# Patient Record
Sex: Female | Born: 1970 | Race: White | Hispanic: Yes | Marital: Single | Smoking: Never smoker
Health system: Southern US, Community
[De-identification: ages and names within clinical notes are randomized; demographics above are authoritative.]

## PROBLEM LIST (undated history)

## (undated) DIAGNOSIS — D259 Leiomyoma of uterus, unspecified: Secondary | ICD-10-CM

## (undated) DIAGNOSIS — E785 Hyperlipidemia, unspecified: Secondary | ICD-10-CM

## (undated) DIAGNOSIS — K219 Gastro-esophageal reflux disease without esophagitis: Secondary | ICD-10-CM

## (undated) DIAGNOSIS — I10 Essential (primary) hypertension: Secondary | ICD-10-CM

## (undated) DIAGNOSIS — N200 Calculus of kidney: Secondary | ICD-10-CM

## (undated) HISTORY — DX: Calculus of kidney: N20.0

## (undated) HISTORY — DX: Leiomyoma of uterus, unspecified: D25.9

## (undated) HISTORY — DX: Hyperlipidemia, unspecified: E78.5

## (undated) HISTORY — DX: Gastro-esophageal reflux disease without esophagitis: K21.9

---

## 2007-12-11 ENCOUNTER — Ambulatory Visit (HOSPITAL_COMMUNITY): Admission: RE | Admit: 2007-12-11 | Discharge: 2007-12-11 | Payer: Self-pay | Admitting: Family Medicine

## 2009-02-04 ENCOUNTER — Emergency Department (HOSPITAL_COMMUNITY): Admission: EM | Admit: 2009-02-04 | Discharge: 2009-02-04 | Payer: Self-pay | Admitting: Emergency Medicine

## 2010-01-31 ENCOUNTER — Emergency Department (HOSPITAL_COMMUNITY): Admission: EM | Admit: 2010-01-31 | Discharge: 2010-02-01 | Payer: Self-pay | Admitting: Emergency Medicine

## 2010-04-06 ENCOUNTER — Ambulatory Visit (HOSPITAL_COMMUNITY): Admission: RE | Admit: 2010-04-06 | Discharge: 2010-04-06 | Payer: Self-pay | Admitting: Family Medicine

## 2011-03-13 LAB — URINE MICROSCOPIC-ADD ON

## 2011-03-13 LAB — URINALYSIS, ROUTINE W REFLEX MICROSCOPIC
Bilirubin Urine: NEGATIVE
Glucose, UA: NEGATIVE mg/dL
Nitrite: NEGATIVE
pH: 5.5 (ref 5.0–8.0)

## 2012-12-02 ENCOUNTER — Other Ambulatory Visit (HOSPITAL_COMMUNITY): Payer: Self-pay | Admitting: Nurse Practitioner

## 2012-12-02 DIAGNOSIS — Z139 Encounter for screening, unspecified: Secondary | ICD-10-CM

## 2012-12-07 ENCOUNTER — Ambulatory Visit (HOSPITAL_COMMUNITY): Payer: Self-pay

## 2012-12-07 ENCOUNTER — Ambulatory Visit (HOSPITAL_COMMUNITY)
Admission: RE | Admit: 2012-12-07 | Discharge: 2012-12-07 | Disposition: A | Discharge status: Home / Self Care | Payer: PRIVATE HEALTH INSURANCE | Source: Ambulatory Visit | Attending: Nurse Practitioner | Admitting: Nurse Practitioner

## 2012-12-07 DIAGNOSIS — Z139 Encounter for screening, unspecified: Secondary | ICD-10-CM

## 2012-12-11 ENCOUNTER — Other Ambulatory Visit: Payer: Self-pay | Admitting: Nurse Practitioner

## 2012-12-11 DIAGNOSIS — R928 Other abnormal and inconclusive findings on diagnostic imaging of breast: Secondary | ICD-10-CM

## 2013-01-06 ENCOUNTER — Other Ambulatory Visit (HOSPITAL_COMMUNITY): Payer: Self-pay | Admitting: Nurse Practitioner

## 2013-01-06 ENCOUNTER — Ambulatory Visit (HOSPITAL_COMMUNITY)
Admission: RE | Admit: 2013-01-06 | Discharge: 2013-01-06 | Disposition: A | Discharge status: Home / Self Care | Payer: PRIVATE HEALTH INSURANCE | Source: Ambulatory Visit | Attending: Nurse Practitioner | Admitting: Nurse Practitioner

## 2013-01-06 DIAGNOSIS — R928 Other abnormal and inconclusive findings on diagnostic imaging of breast: Secondary | ICD-10-CM

## 2013-01-13 ENCOUNTER — Encounter (HOSPITAL_COMMUNITY): Payer: Self-pay

## 2016-10-20 ENCOUNTER — Emergency Department (HOSPITAL_COMMUNITY): Payer: Managed Care, Other (non HMO)

## 2016-10-20 ENCOUNTER — Encounter (HOSPITAL_COMMUNITY): Payer: Self-pay

## 2016-10-20 ENCOUNTER — Emergency Department (HOSPITAL_COMMUNITY)
Admission: EM | Admit: 2016-10-20 | Discharge: 2016-10-20 | Disposition: A | Discharge status: Home / Self Care | Payer: Managed Care, Other (non HMO) | Attending: Emergency Medicine | Admitting: Emergency Medicine

## 2016-10-20 DIAGNOSIS — R51 Headache: Secondary | ICD-10-CM | POA: Diagnosis present

## 2016-10-20 DIAGNOSIS — R05 Cough: Secondary | ICD-10-CM | POA: Insufficient documentation

## 2016-10-20 DIAGNOSIS — I10 Essential (primary) hypertension: Secondary | ICD-10-CM | POA: Insufficient documentation

## 2016-10-20 DIAGNOSIS — R059 Cough, unspecified: Secondary | ICD-10-CM

## 2016-10-20 DIAGNOSIS — R519 Headache, unspecified: Secondary | ICD-10-CM

## 2016-10-20 HISTORY — DX: Essential (primary) hypertension: I10

## 2016-10-20 LAB — CBC WITH DIFFERENTIAL/PLATELET
Basophils Absolute: 0 10*3/uL (ref 0.0–0.1)
Basophils Relative: 0 %
EOS ABS: 0.1 10*3/uL (ref 0.0–0.7)
EOS PCT: 2 %
HCT: 39.2 % (ref 36.0–46.0)
Hemoglobin: 13.2 g/dL (ref 12.0–15.0)
LYMPHS ABS: 2.6 10*3/uL (ref 0.7–4.0)
LYMPHS PCT: 38 %
MCH: 29.9 pg (ref 26.0–34.0)
MCHC: 33.7 g/dL (ref 30.0–36.0)
MCV: 88.7 fL (ref 78.0–100.0)
MONO ABS: 0.5 10*3/uL (ref 0.1–1.0)
Monocytes Relative: 8 %
Neutro Abs: 3.7 10*3/uL (ref 1.7–7.7)
Neutrophils Relative %: 52 %
PLATELETS: 224 10*3/uL (ref 150–400)
RBC: 4.42 MIL/uL (ref 3.87–5.11)
RDW: 13.2 % (ref 11.5–15.5)
WBC: 6.9 10*3/uL (ref 4.0–10.5)

## 2016-10-20 LAB — BASIC METABOLIC PANEL
Anion gap: 8 (ref 5–15)
BUN: 9 mg/dL (ref 6–20)
CALCIUM: 9.2 mg/dL (ref 8.9–10.3)
CO2: 25 mmol/L (ref 22–32)
CREATININE: 0.67 mg/dL (ref 0.44–1.00)
Chloride: 105 mmol/L (ref 101–111)
GFR calc Af Amer: >60 mL/min (ref >60)
GLUCOSE: 107 mg/dL — AB (ref 65–99)
Potassium: 3.8 mmol/L (ref 3.5–5.1)
SODIUM: 138 mmol/L (ref 135–145)

## 2016-10-20 MED ORDER — PREDNISONE 20 MG PO TABS
60.0000 mg | ORAL_TABLET | ORAL | Status: AC
  Administered 2016-10-20: 60 mg via ORAL
  Filled 2016-10-20: qty 3

## 2016-10-20 MED ORDER — PREDNISONE 20 MG PO TABS: 40.0000 mg | ORAL_TABLET | Freq: Every day | ORAL | 0 refills | Status: DC

## 2016-10-20 NOTE — ED Triage Notes (Signed)
Pt complaining of dry cough x 2 weeks. Pt also complaining of headache due to htn. Pt with taking htn medication x 3 months, no improvement. Pt complaining of ringing in ears and occasional dizziness.

## 2016-10-20 NOTE — Discharge Instructions (Signed)
As discussed, your evaluation today has been largely reassuring.  But, it is important that you monitor your condition carefully, and do not hesitate to return to the ED if you develop new, or concerning changes in your condition. ? ?Otherwise, please follow-up with your physician for appropriate ongoing care. ? ?

## 2016-10-20 NOTE — ED Provider Notes (Signed)
[facility] DEPT Provider Note   CSN: [id number] Arrival date & time: 10/20/16  1845     History   Chief Complaint Chief Complaint  Patient presents with  . Hypertension  . Headache    HPI Sylvia Nguyen is a 45 y.o. female.  HPI  Reason presents with multiple complaints. She notes that she was generally well until about 3 months ago. About that time she started feeling generalized complaints including full sensation in her head, ears, generalized discomfort. Over the past 2 weeks she has also developed cough, persistent. No fever, no syncope, no chest pain. Mild associated nausea, but no vomiting. Patient went to an urgent care last week, was diagnosed with possible bronchitis. Patient also has become aware of persistently elevated blood pressure. During her last urgent care visit she was started on hydrochlorothiazide. She states that she takes this regularly. Patient is here with 2 children who assist with history of present illness, corroborate the history. Patient also has a notable change of recent arrival here from [place] one week ago. Patient denies other medical problems.   Past Medical History:  Diagnosis Date  . Hypertension     There are no active problems to display for this patient.   History reviewed. No pertinent surgical history.  OB History    No data available       Home Medications    Prior to Admission medications   Not on File    Family History History reviewed. No pertinent family history.  Social History Social History  Substance Use Topics  . Smoking status: Never Smoker  . Smokeless tobacco: Never Used  . Alcohol use No     Allergies   Review of patient's allergies indicates not on file.   Review of Systems Review of Systems  Constitutional:       Per HPI, otherwise negative  HENT:       Per HPI, otherwise negative  Respiratory: Positive for cough. Negative for shortness of breath.   Cardiovascular:   Per HPI, otherwise negative  Gastrointestinal: Negative for vomiting.  Endocrine:       Negative aside from HPI  Genitourinary:       Neg aside from HPI   Musculoskeletal:       Per HPI, otherwise negative  Skin: Negative.   Neurological: Positive for headaches. Negative for syncope.     Physical Exam Updated Vital Signs BP 126/77   Pulse (!) 55   Temp 98.3 F (36.8 C) (Oral)   Resp 20   SpO2 100%   Physical Exam  Constitutional: She is oriented to person, place, and time. She appears well-developed and well-nourished. No distress.  HENT:  Head: Normocephalic and atraumatic.  Right Ear: Hearing, tympanic membrane and ear canal normal.  Left Ear: Hearing, tympanic membrane and ear canal normal.  Eyes: Conjunctivae and EOM are normal.  Cardiovascular: Normal rate and regular rhythm.   Pulmonary/Chest: No stridor. She has decreased breath sounds.  Abdominal: She exhibits no distension.  Musculoskeletal: She exhibits no edema.  Neurological: She is alert and oriented to person, place, and time. No cranial nerve deficit.  Skin: Skin is warm and dry.  Psychiatric: She has a normal mood and affect.  Nursing note and vitals reviewed.    ED Treatments / Results  Labs (all labs ordered are listed, but only abnormal results are displayed) Labs Reviewed  CBC WITH DIFFERENTIAL/PLATELET  BASIC METABOLIC PANEL    EKG  EKG Interpretation  Date/Time:  Sunday  October 20 2016 19:37:38 EDT Ventricular Rate:  59 PR Interval:    QRS Duration: 100 QT Interval:  436 QTC Calculation: 432 R Axis:   65 Text Interpretation:  Sinus rhythm Baseline wander Borderline ECG Confirmed by Carmin Muskrat  MD ([id number]) on 10/20/2016 8:07:46 PM       Radiology No results found.  Procedures Procedures (including critical care time)  Medications Ordered in ED Medications - No data to display   Initial Impression / Assessment and Plan / ED Course  I have reviewed the triage vital signs  and the nursing notes.  Pertinent labs & imaging results that were available during my care of the patient were reviewed by me and considered in my medical decision making (see chart for details).  Clinical Course    10:15 PM On repeat exam the patient is in no distress, awake, alert, blood pressure normal. I discussed all findings the patient and her children. Discussed the reassuring labs, EKG, x-ray, normal blood pressure. Patient has no primary care physician, but I emphasized the importance of following up with our referral to make an appointment for later this week after taking medication for the next few days. This patient presents with ongoing multiple complaints. Patient has had ongoing cough, there is some suspicion for bronchitis, but no evidence for pneumonia on x-ray. Here the patient is no evidence for end organ effects of hypertension, and blood pressure normalizes during her hospital stay. Patient is otherwise well-appearing, with no neurologic deficits, low suspicion for other acute new pathology. Patient started on a short course of steroids for possible bronchitis, discharge with references to follow-up with primary care.     Carmin Muskrat, MD 10/20/16 2216

## 2016-10-20 NOTE — ED Notes (Signed)
Taken to xray at this time. 

## 2017-01-01 ENCOUNTER — Telehealth (HOSPITAL_COMMUNITY): Payer: Self-pay | Admitting: Family Medicine

## 2017-01-02 NOTE — Telephone Encounter (Signed)
01/01/17 Called pt and lmsg for her to Cb to get scheduled for an echo     By Verdene Rio

## 2017-01-06 ENCOUNTER — Telehealth (HOSPITAL_COMMUNITY): Payer: Self-pay | Admitting: Family Medicine

## 2017-01-16 NOTE — Telephone Encounter (Signed)
01/06/17 Left Message - Called pt and lmsg for her to CB to get scheduled for echo.     By Verdene Rio

## 2017-01-17 ENCOUNTER — Telehealth (HOSPITAL_COMMUNITY): Payer: Self-pay | Admitting: Family Medicine

## 2017-01-17 NOTE — Telephone Encounter (Signed)
01/17/17 Left Message - Called pt and lmsg for her to CB.Marland KitchenRG     By Verdene Rio

## 2017-06-15 ENCOUNTER — Encounter (HOSPITAL_COMMUNITY): Payer: Self-pay

## 2017-06-15 DIAGNOSIS — I10 Essential (primary) hypertension: Secondary | ICD-10-CM | POA: Insufficient documentation

## 2017-06-15 DIAGNOSIS — Z79899 Other long term (current) drug therapy: Secondary | ICD-10-CM | POA: Insufficient documentation

## 2017-06-15 DIAGNOSIS — M5432 Sciatica, left side: Secondary | ICD-10-CM | POA: Insufficient documentation

## 2017-06-15 NOTE — ED Triage Notes (Signed)
Pt complaining of lower back pain that radiates down L leg x 1 week. Pt states some numbness/tingling. Pt denies any injury/trauma.

## 2017-06-16 ENCOUNTER — Emergency Department (HOSPITAL_COMMUNITY)
Admission: EM | Admit: 2017-06-16 | Discharge: 2017-06-16 | Disposition: A | Discharge status: Home / Self Care | Payer: Self-pay | Attending: Emergency Medicine | Admitting: Emergency Medicine

## 2017-06-16 ENCOUNTER — Emergency Department (HOSPITAL_COMMUNITY): Payer: Self-pay

## 2017-06-16 ENCOUNTER — Encounter (HOSPITAL_COMMUNITY): Payer: Self-pay | Admitting: Emergency Medicine

## 2017-06-16 DIAGNOSIS — R531 Weakness: Secondary | ICD-10-CM | POA: Insufficient documentation

## 2017-06-16 DIAGNOSIS — M5432 Sciatica, left side: Secondary | ICD-10-CM

## 2017-06-16 DIAGNOSIS — R52 Pain, unspecified: Secondary | ICD-10-CM

## 2017-06-16 DIAGNOSIS — Z79899 Other long term (current) drug therapy: Secondary | ICD-10-CM | POA: Insufficient documentation

## 2017-06-16 DIAGNOSIS — I10 Essential (primary) hypertension: Secondary | ICD-10-CM | POA: Insufficient documentation

## 2017-06-16 DIAGNOSIS — R935 Abnormal findings on diagnostic imaging of other abdominal regions, including retroperitoneum: Secondary | ICD-10-CM | POA: Insufficient documentation

## 2017-06-16 DIAGNOSIS — M549 Dorsalgia, unspecified: Secondary | ICD-10-CM

## 2017-06-16 DIAGNOSIS — F419 Anxiety disorder, unspecified: Secondary | ICD-10-CM | POA: Insufficient documentation

## 2017-06-16 DIAGNOSIS — M5417 Radiculopathy, lumbosacral region: Secondary | ICD-10-CM | POA: Insufficient documentation

## 2017-06-16 LAB — COMPREHENSIVE METABOLIC PANEL
ALBUMIN: 3.7 g/dL (ref 3.5–5.0)
ALK PHOS: 69 U/L (ref 38–126)
ALT: 14 U/L (ref 14–54)
ANION GAP: 10 (ref 5–15)
AST: 25 U/L (ref 15–41)
BILIRUBIN TOTAL: 0.6 mg/dL (ref 0.3–1.2)
BUN: 10 mg/dL (ref 6–20)
CO2: 24 mmol/L (ref 22–32)
Calcium: 8.1 mg/dL — ABNORMAL LOW (ref 8.9–10.3)
Chloride: 102 mmol/L (ref 101–111)
Creatinine, Ser: 0.62 mg/dL (ref 0.44–1.00)
GFR calc non Af Amer: >60 mL/min (ref >60)
GLUCOSE: 120 mg/dL — AB (ref 65–99)
POTASSIUM: 3.3 mmol/L — AB (ref 3.5–5.1)
SODIUM: 136 mmol/L (ref 135–145)
TOTAL PROTEIN: 6.5 g/dL (ref 6.5–8.1)

## 2017-06-16 LAB — I-STAT CHEM 8, ED
BUN: 13 mg/dL (ref 6–20)
CHLORIDE: 101 mmol/L (ref 101–111)
Calcium, Ion: 1.11 mmol/L — ABNORMAL LOW (ref 1.15–1.40)
Creatinine, Ser: 0.6 mg/dL (ref 0.44–1.00)
Glucose, Bld: 114 mg/dL — ABNORMAL HIGH (ref 65–99)
HEMATOCRIT: 34 % — AB (ref 36.0–46.0)
Hemoglobin: 11.6 g/dL — ABNORMAL LOW (ref 12.0–15.0)
POTASSIUM: 3.8 mmol/L (ref 3.5–5.1)
SODIUM: 139 mmol/L (ref 135–145)
TCO2: 26 mmol/L (ref 0–100)

## 2017-06-16 LAB — CBC WITH DIFFERENTIAL/PLATELET
BASOS PCT: 0 %
Basophils Absolute: 0 10*3/uL (ref 0.0–0.1)
EOS ABS: 0 10*3/uL (ref 0.0–0.7)
Eosinophils Relative: 1 %
HCT: 35.8 % — ABNORMAL LOW (ref 36.0–46.0)
Hemoglobin: 12.2 g/dL (ref 12.0–15.0)
LYMPHS ABS: 1.6 10*3/uL (ref 0.7–4.0)
LYMPHS PCT: 26 %
MCH: 29.7 pg (ref 26.0–34.0)
MCHC: 34.1 g/dL (ref 30.0–36.0)
MCV: 87.1 fL (ref 78.0–100.0)
MONO ABS: 0.3 10*3/uL (ref 0.1–1.0)
MONOS PCT: 5 %
NEUTROS ABS: 4.3 10*3/uL (ref 1.7–7.7)
Neutrophils Relative %: 68 %
Platelets: 209 10*3/uL (ref 150–400)
RBC: 4.11 MIL/uL (ref 3.87–5.11)
RDW: 12.6 % (ref 11.5–15.5)
WBC: 6.2 10*3/uL (ref 4.0–10.5)

## 2017-06-16 LAB — URINALYSIS, ROUTINE W REFLEX MICROSCOPIC
Bilirubin Urine: NEGATIVE
GLUCOSE, UA: NEGATIVE mg/dL
Hgb urine dipstick: NEGATIVE
KETONES UR: NEGATIVE mg/dL
LEUKOCYTES UA: NEGATIVE
Nitrite: NEGATIVE
PROTEIN: NEGATIVE mg/dL
Specific Gravity, Urine: 1.004 — ABNORMAL LOW (ref 1.005–1.030)
pH: 7 (ref 5.0–8.0)

## 2017-06-16 LAB — I-STAT TROPONIN, ED: Troponin i, poc: 0 ng/mL (ref 0.00–0.08)

## 2017-06-16 LAB — I-STAT BETA HCG BLOOD, ED (MC, WL, AP ONLY)

## 2017-06-16 LAB — PREGNANCY, URINE: Preg Test, Ur: NEGATIVE

## 2017-06-16 LAB — LIPASE, BLOOD: Lipase: 26 U/L (ref 11–51)

## 2017-06-16 MED ORDER — KETOROLAC TROMETHAMINE 30 MG/ML IJ SOLN
30.0000 mg | Freq: Once | INTRAMUSCULAR | Status: AC
  Administered 2017-06-16: 30 mg via INTRAVENOUS
  Filled 2017-06-16: qty 1

## 2017-06-16 MED ORDER — PROMETHAZINE HCL 25 MG/ML IJ SOLN
25.0000 mg | Freq: Once | INTRAMUSCULAR | Status: AC
  Administered 2017-06-16: 25 mg via INTRAVENOUS
  Filled 2017-06-16: qty 1

## 2017-06-16 MED ORDER — ONDANSETRON HCL 4 MG/2ML IJ SOLN
4.0000 mg | Freq: Once | INTRAMUSCULAR | Status: AC
  Administered 2017-06-16: 4 mg via INTRAVENOUS
  Filled 2017-06-16: qty 2

## 2017-06-16 MED ORDER — PREDNISONE 20 MG PO TABS: 40.0000 mg | ORAL_TABLET | Freq: Every day | ORAL | 0 refills | Status: DC

## 2017-06-16 MED ORDER — SODIUM CHLORIDE 0.9 % IV BOLUS (SEPSIS)
1000.0000 mL | Freq: Once | INTRAVENOUS | Status: AC
  Administered 2017-06-16: 1000 mL via INTRAVENOUS

## 2017-06-16 MED ORDER — CYCLOBENZAPRINE HCL 10 MG PO TABS: 10.0000 mg | ORAL_TABLET | Freq: Two times a day (BID) | ORAL | 0 refills | Status: DC | PRN

## 2017-06-16 MED ORDER — PROMETHAZINE HCL 25 MG PO TABS: 25.0000 mg | ORAL_TABLET | Freq: Four times a day (QID) | ORAL | 0 refills | Status: DC | PRN

## 2017-06-16 MED ORDER — LORAZEPAM 2 MG/ML IJ SOLN
0.5000 mg | Freq: Once | INTRAMUSCULAR | Status: AC
  Administered 2017-06-16: 0.5 mg via INTRAVENOUS
  Filled 2017-06-16: qty 1

## 2017-06-16 MED ORDER — IOPAMIDOL (ISOVUE-300) INJECTION 61%
INTRAVENOUS | Status: AC
  Administered 2017-06-16: 100 mL
  Filled 2017-06-16: qty 100

## 2017-06-16 MED ORDER — HYDROCODONE-ACETAMINOPHEN 5-325 MG PO TABS
2.0000 | ORAL_TABLET | Freq: Once | ORAL | Status: AC
  Administered 2017-06-16: 2 via ORAL
  Filled 2017-06-16: qty 2

## 2017-06-16 MED ORDER — CYCLOBENZAPRINE HCL 5 MG PO TABS: 5.0000 mg | ORAL_TABLET | Freq: Three times a day (TID) | ORAL | 0 refills | Status: DC | PRN

## 2017-06-16 NOTE — ED Triage Notes (Signed)
Pt brought to ED by [facility] for c/o cp, HA and SOB, pt just dc home few hours ago for back pain, when requesting on a pain scale how is her pain, pt refuses to answer and just started crying, CBG by EMS 117, HR 60, BP 168/82, SPO2 100% on RA. Pt is spanish speaker only, resting on bed NAD noticed.

## 2017-06-16 NOTE — ED Provider Notes (Signed)
[facility] DEPT Provider Note   CSN: [id number] Arrival date & time: 06/16/17  0442     History   Chief Complaint Chief Complaint  Patient presents with  . Chest Pain  . Weakness  . Headache    HPI Sylvia Nguyen is a 46 y.o. female.  The history is provided by the patient and a relative. A language interpreter was used [id number]).  Patient presents for re-evaluation History obtained via spanish interpreter video as well as daughter Pt was seen in the ED in the night of 6/24 (several hrs ago) for back pain She was given vicodin, felt improved and was discharged Soon after leaving, she started having nausea/vomiting She reports her whole body became numb She reports shortness of breath She report her heart "hurt"  She reports generalized weakness She reports tinnitus No syncope No falls No incontinence She had been seen for low back pain which improved She thinks it may have been related to the meds she was given in the ED  Past Medical History:  Diagnosis Date  . Hypertension     There are no active problems to display for this patient.   History reviewed. No pertinent surgical history.  OB History    No data available       Home Medications    Prior to Admission medications   Medication Sig Start Date End Date Taking? Authorizing Provider  cyclobenzaprine (FLEXERIL) 10 MG tablet Take 1 tablet (10 mg total) by mouth 2 (two) times daily as needed for muscle spasms. 06/16/17   Montine Circle, PA-C  predniSONE (DELTASONE) 20 MG tablet Take 2 tablets (40 mg total) by mouth daily. 06/16/17   Montine Circle, PA-C    Family History History reviewed. No pertinent family history.  Social History Social History  Substance Use Topics  . Smoking status: Never Smoker  . Smokeless tobacco: Never Used  . Alcohol use No     Allergies   Patient has no known allergies.   Review of Systems Review of Systems  Constitutional: Negative for fever.  HENT:  Positive for tinnitus.   Gastrointestinal: Positive for nausea and vomiting.  Psychiatric/Behavioral: The patient is nervous/anxious.   All other systems reviewed and are negative.    Physical Exam Updated Vital Signs BP 115/75   Pulse 63   Temp 98.5 F (36.9 C) (Oral)   Resp (!) 22   Ht 1.575 m (5\' 2" )   Wt 61.2 kg (135 lb)   SpO2 100%   BMI 24.69 kg/m   Physical Exam CONSTITUTIONAL: mildly anxious HEAD: Normocephalic/atraumatic EYES: EOMI/PERRL ENMT: Mucous membranes moist NECK: supple no meningeal signs SPINE/BACK:entire spine nontender CV: S1/S2 noted, no murmurs/rubs/gallops noted LUNGS: Lungs are clear to auscultation bilaterally, no apparent distress ABDOMEN: soft, nontender, no rebound or guarding, bowel sounds noted throughout abdomen GU:no cva tenderness NEURO: Pt is awake/alert/appropriate, moves all extremitiesx4.  No facial droop.  No arm/leg drift EXTREMITIES: pulses normal/equal, full ROM SKIN: warm, color normal PSYCH: anxious  ED Treatments / Results  Labs (all labs ordered are listed, but only abnormal results are displayed) Labs Reviewed  I-STAT CHEM 8, ED - Abnormal; Notable for the following:       Result Value   Glucose, Bld 114 (*)    Calcium, Ion 1.11 (*)    Hemoglobin 11.6 (*)    HCT 34.0 (*)    All other components within normal limits  Randolm Idol, ED    EKG  EKG Interpretation  Date/Time:  Monday  June 16 2017 05:04:02 EDT Ventricular Rate:  61 PR Interval:    QRS Duration: 109 QT Interval:  452 QTC Calculation: 456 R Axis:   73 Text Interpretation:  Sinus rhythm RSR' in V1 or V2, right VCD or RVH No significant change since last tracing Confirmed by Ripley Fraise [phone]) on 06/16/2017 5:10:13 AM       Radiology Dg Hip Unilat With Pelvis 2-3 Views Left  Result Date: 06/16/2017 CLINICAL DATA:  Lower back and left-sided hip pain tonight. No trauma. EXAM: DG HIP (WITH OR WITHOUT PELVIS) 2-3V LEFT COMPARISON:  None.  FINDINGS: There is no evidence of hip fracture or dislocation. There is no evidence of arthropathy or other focal bone abnormality. IMPRESSION: Negative. Electronically Signed   By: Andreas Newport M.D.   On: 06/16/2017 02:27    Procedures Procedures   Medications Ordered in ED Medications  sodium chloride 0.9 % bolus 1,000 mL (not administered)  ondansetron (ZOFRAN) injection 4 mg (not administered)  ketorolac (TORADOL) 30 MG/ML injection 30 mg (not administered)  ondansetron (ZOFRAN) injection 4 mg (4 mg Intravenous Given 06/16/17 0636)  sodium chloride 0.9 % bolus 1,000 mL (1,000 mLs Intravenous New Bag/Given 06/16/17 0635)  LORazepam (ATIVAN) injection 0.5 mg (0.5 mg Intravenous Given 06/16/17 0636)     Initial Impression / Assessment and Plan / ED Course  I have reviewed the triage vital signs and the nursing notes.  Pertinent labs results that were available during my care of the patient were reviewed by me and considered in my medical decision making (see chart for details).     7:20 AM Pt with medication reaction after receiving vicodin on previous ED visit She had vomiting/felt anxious/short of breath/body numbness on the car ride home She did not respond to initial meds She is still holding emesis bag Will give another round of meds/fluids She reports back pain is actually better and no red flags on exam  At signout to dr Darl Householder, have patient take PO If she can ambulate/take PO she will be stable for d/c home Would advise to not take prednisone/flexeril   Final Clinical Impressions(s) / ED Diagnoses   Final diagnoses:  Anxiety  Generalized weakness    New Prescriptions New Prescriptions   No medications on file     Ripley Fraise, MD 06/16/17 [phone]

## 2017-06-16 NOTE — ED Provider Notes (Signed)
[facility] DEPT Provider Note   CSN: [id number] Arrival date & time: 06/15/17  2315     History   Chief Complaint Chief Complaint  Patient presents with  . Back Pain    HPI Sylvia Nguyen is a 46 y.o. female.  Patient presents to the emergency department with chief complaint of left hip pain. She states symptoms started about a week ago. She states pain is in her left buttock and radiates down the back of her leg. She denies any associated fevers, or chills. She reports no bowel or bladder incontinence. Denies any saddle anesthesia. She states that when the pain is severe, she is unable to walk because of pain. She has never had this problem before. She denies any falls or traumatic injuries. There are no other associated symptoms.   The history is provided by the patient. No language interpreter was used.    Past Medical History:  Diagnosis Date  . Hypertension     There are no active problems to display for this patient.   History reviewed. No pertinent surgical history.  OB History    No data available       Home Medications    Prior to Admission medications   Medication Sig Start Date End Date Taking? Authorizing Provider  predniSONE (DELTASONE) 20 MG tablet Take 2 tablets (40 mg total) by mouth daily with breakfast. For the next four days 10/20/16   Carmin Muskrat, MD    Family History History reviewed. No pertinent family history.  Social History Social History  Substance Use Topics  . Smoking status: Never Smoker  . Smokeless tobacco: Never Used  . Alcohol use No     Allergies   Patient has no allergy information on record.   Review of Systems Review of Systems  Constitutional: Negative for chills and fever.  Gastrointestinal:       No bowel incontinence  Genitourinary:       No urinary incontinence  Musculoskeletal: Positive for arthralgias, back pain and myalgias.  Neurological:       No saddle anesthesia     Physical  Exam Updated Vital Signs BP (!) 149/78 (BP Location: Right Arm)   Pulse (!) 53   Temp 98.5 F (36.9 C) (Oral)   Resp 17   SpO2 100%   Physical Exam  Physical Exam  Constitutional: Pt appears well-developed and well-nourished. No distress.  HENT:  Head: Normocephalic and atraumatic.  Mouth/Throat: Oropharynx is clear and moist. No oropharyngeal exudate.  Eyes: Conjunctivae are normal.  Neck: Normal range of motion. Neck supple.  No meningismus Cardiovascular: Normal rate, regular rhythm and intact distal pulses.   Pulmonary/Chest: Effort normal and breath sounds normal. No respiratory distress. Pt has no wheezes.  Abdominal: Pt exhibits no distension Musculoskeletal:  Left lumbar tender to palpation, no bony CTLS spine tenderness, deformity, step-off, or crepitus Lymphadenopathy: Pt has no cervical adenopathy.  Neurological: Pt is alert and oriented Speech is clear and goal oriented, follows commands Normal 5/5 strength in upper and lower extremities bilaterally including dorsiflexion and plantar flexion, strong and equal grip strength Sensation intact Great toe extension intact Moves extremities without ataxia, coordination intact Antalgic gait Normal balance No Clonus Skin: Skin is warm and dry. No rash noted. Pt is not diaphoretic. No erythema.  Psychiatric: Pt has a normal mood and affect. Behavior is normal.  Nursing note and vitals reviewed.    ED Treatments / Results  Labs (all labs ordered are listed, but only abnormal results  are displayed) Labs Reviewed  URINALYSIS, ROUTINE W REFLEX MICROSCOPIC  PREGNANCY, URINE    EKG  EKG Interpretation None       Radiology Dg Hip Unilat With Pelvis 2-3 Views Left  Result Date: 06/16/2017 CLINICAL DATA:  Lower back and left-sided hip pain tonight. No trauma. EXAM: DG HIP (WITH OR WITHOUT PELVIS) 2-3V LEFT COMPARISON:  None. FINDINGS: There is no evidence of hip fracture or dislocation. There is no evidence of  arthropathy or other focal bone abnormality. IMPRESSION: Negative. Electronically Signed   By: Andreas Newport M.D.   On: 06/16/2017 02:27    Procedures Procedures (including critical care time)  Medications Ordered in ED Medications - No data to display   Initial Impression / Assessment and Plan / ED Course  I have reviewed the triage vital signs and the nursing notes.  Pertinent labs & imaging results that were available during my care of the patient were reviewed by me and considered in my medical decision making (see chart for details).     Patient with symptoms that seem consistent with sciatica. I discussed my treatment plan with the patient, however she insisted that she get an x-ray of her hip, stating that she is convinced she has something wrong with the bone in her hip. I explained that I felt that x-ray would likely be low yield, but will perform x-ray is requested.  Patient with back pain.  No neurological deficits and normal neuro exam.  Patient is ambulatory.  No loss of bowel or bladder control.  Doubt cauda equina.  Denies fever,  doubt epidural abscess or other lesion. Recommend back exercises, stretching, RICE, and will treat with a short course of prednisone and flexeril.  Encouraged the patient that there could be a need for additional workup and/or imaging such as MRI, if the symptoms do not resolve. Patient advised that if the back pain does not resolve, or radiates, this could progress to more serious conditions and is encouraged to follow-up with PCP or orthopedics within 2 weeks.     Final Clinical Impressions(s) / ED Diagnoses   Final diagnoses:  Sciatica of left side    New Prescriptions New Prescriptions   CYCLOBENZAPRINE (FLEXERIL) 10 MG TABLET    Take 1 tablet (10 mg total) by mouth 2 (two) times daily as needed for muscle spasms.   PREDNISONE (DELTASONE) 20 MG TABLET    Take 2 tablets (40 mg total) by mouth daily.     Montine Circle,  PA-C 06/16/17 0247    Ripley Fraise, MD 06/17/17 [phone]

## 2017-06-16 NOTE — ED Notes (Signed)
Patient transported to CT 

## 2017-06-16 NOTE — Discharge Instructions (Signed)
Take phenergan as needed for nausea or vomiting or headaches.   Stay hydrated.   Take prednisone as prescribed.   Take flexeril for muscle spasms.  See Dr. Annette Stable for follow up. You have a pinched nerve in your back.   Return to ER if you have worse headaches, vomiting, weakness, numbness

## 2017-06-16 NOTE — ED Provider Notes (Signed)
  Physical Exam  BP 131/75   Pulse 84   Temp 98.5 F (36.9 C) (Oral)   Resp (!) 23   Ht 5\' 2"  (1.575 m)   Wt 61.2 kg (135 lb)   SpO2 100%   BMI 24.69 kg/m   Physical Exam  ED Course  Procedures  MDM Care assumed at 7:30 am. Patient was seen last night for L hip pain and was thought to have lumbar radiculopathy. Given vicodin in the ED and was prescribed prednisone and flexeril but didn't fill prescription. Came back early this morning for headaches, vomiting, abdominal pain. Patient was unable to tolerate PO at sign out and sign out pending reassessment.   8 am Given IV zofran, IVF, still nauseated. Mild L CVAT and LUQ tenderness. Patient had UA and BMP normal last night. Will repeat CBC, CMP, Lipase. Will try phenergan.    9 am Still nauseated and has headaches and back pain. Will get CT head, CT ab/pel with lumbar recon.   11:25 AM Patient's CT head unremarkable. 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 I doubt mesenteric ischemia. CT lumbar showed L4-5 disc bulging. She has no saddle anesthesia. Nl gait in the ED. No weakness. No need for MRI currently. She is able to tolerate PO in the ED. I think she likely had adverse reaction to vicodin from earlier. Will add phenergan for nausea. Will have her continue prednisone and flexeril as prescribed earlier in the night.       Drenda Freeze, MD 06/16/17 [phone]

## 2017-06-18 ENCOUNTER — Emergency Department (HOSPITAL_COMMUNITY): Payer: Self-pay

## 2017-06-18 ENCOUNTER — Emergency Department (HOSPITAL_COMMUNITY)
Admission: EM | Admit: 2017-06-18 | Discharge: 2017-06-18 | Disposition: A | Discharge status: Home / Self Care | Payer: Self-pay | Attending: Emergency Medicine | Admitting: Emergency Medicine

## 2017-06-18 ENCOUNTER — Encounter (HOSPITAL_COMMUNITY): Payer: Self-pay | Admitting: Emergency Medicine

## 2017-06-18 DIAGNOSIS — R519 Headache, unspecified: Secondary | ICD-10-CM

## 2017-06-18 DIAGNOSIS — Z79899 Other long term (current) drug therapy: Secondary | ICD-10-CM | POA: Insufficient documentation

## 2017-06-18 DIAGNOSIS — I10 Essential (primary) hypertension: Secondary | ICD-10-CM | POA: Insufficient documentation

## 2017-06-18 DIAGNOSIS — R0789 Other chest pain: Secondary | ICD-10-CM | POA: Insufficient documentation

## 2017-06-18 DIAGNOSIS — R51 Headache: Secondary | ICD-10-CM | POA: Insufficient documentation

## 2017-06-18 LAB — COMPREHENSIVE METABOLIC PANEL
ALBUMIN: 4.1 g/dL (ref 3.5–5.0)
ALK PHOS: 68 U/L (ref 38–126)
ALT: 17 U/L (ref 14–54)
ANION GAP: 9 (ref 5–15)
AST: 22 U/L (ref 15–41)
BILIRUBIN TOTAL: 0.5 mg/dL (ref 0.3–1.2)
BUN: 12 mg/dL (ref 6–20)
CALCIUM: 9 mg/dL (ref 8.9–10.3)
CO2: 26 mmol/L (ref 22–32)
Chloride: 101 mmol/L (ref 101–111)
Creatinine, Ser: 0.7 mg/dL (ref 0.44–1.00)
GLUCOSE: 97 mg/dL (ref 65–99)
POTASSIUM: 3.5 mmol/L (ref 3.5–5.1)
Sodium: 136 mmol/L (ref 135–145)
TOTAL PROTEIN: 7.3 g/dL (ref 6.5–8.1)

## 2017-06-18 LAB — CBC WITH DIFFERENTIAL/PLATELET
BASOS PCT: 0 %
Basophils Absolute: 0 10*3/uL (ref 0.0–0.1)
Eosinophils Absolute: 0.1 10*3/uL (ref 0.0–0.7)
Eosinophils Relative: 1 %
HEMATOCRIT: 35.9 % — AB (ref 36.0–46.0)
HEMOGLOBIN: 12.5 g/dL (ref 12.0–15.0)
LYMPHS ABS: 2.4 10*3/uL (ref 0.7–4.0)
Lymphocytes Relative: 31 %
MCH: 30.4 pg (ref 26.0–34.0)
MCHC: 34.8 g/dL (ref 30.0–36.0)
MCV: 87.3 fL (ref 78.0–100.0)
MONO ABS: 0.5 10*3/uL (ref 0.1–1.0)
MONOS PCT: 7 %
Neutro Abs: 4.6 10*3/uL (ref 1.7–7.7)
Neutrophils Relative %: 61 %
Platelets: 224 10*3/uL (ref 150–400)
RBC: 4.11 MIL/uL (ref 3.87–5.11)
RDW: 12.9 % (ref 11.5–15.5)
WBC: 7.6 10*3/uL (ref 4.0–10.5)

## 2017-06-18 LAB — LIPASE, BLOOD: LIPASE: 29 U/L (ref 11–51)

## 2017-06-18 LAB — I-STAT BETA HCG BLOOD, ED (MC, WL, AP ONLY): I-stat hCG, quantitative: <5 m[IU]/mL (ref <5)

## 2017-06-18 LAB — TROPONIN I

## 2017-06-18 LAB — D-DIMER, QUANTITATIVE (NOT AT ARMC)

## 2017-06-18 MED ORDER — DIPHENHYDRAMINE HCL 50 MG/ML IJ SOLN
25.0000 mg | Freq: Once | INTRAMUSCULAR | Status: AC
  Administered 2017-06-18: 25 mg via INTRAVENOUS
  Filled 2017-06-18: qty 1

## 2017-06-18 MED ORDER — ONDANSETRON HCL 4 MG/2ML IJ SOLN
4.0000 mg | Freq: Once | INTRAMUSCULAR | Status: AC
  Administered 2017-06-18: 4 mg via INTRAVENOUS
  Filled 2017-06-18: qty 2

## 2017-06-18 MED ORDER — ONDANSETRON 4 MG PO TBDP: 4.0000 mg | ORAL_TABLET | Freq: Three times a day (TID) | ORAL | 0 refills | Status: DC | PRN

## 2017-06-18 MED ORDER — SODIUM CHLORIDE 0.9 % IV BOLUS (SEPSIS)
1000.0000 mL | Freq: Once | INTRAVENOUS | Status: AC
  Administered 2017-06-18: 1000 mL via INTRAVENOUS

## 2017-06-18 MED ORDER — KETOROLAC TROMETHAMINE 30 MG/ML IJ SOLN
30.0000 mg | Freq: Once | INTRAMUSCULAR | Status: AC
  Administered 2017-06-18: 30 mg via INTRAVENOUS
  Filled 2017-06-18: qty 1

## 2017-06-18 MED ORDER — METOCLOPRAMIDE HCL 5 MG/ML IJ SOLN
10.0000 mg | Freq: Once | INTRAMUSCULAR | Status: AC
  Administered 2017-06-18: 10 mg via INTRAVENOUS
  Filled 2017-06-18: qty 2

## 2017-06-18 NOTE — ED Triage Notes (Signed)
Pt c/o continued chest pain, emesis, headache and weakness.

## 2017-06-18 NOTE — Discharge Instructions (Signed)
There is no evidence of heart attack or blood clot in the lung. Stop taking prednisone and flexeril. Keep yourself hydrated. Followup with your doctor. Return to the ED if you develop new or worsening symptoms.

## 2017-06-18 NOTE — ED Provider Notes (Signed)
[facility] DEPT Provider Note   CSN: [id number] Arrival date & time: 06/18/17  0232     History   Chief Complaint Chief Complaint  Patient presents with  . Chest Pain    HPI MARNESHA GAGEN is a 46 y.o. female.  Level V caveat for language barrier. Translator used. Patient presents with persistent chest pain, headache and nausea since ED visit 2 days ago. She was initially seen on June 25 for left sided hip pain consistent with sciatica. She was treated with steroids and Flexeril. She returned later that day with abdominal pain nausea and vomiting. She had a workup including CT scan that was reassuring. Since then she's had persistent nausea but no further vomiting. Has slight left-sided chest pain that is been constant as well as gradual onset headache. Denies fever. Denies shortness of breath. Denies abdominal pain or diarrhea. Has not had any further episodes of vomiting. No pain with urination or blood in the urine. She had a CT scan of both her lumbar spine, head and abdomen pelvis. She feels generally weak and her daughter reports she's had some intermittent confusion and has been asking questions repeatedly at home. Denies any falls or head injury. No bowel or bladder incontinence. No focal weakness, numbness, or tingling.    Chest Pain   Associated symptoms include back pain, nausea and weakness. Pertinent negatives include no abdominal pain, no dizziness, no fever, no headaches, no shortness of breath and no vomiting.    Past Medical History:  Diagnosis Date  . Hypertension     There are no active problems to display for this patient.   History reviewed. No pertinent surgical history.  OB History    No data available       Home Medications    Prior to Admission medications   Medication Sig Start Date End Date Taking? Authorizing Provider  cyclobenzaprine (FLEXERIL) 5 MG tablet Take 1 tablet (5 mg total) by mouth 3 (three) times daily as needed for muscle  spasms. 06/16/17   Drenda Freeze, MD  lisinopril-hydrochlorothiazide (PRINZIDE,ZESTORETIC) 10-12.5 MG tablet Take 1 tablet by mouth daily.    [provider]  predniSONE (DELTASONE) 20 MG tablet Take 2 tablets (40 mg total) by mouth daily. 06/16/17   Drenda Freeze, MD  promethazine (PHENERGAN) 25 MG tablet Take 1 tablet (25 mg total) by mouth every 6 (six) hours as needed for nausea or vomiting. 06/16/17   Drenda Freeze, MD    Family History No family history on file.  Social History Social History  Substance Use Topics  . Smoking status: Never Smoker  . Smokeless tobacco: Never Used  . Alcohol use No     Allergies   Patient has no known allergies.   Review of Systems Review of Systems  Constitutional: Positive for activity change and appetite change. Negative for fatigue and fever.  HENT: Negative for congestion.   Respiratory: Positive for chest tightness. Negative for shortness of breath.   Cardiovascular: Positive for chest pain.  Gastrointestinal: Positive for nausea. Negative for abdominal pain, constipation, diarrhea, rectal pain and vomiting.  Genitourinary: Negative for dysuria, hematuria, vaginal bleeding and vaginal discharge.  Musculoskeletal: Positive for arthralgias, back pain and myalgias. Negative for neck pain.  Neurological: Positive for weakness. Negative for dizziness and headaches.    all other systems are negative except as noted in the HPI and PMH.    Physical Exam Updated Vital Signs BP 120/78   Pulse (!) 57   Temp  18 F (36.7 C)   Resp (!) 26   Ht 5\' 2"  (1.575 m)   Wt 61.2 kg (135 lb)   SpO2 100%   BMI 24.69 kg/m   Physical Exam  Constitutional: She is oriented to person, place, and time. She appears well-developed and well-nourished. No distress.  HENT:  Head: Normocephalic and atraumatic.  Mouth/Throat: Oropharynx is clear and moist. No oropharyngeal exudate.  Eyes: Conjunctivae and EOM are normal. Pupils are equal,  round, and reactive to light.  Neck: Normal range of motion. Neck supple.  No meningismus.  Cardiovascular: Normal rate, regular rhythm, normal heart sounds and intact distal pulses.   No murmur heard. Pulmonary/Chest: Effort normal and breath sounds normal. No respiratory distress. She exhibits tenderness.  Abdominal: Soft. There is no tenderness. There is no rebound and no guarding.  Musculoskeletal: Normal range of motion. She exhibits no edema or tenderness.  Neurological: She is alert and oriented to person, place, and time. No cranial nerve deficit. She exhibits normal muscle tone. Coordination normal.   5/5 strength throughout. CN 2-12 intact.Equal grip strength.  +2 patellar reflexes bilaterally  Skin: Skin is warm.  Psychiatric: She has a normal mood and affect. Her behavior is normal.  Nursing note and vitals reviewed.    ED Treatments / Results  Labs (all labs ordered are listed, but only abnormal results are displayed) Labs Reviewed  CBC WITH DIFFERENTIAL/PLATELET - Abnormal; Notable for the following:       Result Value   HCT 35.9 (*)    All other components within normal limits  COMPREHENSIVE METABOLIC PANEL  LIPASE, BLOOD  TROPONIN I  D-DIMER, QUANTITATIVE (NOT AT [facility])  I-STAT BETA HCG BLOOD, ED ([facility], [facility], [facility] ONLY)    EKG  EKG Interpretation  Date/Time:  Wednesday June 18 2017 02:49:28 EDT Ventricular Rate:  59 PR Interval:    QRS Duration: 100 QT Interval:  429 QTC Calculation: 425 R Axis:   68 Text Interpretation:  Sinus rhythm RSR' in V1 or V2, right VCD or RVH No significant change was found Confirmed by Ezequiel Essex [phone]) on 06/18/2017 2:58:08 AM       Radiology Dg Chest 2 View  Result Date: 06/18/2017 CLINICAL DATA:  MID TO LEFT CHEST PAIN RADIATING TO LEFT SHOULDER FOR 2 DAYS. EXAM: EXAM CHEST  2 VIEW COMPARISON:  06/16/2017 FINDINGS: The lungs are clear. The pulmonary vasculature is normal. Heart size is normal. Hilar and mediastinal  contours are unremarkable. There is no pleural effusion. IMPRESSION: No active cardiopulmonary disease. Electronically Signed   By: Andreas Newport M.D.   On: 06/18/2017 03:41   Ct Head Wo Contrast  Result Date: 06/16/2017 CLINICAL DATA:  Headache EXAM: CT HEAD WITHOUT CONTRAST TECHNIQUE: Contiguous axial images were obtained from the base of the skull through the vertex without intravenous contrast. COMPARISON:  02/04/2009 FINDINGS: Brain: No mass effect, midline shift, or acute intracranial hemorrhage. Brain parenchyma and ventricular system are unremarkable. Vascular: No hyperdense vessel or unexpected calcification. Skull: Intact. Sinuses/Orbits: Trace fluid in the right mastoid air cells. Left mastoid air cells and visualized paranasal sinuses are clear Other: None. IMPRESSION: Trace fluid in the right mastoid air cells. Otherwise, no evidence of acute intracranial pathology. Electronically Signed   By: Marybelle Killings M.D.   On: 06/16/2017 10:22   Ct Abdomen Pelvis W Contrast  Result Date: 06/16/2017 CLINICAL DATA:  46 year old hypertensive female with headaches, chest pain, shortness breath, left buttock pain extending down left leg. Initial encounter. EXAM: CT  ABDOMEN AND PELVIS WITH CONTRAST TECHNIQUE: Multidetector CT imaging of the abdomen and pelvis was performed using the standard protocol following bolus administration of intravenous contrast. CONTRAST:  193mL ISOVUE-300 IOPAMIDOL (ISOVUE-300) INJECTION 61% COMPARISON:  CT lumbosacral spine and head CT performed same date dictated separately. FINDINGS: Lower chest: Bibasilar subsegmental atelectasis. Hepatobiliary: No focal hepatic lesion. No calcified gallstone. No common bile duct dilation. Pancreas: No mass or inflammation. Spleen: No mass or enlargement. Adrenals/Urinary Tract: No renal or ureteral obstructing stone or hydronephrosis. No renal or adrenal mass. Noncontrast filled views of the urinary bladder unremarkable. Stomach/Bowel: Small  hiatal hernia. Portions of bowel under distended without evidence of extraluminal bowel inflammatory process, free fluid or free air. No inflammation surrounds the appendix or terminal ileum. Vascular/Lymphatic: No aortic aneurysm. 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 Tiny calcification left iliac artery. Findings suggestive of result of atherosclerotic changes in this hypertensive patient. No adenopathy. Reproductive: Mild prominence pelvic vessels may be normal for this patient. Uterus tilted slightly to left. No worrisome adnexal abnormality. Other: No bowel containing hernia. Musculoskeletal: Mild degenerative changes lower lumbar spine. IMPRESSION: No bowel inflammatory process noted. No renal or ureteral obstructing stone or evidence of hydronephrosis. 40% focal narrowing of the proximal superior mesenteric artery appears to be secondary to focal noncalcified plaque. Tiny calcification left iliac artery. Findings suggestive of result of atherosclerotic changes in this hypertensive patient. Mild degenerative changes lower lumbar spine. Slightly prominent pelvic vasculature (greater on left) may be normal for this patient. Electronically Signed   By: Genia Del M.D.   On: 06/16/2017 10:47   Ct L-spine No Charge  Result Date: 06/16/2017 CLINICAL DATA:  Left buttock pain radiating down the leg. EXAM: CT LUMBAR SPINE WITHOUT CONTRAST TECHNIQUE: Multidetector CT imaging of the lumbar spine was performed without intravenous contrast administration. Multiplanar CT image reconstructions were also generated. COMPARISON:  None. FINDINGS: Segmentation: 5 lumbar type vertebral bodies. Alignment: Normal Vertebrae: No fracture or primary bone lesion. Paraspinal and other soft tissues: Negative. See results of abdominal CT. Disc levels: No abnormality from T12-L1 through  L3-4. L4-5: Mild bulging of the disc. 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 Mild narrowing of both lateral recesses without definite neural compression. L5-S1:  No disc abnormality seen.  Mild facet osteoarthritis. 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 At L4-5, there is also mild bulging of the disc. There is narrowing of the lateral recesses at L4-5. Definite neural compression is not established, but there would be some potential for nerve irritation. Certainly, the findings could contribute to low back pain. Electronically Signed   By: Nelson Chimes M.D.   On: 06/16/2017 10:21   Dg Abd Acute W/chest  Result Date: 06/16/2017 CLINICAL DATA:  46 year old female with a history of abdominal pain and vomiting EXAM: DG ABDOMEN ACUTE W/ 1V CHEST COMPARISON:  10/20/2016 FINDINGS: Chest: Cardiomediastinal silhouette unchanged in size and contour. Low lung volumes accentuates the interstitium in the vasculature. No pneumothorax or pleural effusion. No confluent airspace disease. Abdomen: Gas within stomach, small bowel, colon. Formed stool within right colon, splenic flexure descending colon, and the rectum. No abnormally distended small bowel or colon. No air-fluid levels on the upright image. No unexpected calcifications. No radiopaque foreign body. Rounded soft tissue density in the anatomic pelvis, likely represents urinary bladder or fibroid uterus. IMPRESSION: Chest: No radiographic evidence of acute cardiopulmonary  disease. Abdomen: Moderate stool burden without evidence of obstruction. Electronically Signed   By: Corrie Mckusick D.O.   On: 06/16/2017 08:34    Procedures Procedures (including critical care time)  Medications Ordered in ED Medications  sodium chloride 0.9 % bolus 1,000 mL (not administered)     Initial Impression / Assessment and Plan / ED Course  I have reviewed the triage vital signs and the nursing notes.  Pertinent labs & imaging  results that were available during my care of the patient were reviewed by me and considered in my medical decision making (see chart for details).     Patient with persistent chest pain including nausea and headache after ED visit 2 days ago. She is in no distress. Abdomen is soft. EKG is normal sinus rhythm.  Her chest pain is reproducible and worse with palpation. Her headache is gradual in onset. CT head obtained 2 days ago was negative.  Labs are reassuring. Troponin and d-dimer are negative. No hypoxia. Chest x-rays negative.  Patient given IV fluids and antiemetics. No vomiting in the ED. She feels improved after above treatments. She is tolerating by mouth and ambulatory. Headache has resolved. Low suspicion for subarachnoid hemorrhage, meningitis, temporal arteritis.  Patient was given antiemetics for home use. Her headache and chest pain have resolved. Low suspicion for ACS or PE. Tolerating PO in the ED.  Stop prednisone and flexeril as that may be contributing to her nausea. Follow up with PCP. Return precautions discussed.-  Final Clinical Impressions(s) / ED Diagnoses   Final diagnoses:  Atypical chest pain  Headache, unspecified headache type    New Prescriptions New Prescriptions   No medications on file     Ezequiel Essex, MD 06/18/17 [phone]

## 2017-09-23 DIAGNOSIS — I10 Essential (primary) hypertension: Secondary | ICD-10-CM | POA: Insufficient documentation

## 2017-10-09 ENCOUNTER — Telehealth: Payer: Self-pay

## 2017-10-09 NOTE — Telephone Encounter (Signed)
Spoke to patient daughter about scheduling Patient Echocardiogram Received referral for Echo from [facility] and notes placed in referrals  She asked if I could call back so she may talk to patient Attempted to call back no answer, LMOV  Will attempt to call again later on

## 2017-10-13 ENCOUNTER — Other Ambulatory Visit: Payer: Self-pay | Admitting: Cardiology

## 2017-10-13 DIAGNOSIS — R079 Chest pain, unspecified: Secondary | ICD-10-CM

## 2017-10-21 ENCOUNTER — Other Ambulatory Visit: Payer: Self-pay

## 2017-10-21 ENCOUNTER — Other Ambulatory Visit: Payer: Self-pay | Admitting: Cardiology

## 2017-10-21 ENCOUNTER — Ambulatory Visit (INDEPENDENT_AMBULATORY_CARE_PROVIDER_SITE_OTHER): Payer: Self-pay

## 2017-10-21 DIAGNOSIS — R079 Chest pain, unspecified: Secondary | ICD-10-CM

## 2017-10-21 DIAGNOSIS — R011 Cardiac murmur, unspecified: Secondary | ICD-10-CM

## 2017-10-23 ENCOUNTER — Ambulatory Visit: Payer: Managed Care, Other (non HMO)

## 2017-10-24 ENCOUNTER — Telehealth: Payer: Self-pay

## 2017-10-24 NOTE — Telephone Encounter (Signed)
Pt scheduled for 11/5 GXT. Left message on pt's VM

## 2017-10-27 ENCOUNTER — Other Ambulatory Visit: Payer: Self-pay | Admitting: *Deleted

## 2017-10-27 ENCOUNTER — Ambulatory Visit (INDEPENDENT_AMBULATORY_CARE_PROVIDER_SITE_OTHER): Payer: Self-pay

## 2017-10-27 ENCOUNTER — Encounter: Payer: Self-pay | Admitting: *Deleted

## 2017-10-27 DIAGNOSIS — R079 Chest pain, unspecified: Secondary | ICD-10-CM

## 2017-10-28 ENCOUNTER — Ambulatory Visit: Payer: Self-pay | Admitting: Cardiovascular Disease

## 2017-10-31 LAB — EXERCISE TOLERANCE TEST
CSEPED: 6 min
CSEPEDS: 26 s
CSEPHR: 90 %
CSEPPHR: 155 {beats}/min
Estimated workload: 7.6 METS
MPHR: 172 {beats}/min
Rest HR: 70 {beats}/min

## 2017-11-24 ENCOUNTER — Ambulatory Visit: Payer: Self-pay

## 2017-12-20 ENCOUNTER — Encounter (HOSPITAL_COMMUNITY): Payer: Self-pay | Admitting: Emergency Medicine

## 2017-12-20 ENCOUNTER — Emergency Department (HOSPITAL_COMMUNITY): Payer: Self-pay

## 2017-12-20 ENCOUNTER — Emergency Department (HOSPITAL_COMMUNITY)
Admission: EM | Admit: 2017-12-20 | Discharge: 2017-12-20 | Disposition: A | Discharge status: Home / Self Care | Payer: Self-pay | Attending: Emergency Medicine | Admitting: Emergency Medicine

## 2017-12-20 ENCOUNTER — Other Ambulatory Visit: Payer: Self-pay

## 2017-12-20 DIAGNOSIS — J209 Acute bronchitis, unspecified: Secondary | ICD-10-CM | POA: Insufficient documentation

## 2017-12-20 DIAGNOSIS — Z79899 Other long term (current) drug therapy: Secondary | ICD-10-CM | POA: Insufficient documentation

## 2017-12-20 DIAGNOSIS — I10 Essential (primary) hypertension: Secondary | ICD-10-CM | POA: Insufficient documentation

## 2017-12-20 LAB — RAPID STREP SCREEN (MED CTR MEBANE ONLY): Streptococcus, Group A Screen (Direct): NEGATIVE

## 2017-12-20 MED ORDER — IPRATROPIUM-ALBUTEROL 0.5-2.5 (3) MG/3ML IN SOLN
3.0000 mL | Freq: Once | RESPIRATORY_TRACT | Status: AC
  Administered 2017-12-20: 3 mL via RESPIRATORY_TRACT
  Filled 2017-12-20: qty 3

## 2017-12-20 MED ORDER — ALBUTEROL SULFATE (2.5 MG/3ML) 0.083% IN NEBU
2.5000 mg | INHALATION_SOLUTION | Freq: Once | RESPIRATORY_TRACT | Status: AC
  Administered 2017-12-20: 2.5 mg via RESPIRATORY_TRACT
  Filled 2017-12-20: qty 3

## 2017-12-20 MED ORDER — BENZONATATE 100 MG PO CAPS: 200.0000 mg | ORAL_CAPSULE | Freq: Three times a day (TID) | ORAL | 0 refills | Status: DC | PRN

## 2017-12-20 MED ORDER — ALBUTEROL SULFATE HFA 108 (90 BASE) MCG/ACT IN AERS
2.0000 | INHALATION_SPRAY | RESPIRATORY_TRACT | Status: DC | PRN
  Administered 2017-12-20: 2 via RESPIRATORY_TRACT
  Filled 2017-12-20: qty 6.7

## 2017-12-20 NOTE — ED Provider Notes (Signed)
[facility] EMERGENCY DEPARTMENT Provider Note   CSN: [id number] Arrival date & time: 12/20/17  1520     History   Chief Complaint Chief Complaint  Patient presents with  . Sore Throat  . Cough    HPI Sylvia Nguyen is a 46 y.o. female with a history of hypertension, presenting with URI type symptoms including sore throat, nasal congestion with clear rhinorrhea, cough which has been nonproductive.  In association she reports tightness in her chest along with wheezing with deep inspiration.  She presents with her son who has similar symptoms.  She denies fevers or chills, headache, neck pain or stiffness, chest pain, nausea, vomiting or abdominal pain.  She has had no medications for symptom relief.  She does not smoke.  The history is provided by the patient. The history is limited by a language barrier. A language interpreter was used.    Past Medical History:  Diagnosis Date  . Hypertension     There are no active problems to display for this patient.   History reviewed. No pertinent surgical history.  OB History    No data available       Home Medications    Prior to Admission medications   Medication Sig Start Date End Date Taking? Authorizing Provider  benzonatate (TESSALON) 100 MG capsule Take 2 capsules (200 mg total) by mouth 3 (three) times daily as needed for cough. 12/20/17   Evalee Jefferson, PA-C  cyclobenzaprine (FLEXERIL) 5 MG tablet Take 1 tablet (5 mg total) by mouth 3 (three) times daily as needed for muscle spasms. 06/16/17   Drenda Freeze, MD  lisinopril-hydrochlorothiazide (PRINZIDE,ZESTORETIC) 10-12.5 MG tablet Take 1 tablet by mouth daily.    [provider]  ondansetron (ZOFRAN ODT) 4 MG disintegrating tablet Take 1 tablet (4 mg total) by mouth every 8 (eight) hours as needed for nausea or vomiting. 06/18/17   Rancour, Annie Main, MD  predniSONE (DELTASONE) 20 MG tablet Take 2 tablets (40 mg total) by mouth daily. 06/16/17   Drenda Freeze, MD  promethazine (PHENERGAN) 25 MG tablet Take 1 tablet (25 mg total) by mouth every 6 (six) hours as needed for nausea or vomiting. 06/16/17   Drenda Freeze, MD    Family History No family history on file.  Social History Social History   Tobacco Use  . Smoking status: Never Smoker  . Smokeless tobacco: Never Used  Substance Use Topics  . Alcohol use: No  . Drug use: No     Allergies   Patient has no known allergies.   Review of Systems Review of Systems  Constitutional: Negative for chills and fever.  HENT: Positive for congestion, rhinorrhea and sore throat. Negative for ear pain, sinus pressure, trouble swallowing and voice change.   Eyes: Negative for discharge.  Respiratory: Positive for cough, chest tightness and wheezing. Negative for shortness of breath and stridor.   Cardiovascular: Negative for chest pain.  Gastrointestinal: Negative for abdominal pain.  Genitourinary: Negative.      Physical Exam Updated Vital Signs BP (!) 142/91 (BP Location: Left Arm)   Pulse 74   Temp 98.8 F (37.1 C) (Oral)   Resp 18   SpO2 96%   Physical Exam  Constitutional: She is oriented to person, place, and time. She appears well-developed and well-nourished.  HENT:  Head: Normocephalic and atraumatic.  Right Ear: Tympanic membrane and ear canal normal.  Left Ear: Tympanic membrane and ear canal normal.  Nose: No mucosal edema or  rhinorrhea.  Mouth/Throat: Uvula is midline, oropharynx is clear and moist and mucous membranes are normal. No oropharyngeal exudate, posterior oropharyngeal edema, posterior oropharyngeal erythema or tonsillar abscesses.  Eyes: Conjunctivae are normal.  Cardiovascular: Normal rate and normal heart sounds.  Pulmonary/Chest: Effort normal. No respiratory distress. She has wheezes in the right middle field and the right lower field. She has no rales.  Abdominal: Soft. She exhibits no distension. There is no tenderness.    Musculoskeletal: Normal range of motion.  Neurological: She is alert and oriented to person, place, and time.  Skin: Skin is warm and dry. No rash noted.  Psychiatric: She has a normal mood and affect.     ED Treatments / Results  Labs (all labs ordered are listed, but only abnormal results are displayed) Labs Reviewed  RAPID STREP SCREEN (NOT AT [facility])  CULTURE, GROUP A STREP [facility])    EKG  EKG Interpretation None       Radiology Dg Chest 2 View  Result Date: 12/20/2017 CLINICAL DATA:  One week history of cough. EXAM: CHEST  2 VIEW COMPARISON:  06/18/2017 FINDINGS: The cardiac silhouette, mediastinal and hilar contours are within normal limits and stable. The lungs are clear. No pleural effusion. The bony thorax is intact. IMPRESSION: No acute cardiopulmonary findings. Electronically Signed   By: Marijo Sanes M.D.   On: 12/20/2017 19:16    Procedures Procedures (including critical care time)  Medications Ordered in ED Medications  albuterol (PROVENTIL HFA;VENTOLIN HFA) 108 (90 Base) MCG/ACT inhaler 2 puff (not administered)  ipratropium-albuterol (DUONEB) 0.5-2.5 (3) MG/3ML nebulizer solution 3 mL (3 mLs Nebulization Given 12/20/17 1920)  albuterol (PROVENTIL) (2.5 MG/3ML) 0.083% nebulizer solution 2.5 mg (2.5 mg Nebulization Given 12/20/17 1920)     Initial Impression / Assessment and Plan / ED Course  I have reviewed the triage vital signs and the nursing notes.  Pertinent labs & imaging results that were available during my care of the patient were reviewed by me and considered in my medical decision making (see chart for details).     Patient was given an albuterol and Atrovent nebulizer treatment with complete resolution of wheezing and chest tightness.  She had no further cough also after the breathing treatment.  Chest x-ray was clear and this was discussed with patient.  She was given albuterol MDI instructed in its use along with Tessalon pearls for cough  relief.  Advised rest, plan follow-up with PCP or return here for any persistent or worsening symptoms.  Final Clinical Impressions(s) / ED Diagnoses   Final diagnoses:  Acute bronchitis, unspecified organism    ED Discharge Orders        Ordered    benzonatate (TESSALON) 100 MG capsule  3 times daily PRN     12/20/17 2000       Evalee Jefferson, PA-C 12/20/17 Jonelle Sports, MD 12/20/17 2359

## 2017-12-20 NOTE — ED Triage Notes (Signed)
Patient complains of sore throat and cough x 1 week.

## 2017-12-20 NOTE — Discharge Instructions (Signed)
Use 2 puffs of the inhaler you were given every 4 hours if you are coughing or wheezing (feeling tight in your breathing). You may also take tessalon to help with your coughing symptom.  Your chest xray is clear, so there is no need for an antibiotic prescription today.

## 2017-12-22 ENCOUNTER — Telehealth: Payer: Self-pay

## 2017-12-22 NOTE — Telephone Encounter (Signed)
[facility] REQUESTED FOR 12/24/17 OV WITH DR. END

## 2017-12-23 LAB — CULTURE, GROUP A STREP (THRC)

## 2017-12-23 NOTE — Progress Notes (Deleted)
New Outpatient Visit Date: 12/24/2017  Referring Provider: Auburn Nguyen, [place], [place] [address]  Chief Complaint: ***  HPI:  Sylvia Nguyen is a 47 y.o. female who is being seen today for the evaluation of *** at the request of Sylvia Bilberry, MD. She has a history of HTN, hyperlipidemia, and GERD. She present for evaluation of ***. She was previously seen by Sylvia Nguyen [facility]) in 09/2017 and was referred for TTE, ETT, and 24-hour Holter monitor.  --------------------------------------------------------------------------------------------------  Cardiovascular History & Procedures: Cardiovascular Problems:  Chest pain  Dyspnea on exertion  Palpitations  Risk Factors:  Hypertension  Hyperlipidemia  Cath/PCI:  None  CV Surgery:  None  EP Procedures and Devices:  24-hour Holter (***): ***  Non-Invasive Evaluation(s):  ETT (10/27/17): Fair exercise capacity (6 min, 26 sec; 7.6 METS). 90% MPHR without ST/T changes - Low risk.  TTE (10/21/17): Normal LV size. LVEF 60-65% with normal wall motion and diastolic function. Normal RV size and function. Trivial MR. Normal PA pressure.  Recent CV Pertinent Labs: Lab Results  Component Value Date   K 3.5 06/18/2017   BUN 12 06/18/2017   CREATININE 0.70 06/18/2017    --------------------------------------------------------------------------------------------------  Past Medical History:  Diagnosis Date  . Hypertension     No past surgical history on file.  No outpatient medications have been marked as taking for the 12/24/17 encounter (Appointment) with Sylvia Nguyen, Sylvia Gave, MD.    Allergies: Patient has no known allergies.  Social History   Socioeconomic History  . Marital status: Married    Spouse name: Not on file  . Number of children: Not on file  . Years of education: Not on file  . Highest education level: Not on file  Social Needs  . Financial resource strain:  Not on file  . Food insecurity - worry: Not on file  . Food insecurity - inability: Not on file  . Transportation needs - medical: Not on file  . Transportation needs - non-medical: Not on file  Occupational History  . Not on file  Tobacco Use  . Smoking status: Never Smoker  . Smokeless tobacco: Never Used  Substance and Sexual Activity  . Alcohol use: No  . Drug use: No  . Sexual activity: Not on file  Other Topics Concern  . Not on file  Social History Narrative  . Not on file    No family history on file.  Review of Systems: A 12-system review of systems was performed and was negative except as noted in the HPI.  --------------------------------------------------------------------------------------------------  Physical Exam: There were no vitals taken for this visit.  General:  *** HEENT: No conjunctival pallor or scleral icterus. Moist mucous membranes. OP clear. Neck: Supple without lymphadenopathy, thyromegaly, JVD, or HJR. No carotid bruit. Lungs: Normal work of breathing. Clear to auscultation bilaterally without wheezes or crackles. Heart: Regular rate and rhythm without murmurs, rubs, or gallops. Non-displaced PMI. Abd: Bowel sounds present. Soft, NT/ND without hepatosplenomegaly Ext: No lower extremity edema. Radial, PT, and DP pulses are 2+ bilaterally Skin: Warm and dry without rash. Neuro: CNIII-XII intact. Strength and fine-touch sensation intact in upper and lower extremities bilaterally. Psych: Normal mood and affect.  EKG:  ***  Lab Results  Component Value Date   WBC 7.6 06/18/2017   HGB 12.5 06/18/2017   HCT 35.9 (L) 06/18/2017   MCV 87.3 06/18/2017   PLT 224 06/18/2017    Lab Results  Component Value Date   NA  136 06/18/2017   K 3.5 06/18/2017   CL 101 06/18/2017   CO2 26 06/18/2017   BUN 12 06/18/2017   CREATININE 0.70 06/18/2017   GLUCOSE 97 06/18/2017   ALT 17 06/18/2017    No results found for: CHOL, HDL, LDLCALC, LDLDIRECT,  TRIG, CHOLHDL   --------------------------------------------------------------------------------------------------  ASSESSMENT AND PLAN: ***  Nelva Bush, MD 12/23/2017 8:35 PM

## 2017-12-24 ENCOUNTER — Ambulatory Visit: Payer: Self-pay | Admitting: Internal Medicine

## 2018-01-29 NOTE — Progress Notes (Deleted)
Cardiology Office Note  Date:  01/29/2018   ID:  Sylvia Nguyen, Sylvia Nguyen 01-06-1971, MRN [id number]  PCP:  Auburn Bilberry, MD   No chief complaint on file.   HPI:  Sylvia Nguyen is a 47 year old woman with past medical history of Hypertension Heart palpitations Anxiety Atypical chest pain Previous seen by outside cardiology October 2018 for chest pain palpitations hypertension  Treadmill, echo Holter was ordered She completed a Holter, other studies were not covered by insurance  Echocardiogram October 21, 2017 Normal study ejection fraction 60%,  normal pressures, normal valves  Described having tightness, pressure with or without exertion, worse with stress Chronic exertional shortness of breath  Treadmill stress test : Normal study, peak heart rate 155 bpm, 7.6 ME$TS  24-hour Holter monitor September 29, 2017 was performed which revealed  normal sinus rhythm with a mean heart rate of 61 bpm.  Sinus bradycardia was observed.  Rare premature atrial contractions were present. There were no diary entries.     PMH:   has a past medical history of Hypertension.  PSH:   No past surgical history on file.  Current Outpatient Medications  Medication Sig Dispense Refill  . benzonatate (TESSALON) 100 MG capsule Take 2 capsules (200 mg total) by mouth 3 (three) times daily as needed for cough. 30 capsule 0  . cyclobenzaprine (FLEXERIL) 5 MG tablet Take 1 tablet (5 mg total) by mouth 3 (three) times daily as needed for muscle spasms. 10 tablet 0  . lisinopril-hydrochlorothiazide (PRINZIDE,ZESTORETIC) 10-12.5 MG tablet Take 1 tablet by mouth daily.    . ondansetron (ZOFRAN ODT) 4 MG disintegrating tablet Take 1 tablet (4 mg total) by mouth every 8 (eight) hours as needed for nausea or vomiting. 20 tablet 0  . predniSONE (DELTASONE) 20 MG tablet Take 2 tablets (40 mg total) by mouth daily. 10 tablet 0  . promethazine (PHENERGAN) 25 MG tablet Take 1 tablet (25 mg total) by mouth every 6  (six) hours as needed for nausea or vomiting. 10 tablet 0   No current facility-administered medications for this visit.      Allergies:   Patient has no known allergies.   Social History:  The patient  reports that  has never smoked. she has never used smokeless tobacco. She reports that she does not drink alcohol or use drugs.   Family History:   family history is not on file.    Review of Systems: ROS   PHYSICAL EXAM: VS:  There were no vitals taken for this visit. , BMI There is no height or weight on file to calculate BMI. GEN: Well nourished, well developed, in no acute distress HEENT: normal Neck: no JVD, carotid bruits, or masses Cardiac: RRR; no murmurs, rubs, or gallops,no edema  Respiratory:  clear to auscultation bilaterally, normal work of breathing GI: soft, nontender, nondistended, + BS MS: no deformity or atrophy Skin: warm and dry, no rash Neuro:  Strength and sensation are intact Psych: euthymic mood, full affect    Recent Labs: 06/18/2017: ALT 17; BUN 12; Creatinine, Ser 0.70; Hemoglobin 12.5; Platelets 224; Potassium 3.5; Sodium 136    Lipid Panel No results found for: CHOL, HDL, LDLCALC, TRIG    Wt Readings from Last 3 Encounters:  06/18/17 135 lb (61.2 kg)  06/16/17 135 lb (61.2 kg)       ASSESSMENT AND PLAN:  No diagnosis found.   Disposition:   F/U  6 months  No orders of the defined types were placed in  this encounter.    Signed, Esmond Plants, M.D., Ph.D. 01/29/2018  [place], [place]

## 2018-02-02 ENCOUNTER — Ambulatory Visit: Payer: Self-pay | Admitting: Cardiovascular Disease

## 2018-02-03 ENCOUNTER — Encounter: Payer: Self-pay | Admitting: Cardiovascular Disease

## 2018-02-03 ENCOUNTER — Telehealth: Payer: Self-pay | Admitting: Cardiovascular Disease

## 2018-02-03 NOTE — Telephone Encounter (Signed)
error 

## 2018-02-09 ENCOUNTER — Ambulatory Visit: Payer: Self-pay | Attending: Oncology

## 2018-02-09 ENCOUNTER — Ambulatory Visit
Admission: RE | Admit: 2018-02-09 | Discharge: 2018-02-09 | Disposition: A | Discharge status: Home / Self Care | Payer: Self-pay | Source: Ambulatory Visit | Attending: Oncology | Admitting: Oncology

## 2018-02-09 ENCOUNTER — Encounter (INDEPENDENT_AMBULATORY_CARE_PROVIDER_SITE_OTHER): Payer: Self-pay

## 2018-02-09 VITALS — BP 148/96 | HR 64 | Temp 98.3°F | Ht 66.0 in | Wt 141.0 lb

## 2018-02-09 DIAGNOSIS — Z0000 Encounter for general adult medical examination without abnormal findings: Secondary | ICD-10-CM

## 2018-02-09 NOTE — Progress Notes (Signed)
Subjective:     Patient ID: Sylvia Nguyen, female   DOB: [birth year], 47 y.o.   MRN: [id number]  HPI   Review of Systems     Objective:   Physical Exam  Pulmonary/Chest: Right breast exhibits no inverted nipple, no mass, no nipple discharge, no skin change and no tenderness. Left breast exhibits no inverted nipple, no mass, no nipple discharge, no skin change and no tenderness. Breasts are symmetrical.       Assessment:     47 year old hispanic patient presents for BCCCP screening.  Last seen in [place] in 2014, with mammogram showing a left breast cyst at 12:00.  Loyda Murr interpreted today.   Patient screened, and meets BCCCP eligibility.  Patient does not have insurance, Medicare or Medicaid.  Handout given on Affordable Care Act.  Instructed patient on breast self awareness using teach back method.  CBE unremarkable.  No mass or lump palpated.  Patient reports she has intermittent left breast pain.  She is not having pain today.    Plan:     Sent for bilateral screening mammogram.

## 2018-03-18 NOTE — Progress Notes (Signed)
Letter mailed from [facility] to notify of normal mammogram results.  Patient to return in one year for annual screening.  Copy to [facility]. 

## 2018-06-14 IMAGING — CT CT L SPINE W/O CM
3 series · 11 of 33 positions shown, 13 images · IV contrast (Omni 300)
Comparison: None.

CLINICAL DATA: Left buttock pain radiating down the leg.

EXAM:
CT LUMBAR SPINE WITHOUT CONTRAST
TECHNIQUE: Multidetector CT imaging of the lumbar spine was performed without
intravenous contrast administration. Multiplanar CT image
reconstructions were also generated.

[Series 9: lspine st · axial · 0.29mm/px · z∈[-634,-490]mm · 3 of 117 slices shown, 4 images]
[im 27/117  soft-tissue]
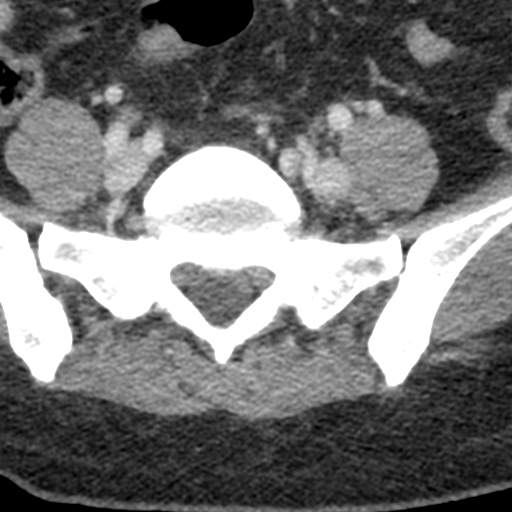
[im 27/117  bone]
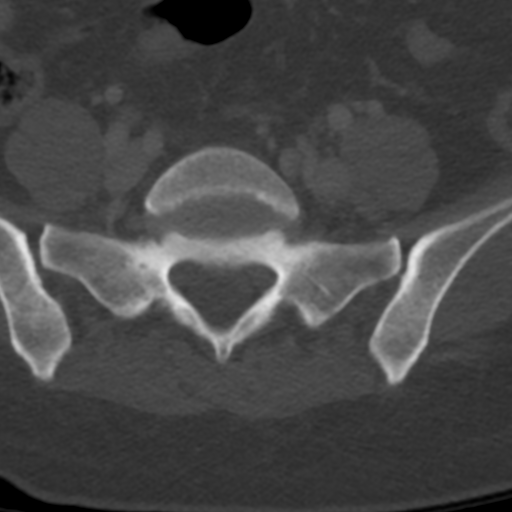
[im 63/117  bone]
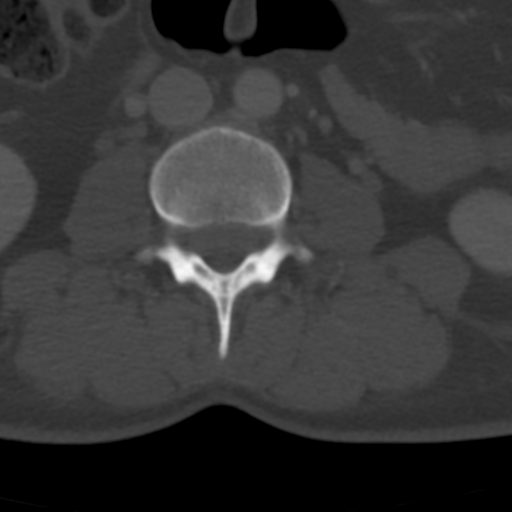
[im 99/117  bone]
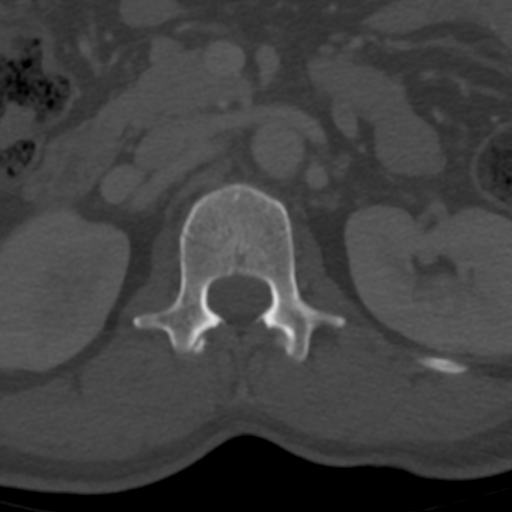

[Series 12: lspine cor · coronal · 0.29mm/px · 3 of 62 slices shown (1 of 2)]
[im 13/62  bone]
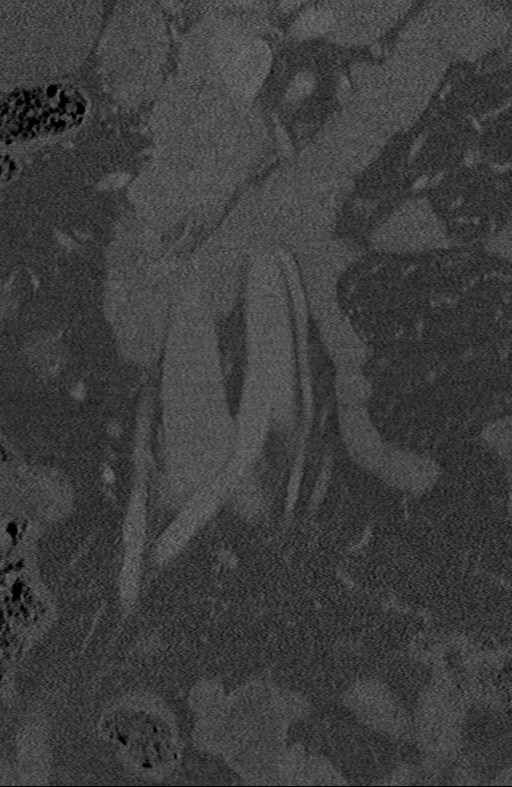
[im 25/62  bone]
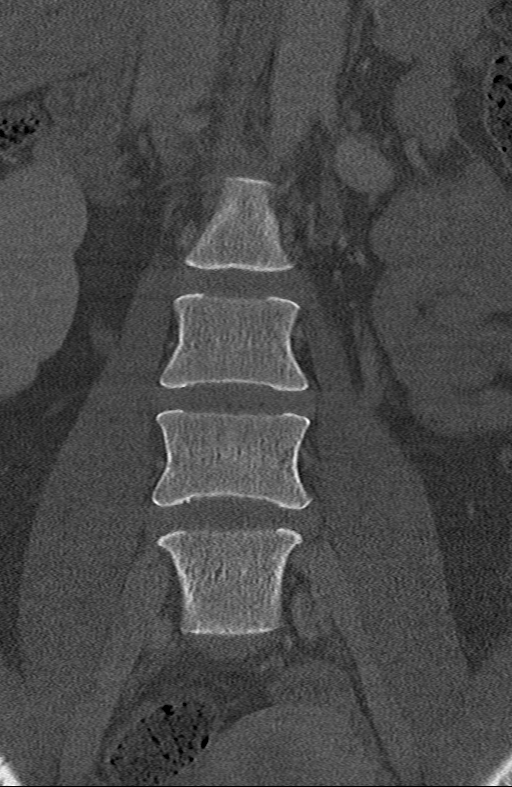
[im 37/62  bone]
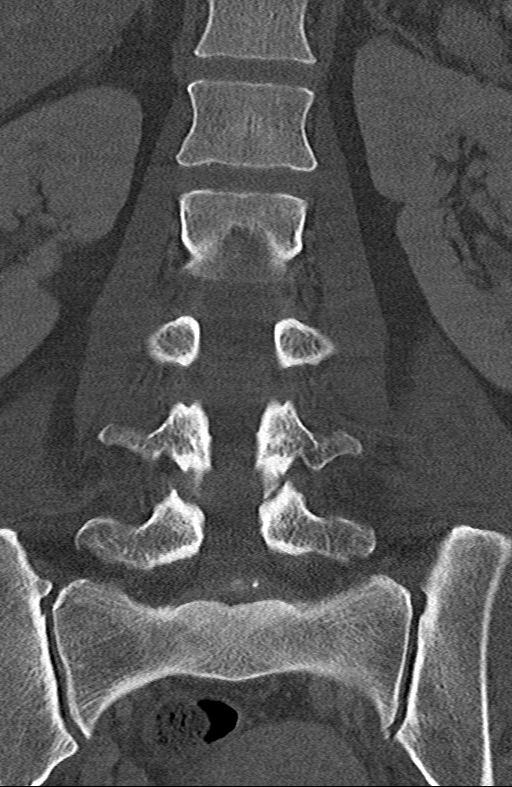

[Series 13: lspine cor · sagittal · 0.24mm/px · 5 of 76 slices shown, 6 images (2 of 2)]
[im 26/76  bone]
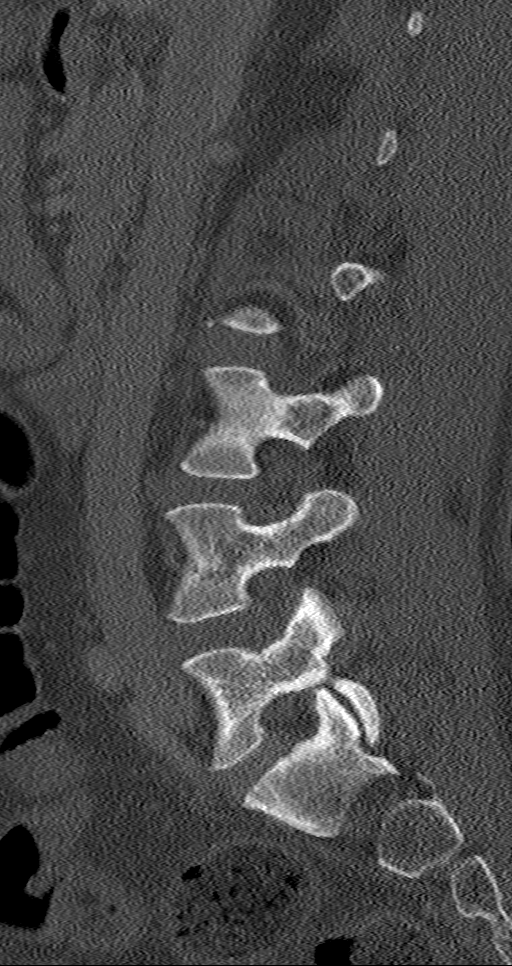
[im 32/76  bone]
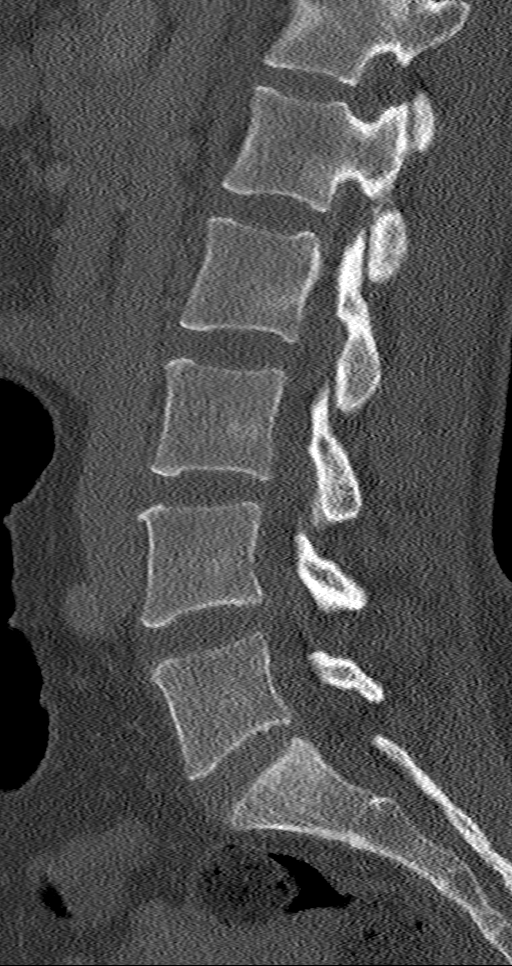
[im 38/76  soft-tissue]
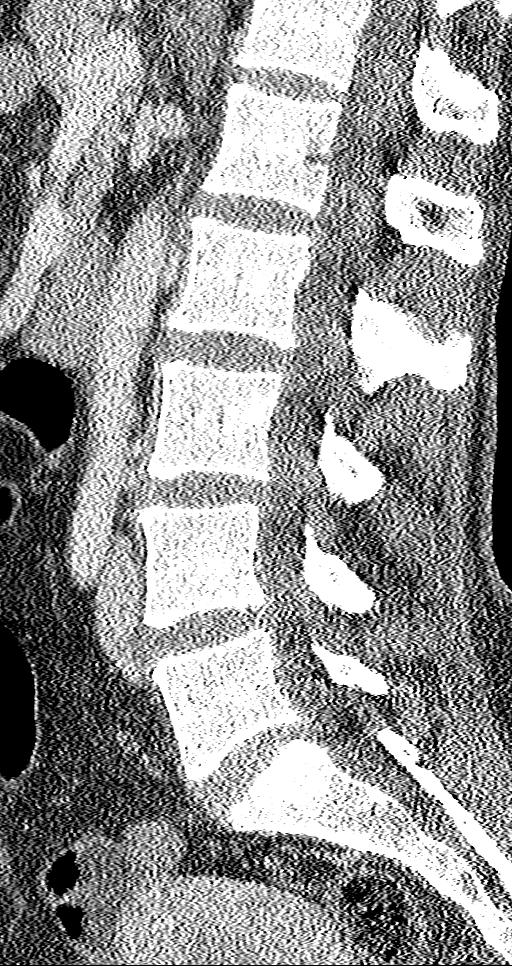
[im 38/76  bone]
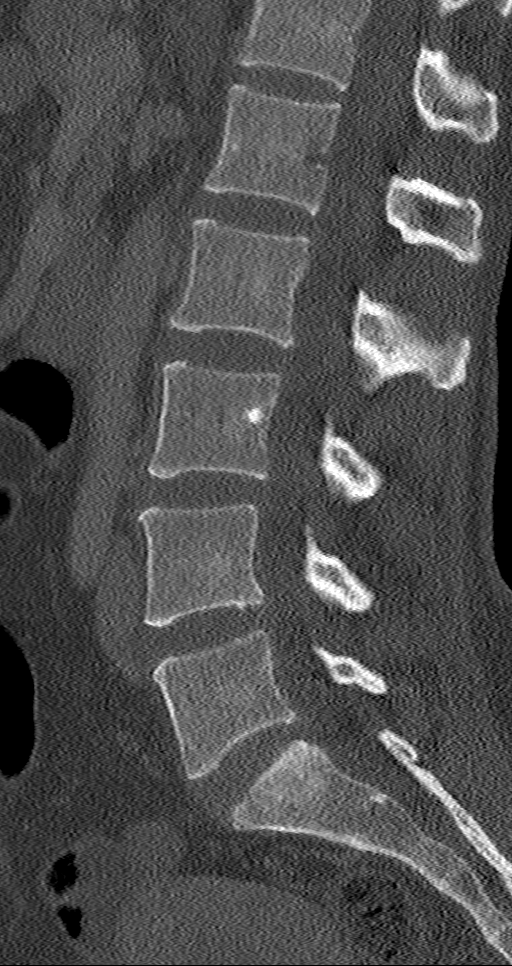
[im 44/76  bone]
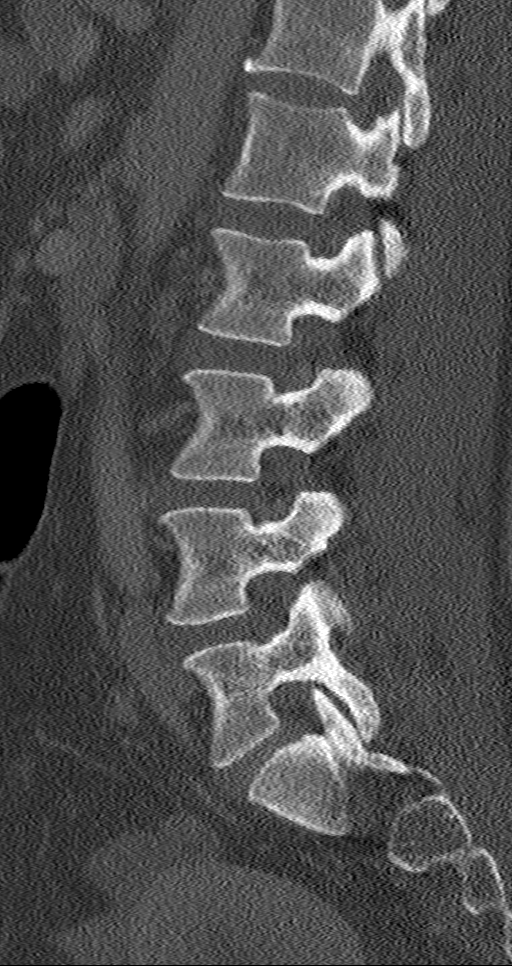
[im 51/76  bone]
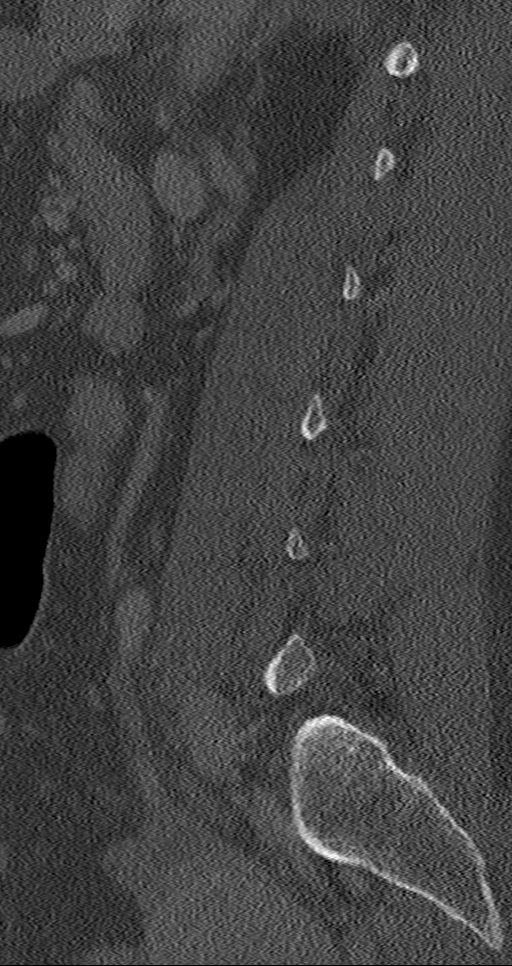

[11 of 33 positions shown; findings below may reference images not displayed]

FINDINGS: Segmentation: 5 lumbar type vertebral bodies.

Alignment: Normal

Vertebrae: No fracture or primary bone lesion.

Paraspinal and other soft tissues: Negative. See results of
abdominal CT.

Disc levels: No abnormality from T12-L1 through L3-4.

L4-5: Mild bulging of the disc. Facet osteoarthritis with mild facet
and ligamentous hypertrophy. Mild narrowing of both lateral recesses
without definite neural compression.

L5-S1:  No disc abnormality seen.  Mild facet osteoarthritis.
IMPRESSION: Facet osteoarthritis, worse at L4-5 than at L5-S1. At L4-5, there is
also mild bulging of the disc. There is narrowing of the lateral
recesses at L4-5. Definite neural compression is not established,
but there would be some potential for nerve irritation. Certainly,
the findings could contribute to low back pain.

## 2018-06-14 IMAGING — DX DG HIP (WITH OR WITHOUT PELVIS) 2-3V*L*
3 series · 3 of 3 positions shown · non-contrast
Comparison: None.

CLINICAL DATA: Lower back and left-sided hip pain tonight. No
trauma.

EXAM:
DG HIP (WITH OR WITHOUT PELVIS) 2-3V LEFT

[pelvis ap]
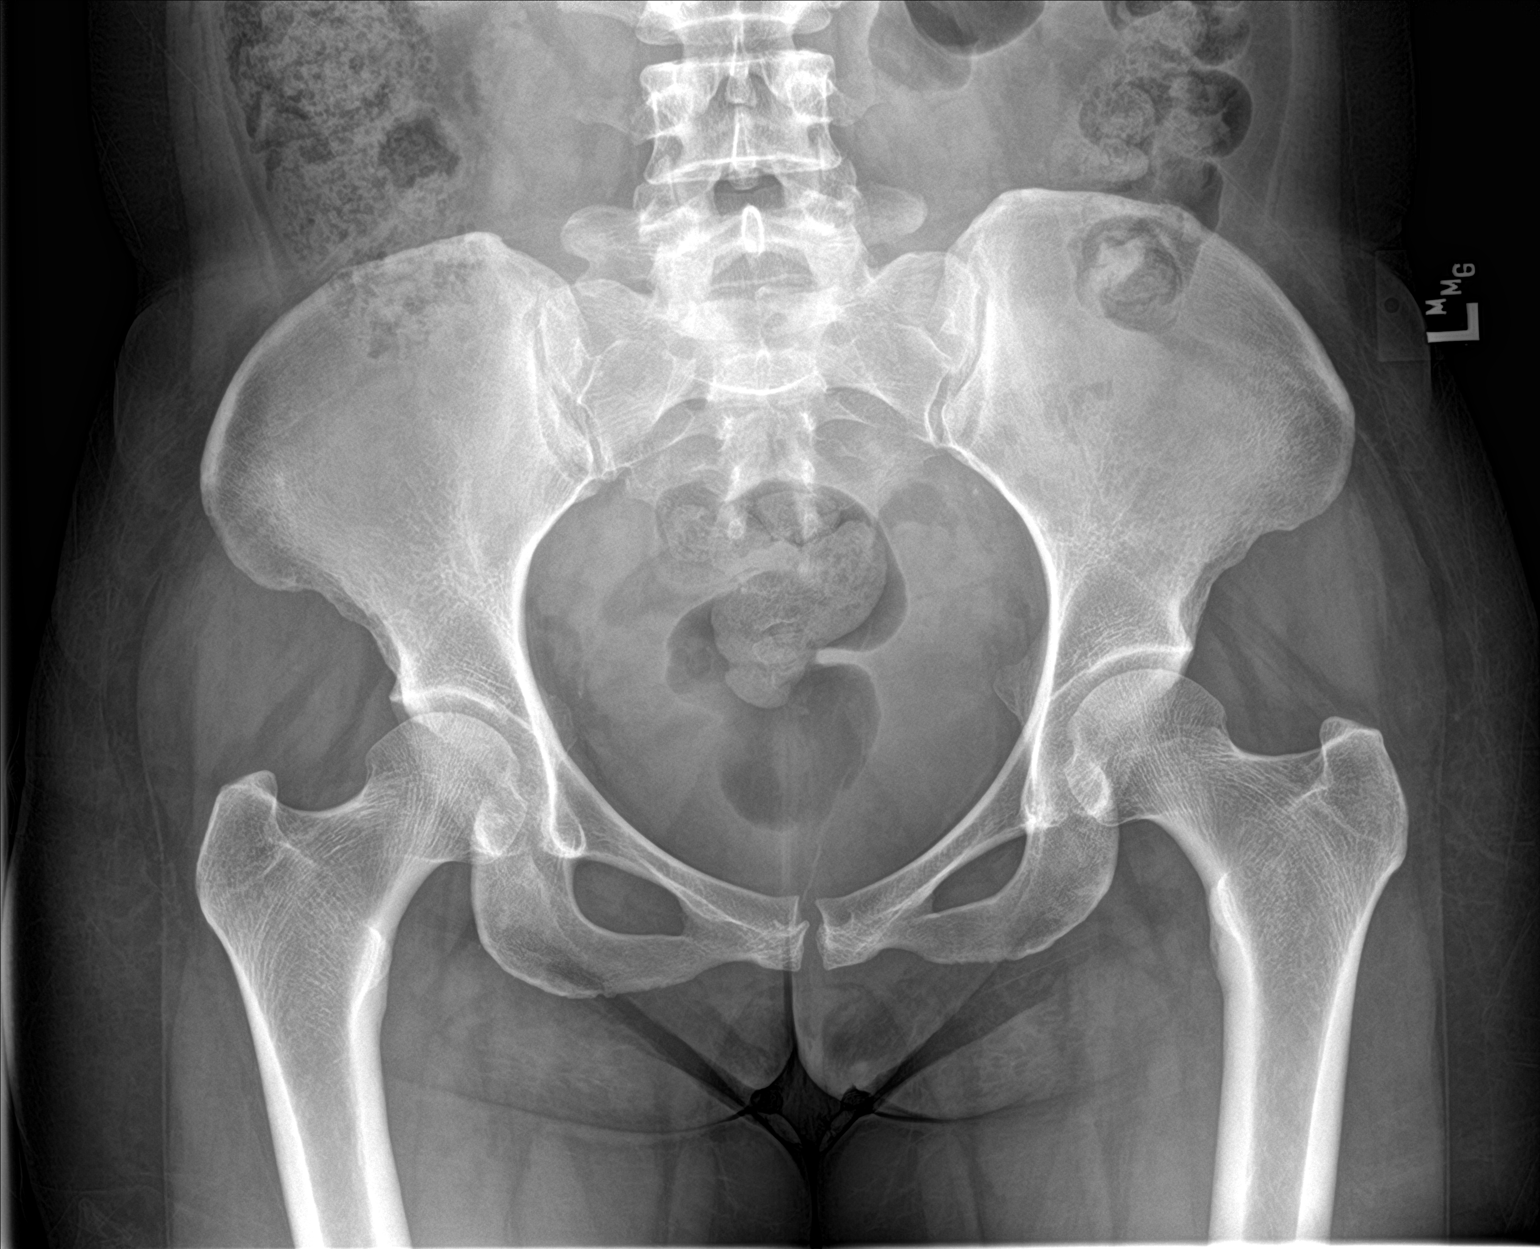

[hip ap]
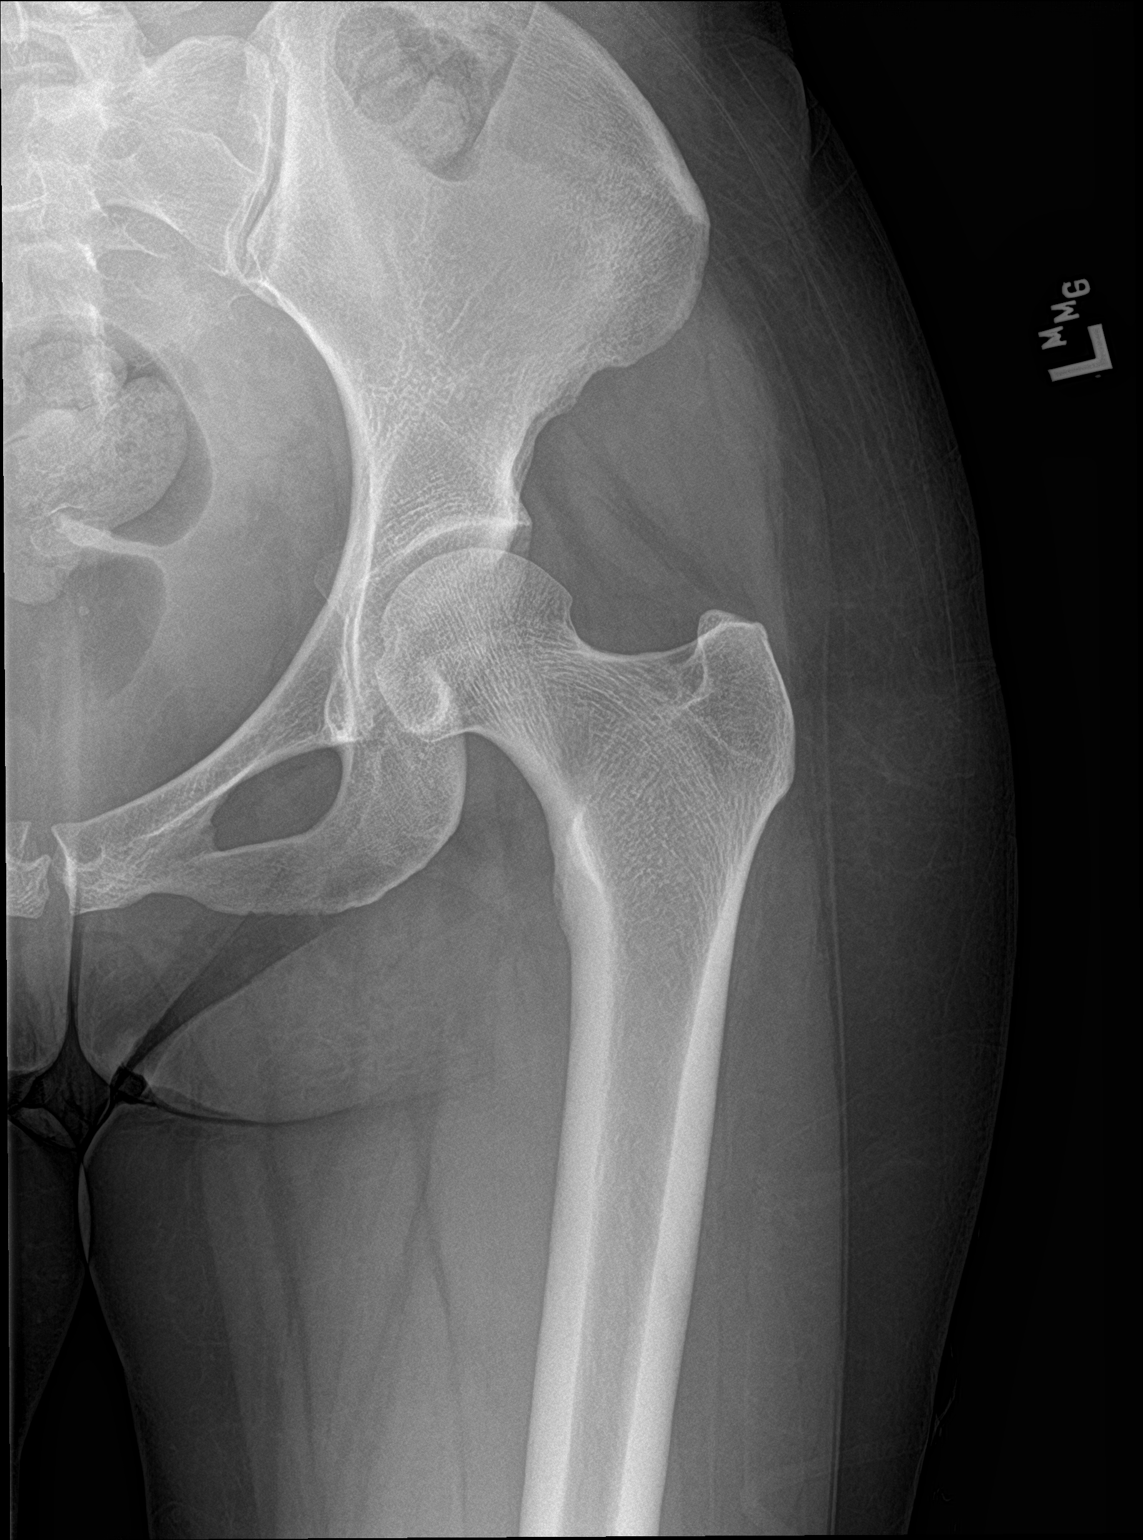

[hip lat]
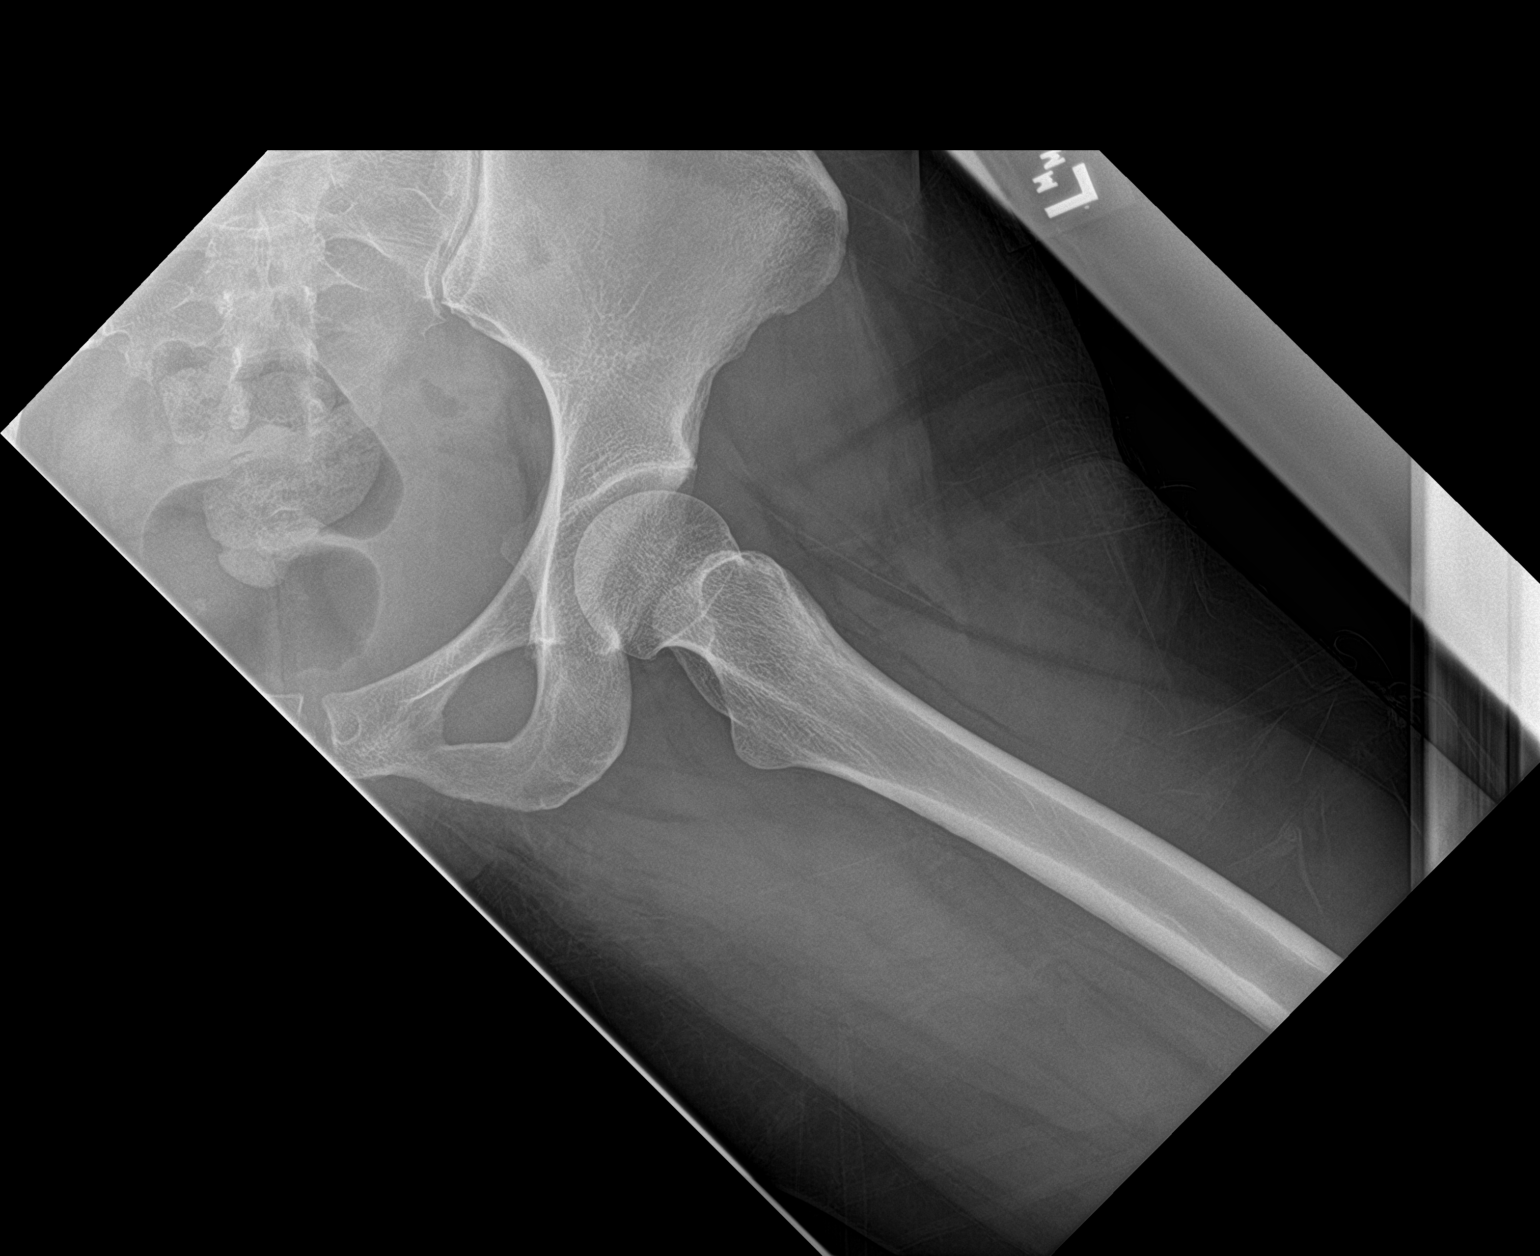

[3 of 3 positions shown; findings below may reference images not displayed]

FINDINGS: There is no evidence of hip fracture or dislocation. There is no
evidence of arthropathy or other focal bone abnormality.
IMPRESSION: Negative.

## 2018-06-14 IMAGING — CR DG ABDOMEN ACUTE W/ 1V CHEST
3 series · 3 of 3 positions shown · non-contrast
Comparison: 10/20/2016

CLINICAL DATA: 45-year-old female with a history of abdominal pain
and vomiting

EXAM:
DG ABDOMEN ACUTE W/ 1V CHEST

[chest pa]
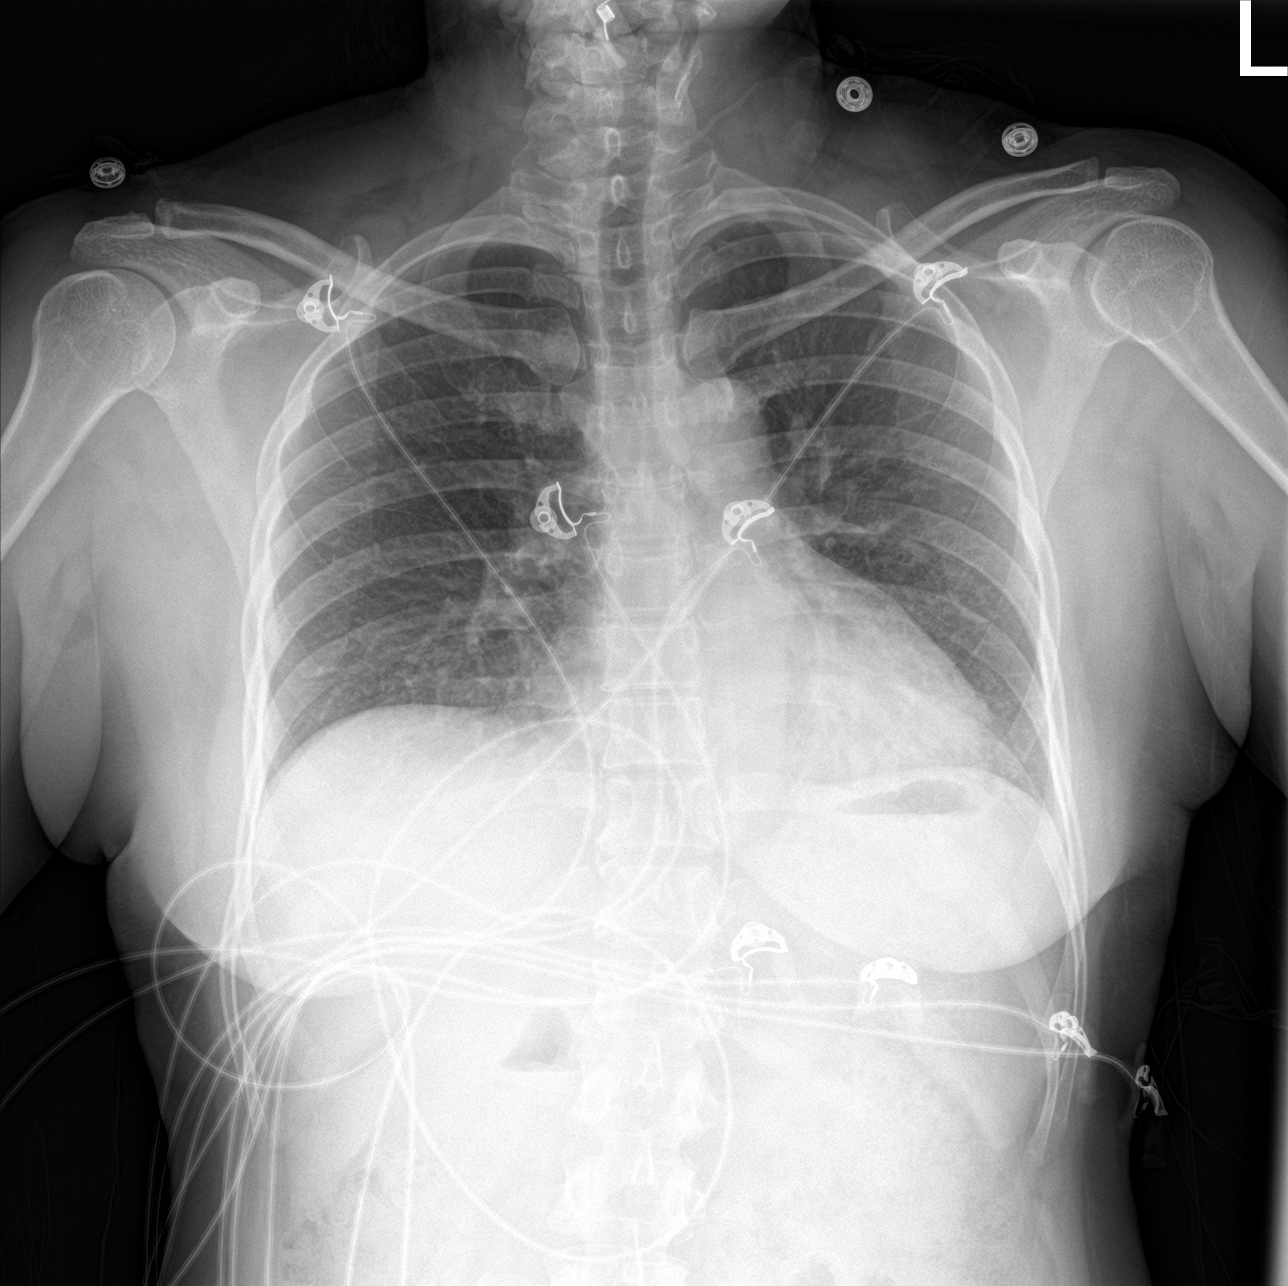

[abdomen erect]
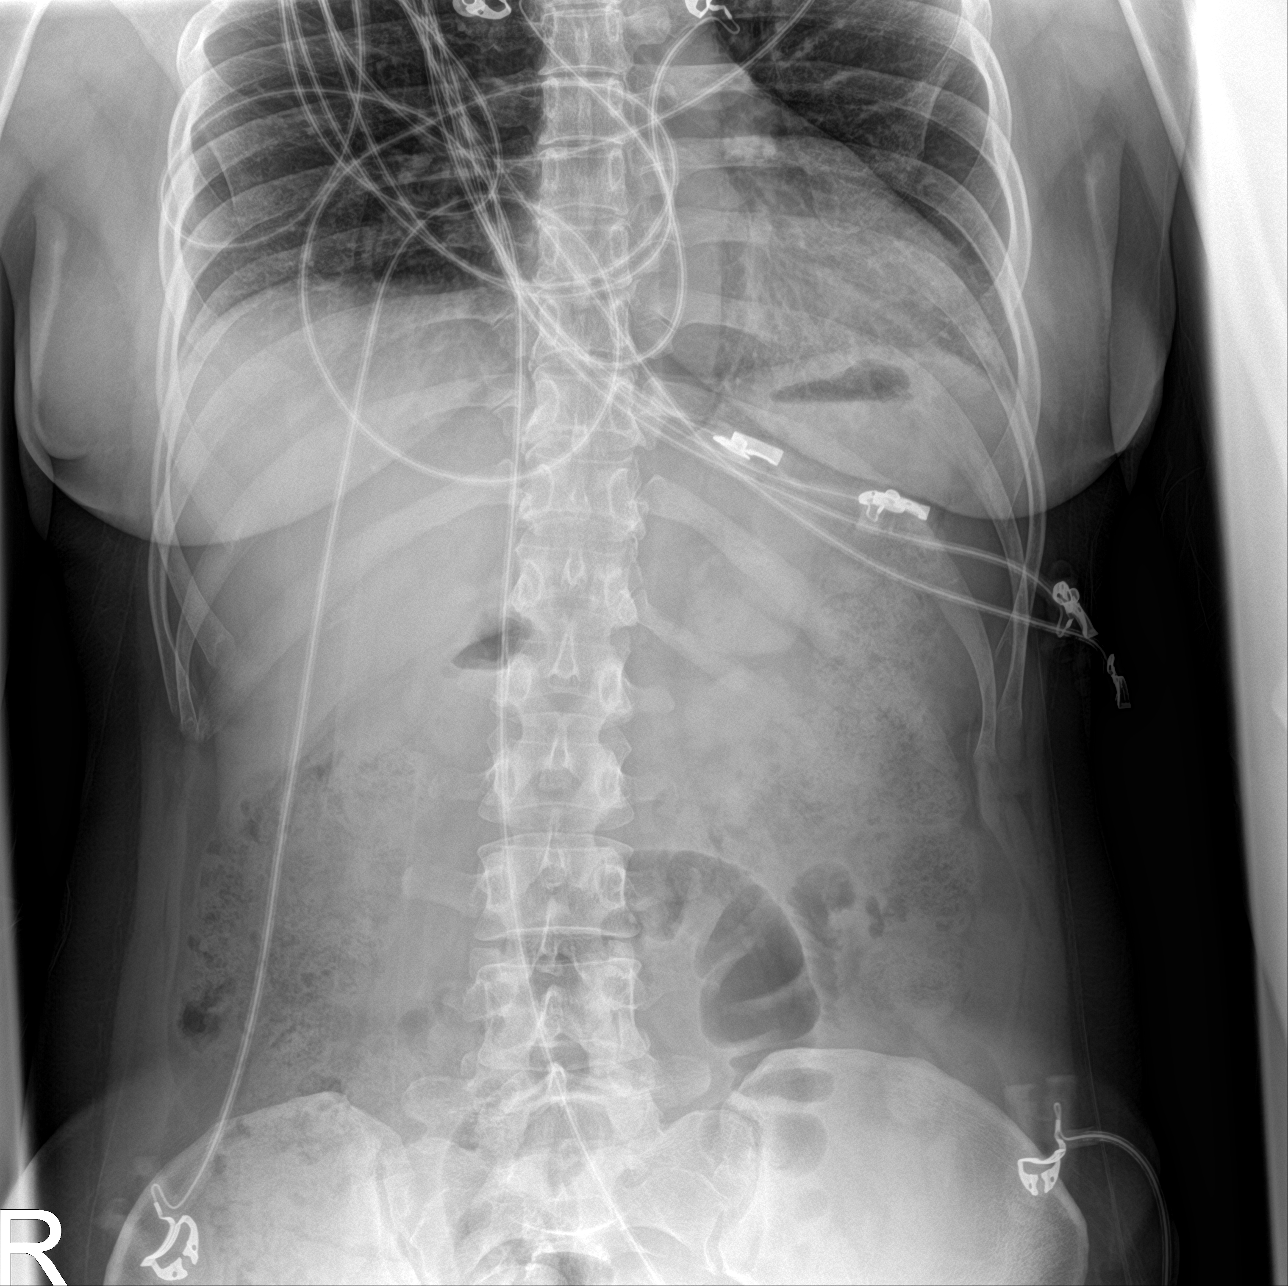

[abdomen supine]
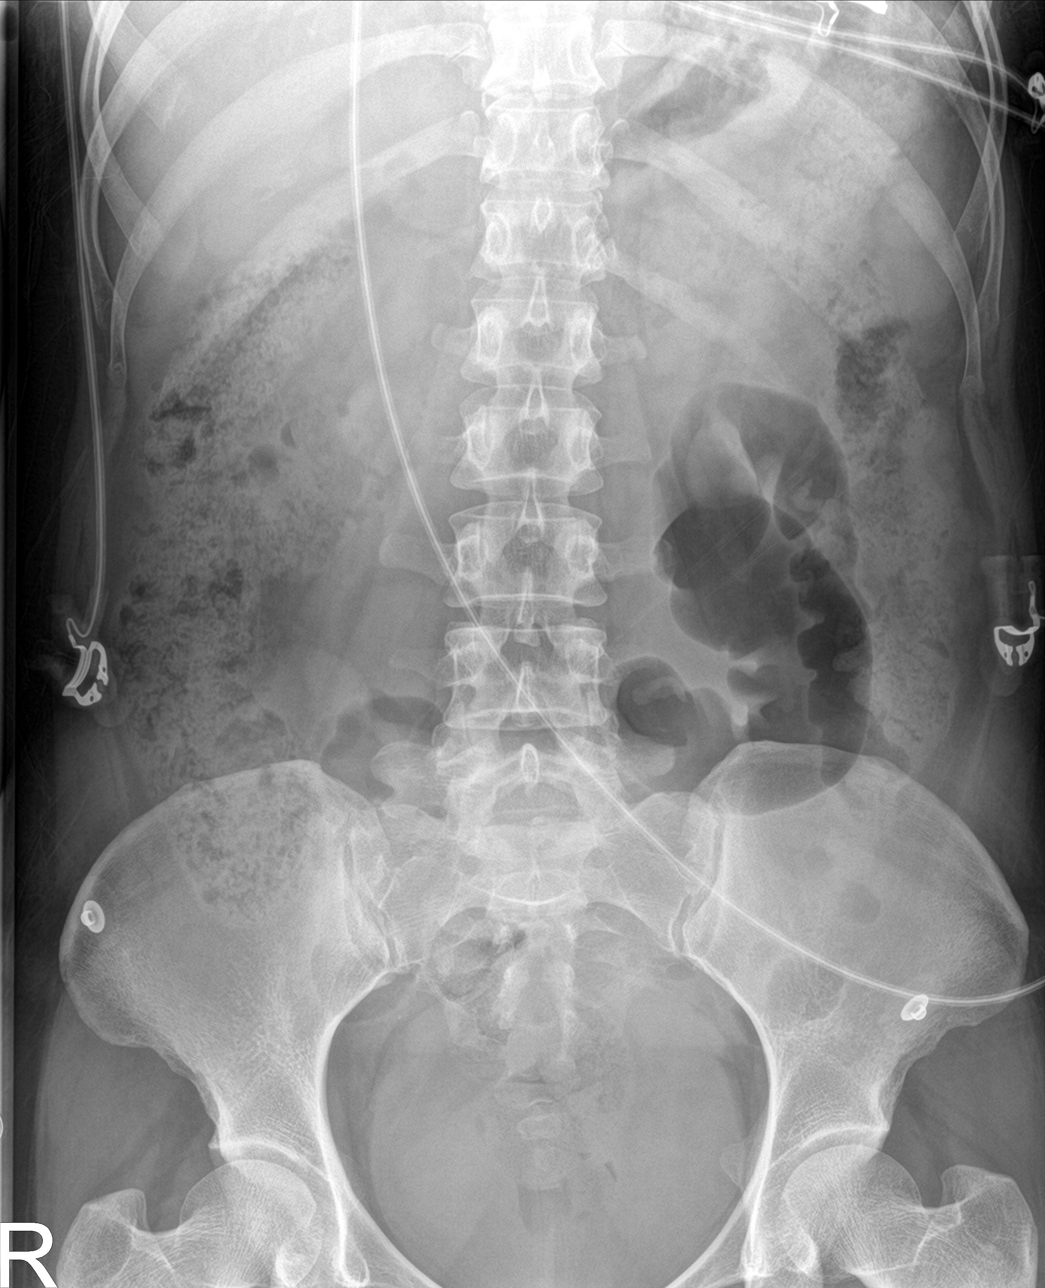

[3 of 3 positions shown; findings below may reference images not displayed]

FINDINGS: Chest:

Cardiomediastinal silhouette unchanged in size and contour. Low lung
volumes accentuates the interstitium in the vasculature.

No pneumothorax or pleural effusion.

No confluent airspace disease.

Abdomen:

Gas within stomach, small bowel, colon. Formed stool within right
colon, splenic flexure descending colon, and the rectum.

No abnormally distended small bowel or colon. No air-fluid levels on
the upright image.

No unexpected calcifications.

No radiopaque foreign body.

Rounded soft tissue density in the anatomic pelvis, likely
represents urinary bladder or fibroid uterus.
IMPRESSION: Chest:

No radiographic evidence of acute cardiopulmonary disease.

Abdomen:

Moderate stool burden without evidence of obstruction.

## 2019-04-19 ENCOUNTER — Ambulatory Visit: Payer: Self-pay

## 2019-06-10 ENCOUNTER — Other Ambulatory Visit (HOSPITAL_COMMUNITY): Payer: Self-pay | Admitting: Obstetrics and Gynecology

## 2019-06-10 ENCOUNTER — Other Ambulatory Visit: Payer: Self-pay | Admitting: Obstetrics and Gynecology

## 2019-06-10 DIAGNOSIS — R102 Pelvic and perineal pain: Secondary | ICD-10-CM

## 2019-06-17 ENCOUNTER — Ambulatory Visit (HOSPITAL_COMMUNITY)
Admission: RE | Admit: 2019-06-17 | Discharge: 2019-06-17 | Disposition: A | Discharge status: Home / Self Care | Payer: Self-pay | Source: Ambulatory Visit | Attending: Obstetrics and Gynecology | Admitting: Obstetrics and Gynecology

## 2019-06-17 ENCOUNTER — Other Ambulatory Visit: Payer: Self-pay

## 2019-06-17 DIAGNOSIS — R102 Pelvic and perineal pain: Secondary | ICD-10-CM | POA: Insufficient documentation

## 2019-07-11 ENCOUNTER — Encounter (HOSPITAL_COMMUNITY): Payer: Self-pay | Admitting: Emergency Medicine

## 2019-07-11 ENCOUNTER — Emergency Department (HOSPITAL_COMMUNITY): Payer: Self-pay

## 2019-07-11 ENCOUNTER — Other Ambulatory Visit: Payer: Self-pay

## 2019-07-11 ENCOUNTER — Emergency Department (HOSPITAL_COMMUNITY)
Admission: EM | Admit: 2019-07-11 | Discharge: 2019-07-12 | Disposition: A | Discharge status: Home / Self Care | Payer: Self-pay | Attending: Family Medicine | Admitting: Family Medicine

## 2019-07-11 DIAGNOSIS — K529 Noninfective gastroenteritis and colitis, unspecified: Secondary | ICD-10-CM | POA: Insufficient documentation

## 2019-07-11 DIAGNOSIS — R109 Unspecified abdominal pain: Secondary | ICD-10-CM

## 2019-07-11 DIAGNOSIS — R1032 Left lower quadrant pain: Secondary | ICD-10-CM | POA: Insufficient documentation

## 2019-07-11 DIAGNOSIS — I10 Essential (primary) hypertension: Secondary | ICD-10-CM | POA: Insufficient documentation

## 2019-07-11 DIAGNOSIS — Z79899 Other long term (current) drug therapy: Secondary | ICD-10-CM | POA: Insufficient documentation

## 2019-07-11 LAB — CBC WITH DIFFERENTIAL/PLATELET
Abs Immature Granulocytes: 0.01 10*3/uL (ref 0.00–0.07)
Basophils Absolute: 0 10*3/uL (ref 0.0–0.1)
Basophils Relative: 0 %
Eosinophils Absolute: 0.1 10*3/uL (ref 0.0–0.5)
Eosinophils Relative: 1 %
HCT: 36.6 % (ref 36.0–46.0)
Hemoglobin: 12.6 g/dL (ref 12.0–15.0)
Immature Granulocytes: 0 %
Lymphocytes Relative: 29 %
Lymphs Abs: 2 10*3/uL (ref 0.7–4.0)
MCH: 30.4 pg (ref 26.0–34.0)
MCHC: 34.4 g/dL (ref 30.0–36.0)
MCV: 88.2 fL (ref 80.0–100.0)
Monocytes Absolute: 0.6 10*3/uL (ref 0.1–1.0)
Monocytes Relative: 9 %
Neutro Abs: 4.2 10*3/uL (ref 1.7–7.7)
Neutrophils Relative %: 61 %
Platelets: 251 10*3/uL (ref 150–400)
RBC: 4.15 MIL/uL (ref 3.87–5.11)
RDW: 13.2 % (ref 11.5–15.5)
WBC: 6.9 10*3/uL (ref 4.0–10.5)
nRBC: 0 % (ref 0.0–0.2)

## 2019-07-11 LAB — COMPREHENSIVE METABOLIC PANEL
ALT: 19 U/L (ref 0–44)
AST: 23 U/L (ref 15–41)
Albumin: 4.3 g/dL (ref 3.5–5.0)
Alkaline Phosphatase: 77 U/L (ref 38–126)
Anion gap: 8 (ref 5–15)
BUN: 13 mg/dL (ref 6–20)
CO2: 26 mmol/L (ref 22–32)
Calcium: 8.9 mg/dL (ref 8.9–10.3)
Chloride: 103 mmol/L (ref 98–111)
Creatinine, Ser: 0.68 mg/dL (ref 0.44–1.00)
GFR calc Af Amer: >60 mL/min (ref >60)
GFR calc non Af Amer: >60 mL/min (ref >60)
Glucose, Bld: 111 mg/dL — ABNORMAL HIGH (ref 70–99)
Potassium: 2.6 mmol/L — CL (ref 3.5–5.1)
Sodium: 137 mmol/L (ref 135–145)
Total Bilirubin: 0.6 mg/dL (ref 0.3–1.2)
Total Protein: 7.6 g/dL (ref 6.5–8.1)

## 2019-07-11 LAB — URINALYSIS, ROUTINE W REFLEX MICROSCOPIC
Bacteria, UA: NONE SEEN
Bilirubin Urine: NEGATIVE
Glucose, UA: NEGATIVE mg/dL
Ketones, ur: NEGATIVE mg/dL
Leukocytes,Ua: NEGATIVE
Nitrite: NEGATIVE
Protein, ur: NEGATIVE mg/dL
Specific Gravity, Urine: 1.015 (ref 1.005–1.030)
pH: 6 (ref 5.0–8.0)

## 2019-07-11 LAB — LIPASE, BLOOD: Lipase: 39 U/L (ref 11–51)

## 2019-07-11 MED ORDER — IOHEXOL 300 MG/ML  SOLN
100.0000 mL | Freq: Once | INTRAMUSCULAR | Status: AC | PRN
  Administered 2019-07-11: 100 mL via INTRAVENOUS

## 2019-07-11 MED ORDER — METRONIDAZOLE 500 MG PO TABS: 500.0000 mg | ORAL_TABLET | Freq: Three times a day (TID) | ORAL | 0 refills | Status: DC

## 2019-07-11 MED ORDER — METRONIDAZOLE 500 MG PO TABS
500.0000 mg | ORAL_TABLET | Freq: Once | ORAL | Status: AC
  Administered 2019-07-11: 500 mg via ORAL
  Filled 2019-07-11: qty 1

## 2019-07-11 MED ORDER — POTASSIUM CHLORIDE CRYS ER 20 MEQ PO TBCR
40.0000 meq | EXTENDED_RELEASE_TABLET | Freq: Once | ORAL | Status: AC
  Administered 2019-07-11: 23:00:00 40 meq via ORAL
  Filled 2019-07-11: qty 2

## 2019-07-11 MED ORDER — CIPROFLOXACIN HCL 250 MG PO TABS
500.0000 mg | ORAL_TABLET | Freq: Once | ORAL | Status: AC
  Administered 2019-07-11: 500 mg via ORAL
  Filled 2019-07-11: qty 2

## 2019-07-11 MED ORDER — ONDANSETRON HCL 4 MG/2ML IJ SOLN
4.0000 mg | INTRAMUSCULAR | Status: DC | PRN
  Administered 2019-07-11: 4 mg via INTRAVENOUS
  Filled 2019-07-11: qty 2

## 2019-07-11 MED ORDER — HYDROCODONE-ACETAMINOPHEN 5-325 MG PO TABS: ORAL_TABLET | ORAL | 0 refills | Status: DC

## 2019-07-11 MED ORDER — FAMOTIDINE IN NACL 20-0.9 MG/50ML-% IV SOLN
20.0000 mg | Freq: Once | INTRAVENOUS | Status: AC
  Administered 2019-07-11: 20:00:00 20 mg via INTRAVENOUS
  Filled 2019-07-11: qty 50

## 2019-07-11 MED ORDER — MORPHINE SULFATE (PF) 4 MG/ML IV SOLN
4.0000 mg | INTRAVENOUS | Status: DC | PRN
  Administered 2019-07-11: 4 mg via INTRAVENOUS
  Filled 2019-07-11: qty 1

## 2019-07-11 MED ORDER — CIPROFLOXACIN HCL 500 MG PO TABS: 500.0000 mg | ORAL_TABLET | Freq: Two times a day (BID) | ORAL | 0 refills | Status: DC

## 2019-07-11 NOTE — ED Notes (Signed)
[employer] interpreter Johnsie Cancel [phone]

## 2019-07-11 NOTE — Discharge Instructions (Signed)
Take the prescriptions as directed.  Call your regular medical doctor tomorrow to schedule a follow up appointment within the next 3 days. Call the GI doctor tomorrow to schedule a follow up appointment within the next week.  Return to the Emergency Department immediately sooner if worsening.  ° °

## 2019-07-11 NOTE — ED Triage Notes (Signed)
C/o left lateral pain (cramping), rating pain 8/10.  Denies urinary issues.  Pain for about one month but pain has increased today.

## 2019-07-11 NOTE — ED Provider Notes (Signed)
[facility] EMERGENCY DEPARTMENT Provider Note   CSN: [id number] Arrival date & time: 07/11/19  1814     History   Chief Complaint Chief Complaint  Patient presents with   Abdominal Pain    left lateral    Sylvia Nguyen is a 48 y.o. female.     Sylvia  Pt was seen at [facility].   Per pt, c/o gradual onset and persistence of constant left sided abd "pain" for the past 1 month.  Has been associated with no other symptoms.  Describes the abd pain as "cramping." States the pain worsens with movement and palpation of the area. States her PMD rx naproxen without improvement of her symptoms. Denies N/V, no diarrhea, no fevers, no back pain, no rash, no CP/SOB, no black or blood in stools, no dysuria/hematuria.      Past Medical History:  Diagnosis Date   Hypertension     There are no active problems to display for this patient.   History reviewed. No pertinent surgical history.   OB History   No obstetric history on file.      Home Medications    Prior to Admission medications   Medication Sig Start Date End Date Taking? Authorizing Provider  benzonatate (TESSALON) 100 MG capsule Take 2 capsules (200 mg total) by mouth 3 (three) times daily as needed for cough. 12/20/17   Evalee Jefferson, PA-C  cyclobenzaprine (FLEXERIL) 5 MG tablet Take 1 tablet (5 mg total) by mouth 3 (three) times daily as needed for muscle spasms. 06/16/17   Drenda Freeze, MD  lisinopril-hydrochlorothiazide (PRINZIDE,ZESTORETIC) 10-12.5 MG tablet Take 1 tablet by mouth daily.    [provider]  ondansetron (ZOFRAN ODT) 4 MG disintegrating tablet Take 1 tablet (4 mg total) by mouth every 8 (eight) hours as needed for nausea or vomiting. 06/18/17   Rancour, Annie Main, MD  predniSONE (DELTASONE) 20 MG tablet Take 2 tablets (40 mg total) by mouth daily. 06/16/17   Drenda Freeze, MD  promethazine (PHENERGAN) 25 MG tablet Take 1 tablet (25 mg total) by mouth every 6 (six) hours as needed for nausea  or vomiting. 06/16/17   Drenda Freeze, MD    Family History History reviewed. No pertinent family history.  Social History Social History   Tobacco Use   Smoking status: Never Smoker   Smokeless tobacco: Never Used  Substance Use Topics   Alcohol use: No   Drug use: No     Allergies   Patient has no known allergies.   Review of Systems Review of Systems ROS: Statement: All systems negative except as marked or noted in the Sylvia; Constitutional: Negative for fever and chills. ; ; Eyes: Negative for eye pain, redness and discharge. ; ; ENMT: Negative for ear pain, hoarseness, nasal congestion, sinus pressure and sore throat. ; ; Cardiovascular: Negative for chest pain, palpitations, diaphoresis, dyspnea and peripheral edema. ; ; Respiratory: Negative for cough, wheezing and stridor. ; ; Gastrointestinal: +abd pain. Negative for nausea, vomiting, diarrhea, blood in stool, hematemesis, jaundice and rectal bleeding. . ; ; Genitourinary: Negative for dysuria, flank pain and hematuria. ; ; Musculoskeletal: Negative for back pain and neck pain. Negative for swelling and trauma.; ; Skin: Negative for pruritus, rash, abrasions, blisters, bruising and skin lesion.; ; Neuro: Negative for headache, lightheadedness and neck stiffness. Negative for weakness, altered level of consciousness, altered mental status, extremity weakness, paresthesias, involuntary movement, seizure and syncope.       Physical Exam Updated Vital  Signs BP (!) 160/92 (BP Location: Right Arm)    Pulse 67    Temp 98.5 F (36.9 C) (Oral)    Resp 16    Ht 5\' 1"  (1.549 m)    Wt 63.5 kg    SpO2 100%    BMI 26.45 kg/m   Physical Exam 1845: Physical examination:  Nursing notes reviewed; Vital signs and O2 SAT reviewed;  Constitutional: Well developed, Well nourished, Well hydrated, In no acute distress; Head:  Normocephalic, atraumatic; Eyes: EOMI, PERRL, No scleral icterus; ENMT: Mouth and pharynx normal, Mucous membranes  moist; Neck: Supple, Full range of motion, No lymphadenopathy; Cardiovascular: Regular rate and rhythm, No gallop; Respiratory: Breath sounds clear & equal bilaterally, No wheezes.  Speaking full sentences with ease, Normal respiratory effort/excursion; Chest: Nontender, Movement normal; Abdomen: Soft, +left sided abd tenderness to palp. No rebound or guarding. Nondistended, Normal bowel sounds; Genitourinary: No CVA tenderness; Spine:  No midline CS, TS, LS tenderness. +TTP left lumbar paraspinal muscles. No rash.;; Extremities: Peripheral pulses normal, No tenderness, No edema, No calf edema or asymmetry.; Neuro: AA&Ox3, Major CN grossly intact.  Speech clear. No gross focal motor or sensory deficits in extremities. Climbs on and off stretcher easily by herself. Gait steady..; Skin: Color normal, Warm, Dry.   ED Treatments / Results  Labs (all labs ordered are listed, but only abnormal results are displayed)   EKG None  Radiology   Procedures Procedures (including critical care time)  Medications Ordered in ED Medications  famotidine (PEPCID) IVPB 20 mg premix (has no administration in time range)  morphine 4 MG/ML injection 4 mg (has no administration in time range)  ondansetron (ZOFRAN) injection 4 mg (has no administration in time range)  iohexol (OMNIPAQUE) 300 MG/ML solution 100 mL (has no administration in time range)     Initial Impression / Assessment and Plan / ED Course  I have reviewed the triage vital signs and the nursing notes.  Pertinent labs & imaging results that were available during my care of the patient were reviewed by me and considered in my medical decision making (see chart for details).     MDM Reviewed: previous chart, nursing note and vitals Reviewed previous: labs Interpretation: labs and CT scan   Results for orders placed or performed during the hospital encounter of 07/11/19  Comprehensive metabolic panel  Result Value Ref Range   Sodium 137  135 - 145 mmol/L   Potassium 2.6 (LL) 3.5 - 5.1 mmol/L   Chloride 103 98 - 111 mmol/L   CO2 26 22 - 32 mmol/L   Glucose, Bld 111 (H) 70 - 99 mg/dL   BUN 13 6 - 20 mg/dL   Creatinine, Ser 0.68 0.44 - 1.00 mg/dL   Calcium 8.9 8.9 - 10.3 mg/dL   Total Protein 7.6 6.5 - 8.1 g/dL   Albumin 4.3 3.5 - 5.0 g/dL   AST 23 15 - 41 U/L   ALT 19 0 - 44 U/L   Alkaline Phosphatase 77 38 - 126 U/L   Total Bilirubin 0.6 0.3 - 1.2 mg/dL   GFR calc non Af Amer >60 >60 mL/min   GFR calc Af Amer >60 >60 mL/min   Anion gap 8 5 - 15  Lipase, blood  Result Value Ref Range   Lipase 39 11 - 51 U/L  CBC with Differential  Result Value Ref Range   WBC 6.9 4.0 - 10.5 K/uL   RBC 4.15 3.87 - 5.11 MIL/uL   Hemoglobin 12.6 12.0 -  15.0 g/dL   HCT 36.6 36.0 - 46.0 %   MCV 88.2 80.0 - 100.0 fL   MCH 30.4 26.0 - 34.0 pg   MCHC 34.4 30.0 - 36.0 g/dL   RDW 13.2 11.5 - 15.5 %   Platelets 251 150 - 400 K/uL   nRBC 0.0 0.0 - 0.2 %   Neutrophils Relative % 61 %   Neutro Abs 4.2 1.7 - 7.7 K/uL   Lymphocytes Relative 29 %   Lymphs Abs 2.0 0.7 - 4.0 K/uL   Monocytes Relative 9 %   Monocytes Absolute 0.6 0.1 - 1.0 K/uL   Eosinophils Relative 1 %   Eosinophils Absolute 0.1 0.0 - 0.5 K/uL   Basophils Relative 0 %   Basophils Absolute 0.0 0.0 - 0.1 K/uL   Immature Granulocytes 0 %   Abs Immature Granulocytes 0.01 0.00 - 0.07 K/uL  Urinalysis, Routine w reflex microscopic  Result Value Ref Range   Color, Urine YELLOW YELLOW   APPearance CLEAR CLEAR   Specific Gravity, Urine 1.015 1.005 - 1.030   pH 6.0 5.0 - 8.0   Glucose, UA NEGATIVE NEGATIVE mg/dL   Hgb urine dipstick SMALL (A) NEGATIVE   Bilirubin Urine NEGATIVE NEGATIVE   Ketones, ur NEGATIVE NEGATIVE mg/dL   Protein, ur NEGATIVE NEGATIVE mg/dL   Nitrite NEGATIVE NEGATIVE   Leukocytes,Ua NEGATIVE NEGATIVE   RBC / HPF 0-5 0 - 5 RBC/hpf   WBC, UA 0-5 0 - 5 WBC/hpf   Bacteria, UA NONE SEEN NONE SEEN   Squamous Epithelial / LPF 0-5 0 - 5   Mucus PRESENT      Ct Abdomen Pelvis W Contrast Result Date: 07/11/2019 CLINICAL DATA:  Abdominal pain.  Left lateral pain and cramping. EXAM: CT ABDOMEN AND PELVIS WITH CONTRAST TECHNIQUE: Multidetector CT imaging of the abdomen and pelvis was performed using the standard protocol following bolus administration of intravenous contrast. CONTRAST:  166mL OMNIPAQUE IOHEXOL 300 MG/ML  SOLN COMPARISON:  CT dated June 16, 2017. FINDINGS: Lower chest: The lung bases are clear. The heart size is normal. Hepatobiliary: The liver is normal. Normal gallbladder.There is no biliary ductal dilation. Pancreas: Normal contours without ductal dilatation. No peripancreatic fluid collection. Spleen: No splenic laceration or hematoma. Adrenals/Urinary Tract: --Adrenal glands: No adrenal hemorrhage. --Right kidney/ureter: No hydronephrosis or perinephric hematoma. --Left kidney/ureter: No hydronephrosis or perinephric hematoma. --Urinary bladder: There may be mild wall thickening of the urinary bladder. Stomach/Bowel: --Stomach/Duodenum: No hiatal hernia or other gastric abnormality. Normal duodenal course and caliber. --Small bowel: No dilatation or inflammation. --Colon: There may be mild wall thickening of the descending colon and sigmoid colon. --Appendix: Normal. Vascular/Lymphatic: Normal course and caliber of the major abdominal vessels. --No retroperitoneal lymphadenopathy. --No mesenteric lymphadenopathy. --No pelvic or inguinal lymphadenopathy. Reproductive: Unremarkable Other: No ascites or free air. The abdominal wall is normal. Musculoskeletal. No acute displaced fractures. IMPRESSION: 1. Possible mild wall thickening of the descending colon and sigmoid colon. This could represent infectious or inflammatory colitis in the appropriate clinical setting. This appearance may also be secondary to underdistention. 2. Possible wall thickening of the urinary bladder. Correlation with urinalysis is recommended. Electronically Signed   By:  Constance Holster M.D.   On: 07/11/2019 21:58    Letica E Calef was evaluated in Emergency Department on 07/11/2019 for the symptoms described in the history of present illness. She was evaluated in the context of the global COVID-19 pandemic, which necessitated consideration that the patient might be at risk for infection with the  SARS-CoV-2 virus that causes COVID-19. Institutional protocols and algorithms that pertain to the evaluation of patients at risk for COVID-19 are in a state of rapid change based on information released by regulatory bodies including the CDC and federal and state organizations. These policies and algorithms were followed during the patient's care in the ED.    2245:  Potassium repleted PO. Pt has tol PO well while in the ED without N/V.  No stooling while in the ED.  Abd benign, resps easy, VSS. Feels better and wants to go home now. CT as above; tx abx, f/u GI MD. Dx and testing, d/w pt.  Questions answered.  Verb understanding, agreeable to d/c home with outpt f/u.      Final Clinical Impressions(s) / ED Diagnoses   Final diagnoses:  None    ED Discharge Orders    None       Francine Graven, DO 07/15/19 1826

## 2019-07-11 NOTE — ED Notes (Signed)
Date and time results received: 07/11/19 @19 ;59 (use smartphrase ".now" to insert current time)  Test: potassium  Critical Value: 2.6  Name of Provider Notified: Dr Thurnell Garbe  Orders Received? Or Actions Taken?: none

## 2019-07-11 NOTE — ED Notes (Signed)
[employer] interpreter # [id number]/Luis to explain po medications. At this time pt states she would like something for pain, pt drove herself to the hospital and instructed her to see if someone could pick her up and we can give her something for pain.

## 2019-07-12 NOTE — ED Notes (Signed)
Pt ambulatory to waiting room. Pt verbalized understanding of discharge instructions.   

## 2019-08-06 ENCOUNTER — Other Ambulatory Visit: Payer: Self-pay

## 2019-08-06 DIAGNOSIS — Z20822 Contact with and (suspected) exposure to covid-19: Secondary | ICD-10-CM

## 2019-08-08 LAB — NOVEL CORONAVIRUS, NAA: SARS-CoV-2, NAA: NOT DETECTED

## 2019-08-10 ENCOUNTER — Telehealth: Payer: Self-pay | Admitting: *Deleted

## 2019-08-10 NOTE — Telephone Encounter (Signed)
Pt called in speaking Spanish.   I let her know I would call her back using Spanish interpreter.   She said,  "OK".  Using [employer] [phone] I called her back.   She was requesting her COVID-19 results.   I let her know her result was negative.     She wanted a copy.   She does not have e mail so wasn't able to set up MyChart.   I verified her address and let her know it will be mailed to her in 5-7 business days.   She was ok with this.  I sent the information to the [facility] [place] so someone could send it over to [facility] to be mailed.

## 2019-10-06 ENCOUNTER — Encounter: Payer: Self-pay | Admitting: Gastroenterology

## 2019-10-18 ENCOUNTER — Other Ambulatory Visit: Payer: Self-pay

## 2019-10-18 ENCOUNTER — Encounter: Payer: Self-pay | Admitting: Gastroenterology

## 2019-10-18 ENCOUNTER — Ambulatory Visit (INDEPENDENT_AMBULATORY_CARE_PROVIDER_SITE_OTHER): Payer: Self-pay | Admitting: Gastroenterology

## 2019-10-18 DIAGNOSIS — R933 Abnormal findings on diagnostic imaging of other parts of digestive tract: Secondary | ICD-10-CM | POA: Insufficient documentation

## 2019-10-18 DIAGNOSIS — R1032 Left lower quadrant pain: Secondary | ICD-10-CM

## 2019-10-18 DIAGNOSIS — K5900 Constipation, unspecified: Secondary | ICD-10-CM

## 2019-10-18 MED ORDER — POLYETHYLENE GLYCOL 3350 17 G PO PACK: 17.0000 g | PACK | Freq: Every day | ORAL | 5 refills | Status: DC

## 2019-10-18 NOTE — Assessment & Plan Note (Signed)
Does not 48 year old Hispanic speaking female, presented with formal interpreter, with 2-year history of intermittent left lower quadrant pain.  Seen in the ED in July, CT with questionable wall thickening of the descending and sigmoid colon but also could be secondary to underdistention.  Chronically she has constipation.  Some improvement in symptoms with bowel movements.  In addition she has chronic back pain, complains of numbness in both feet which started about 1 month ago.  Prior CT lumbar region as outlined above.  It is not clear that all the symptoms are related.  I suspect she may have some radiculopathy occurring from her back issues that explain her extremity numbness.  Encouraged her to see her PCP for this.  We will manage her constipation.  She is using dietary means currently to control her symptoms but feels like is not effective enough.  Add MiraLAX 17 g daily, hold for diarrhea.  Also plan for colonoscopy in the near future to evaluate abnormal colon findings on CT.  I have discussed the risks, alternatives, benefits with regards to but not limited to the risk of reaction to medication, bleeding, infection, perforation and the patient is agreeable to proceed. Written consent to be obtained.  If she has worsening or persistent left lower quadrant pain, fever, etc. she will call in the interim.

## 2019-10-18 NOTE — Patient Instructions (Signed)
COVID test date corrected after appt letter printed on English and Spanish version. Pt aware COVID test is 12/16/19 at 10:00am.

## 2019-10-18 NOTE — Progress Notes (Signed)
Primary Care Physician:  Auburn Bilberry, MD  Primary Gastroenterologist:  Barney Drain, MD   Chief Complaint  Patient presents with  . Abdominal Pain    llq pain then starts spreading all over like intestines are cramping up, going on for several years. 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 Constipation    can go 1-2 days w/o BM but then has to take laxative to have BM. No RB    HPI:  Sylvia Nguyen is a 48 y.o. female here to follow-up abnormal findings of colon on CT.  She was seen in the ED back in July.  At that time presented with left-sided abdominal pain for 1 month.  Described as crampy.  Worse with movement and palpation.  No associated bowel issues.  CT with possible mild wall thickening of the descending colon and sigmoid colon versus underdistention.  She was advised by the ED to follow-up with GI.  Patient presents with formal interpreter today.  She complains of abdominal pain that starts at [facility] and then travels to the left flank and eventually throughout the whole abdomen. Takes naproxen for the pain which helps. Takes several times per week.  She has had this pain off and on for over 2 years.  Symptoms have been more persistent since July.  At that time symptoms started after eating something spicy.  Developed significant left lower quadrant pain, her worst episode, went to the ED.  Labs were unremarkable.  CT showed left-sided colon wall thickening versus underdistention.  He was treated with antibiotic therapy.  Symptoms really did not change.    BM every 2-3 days. Prune tea or smoothie with flax seed/prunes every few days.  Seems to help have a bowel movement.  She states her pain seems to travel down into her left thigh and left ribs.  She denies any urinary problems.  Sometimes her pain is better if she has a bowel movement.  For the past 1 month she has had some numbness and tingling in both feet.  She had a CT lumbar region 2 years ago with some mild bulging of the  disc at L4-L5.  Narrowing of the lateral recess at L4-L5.  Definite neural compression not established but potentially could cause some nerve irritation.   She denies any upper GI symptoms.  She does have some nausea with abdominal pain at times.  No vomiting.  No heartburn.  Her abdominal pain typically is more of a nagging pain around a level of 4-5 out of 10.  No prior colonoscopy.  Current Outpatient Medications  Medication Sig Dispense Refill  . acetaminophen (TYLENOL) 500 MG tablet Take 500 mg by mouth every 6 (six) hours as needed.    Marland Kitchen amLODipine (NORVASC) 10 MG tablet Take 10 mg by mouth daily.    . polyethylene glycol (MIRALAX) 17 g packet Take 17 g by mouth daily. Hold a day if diarrhea. 28 each 5   No current facility-administered medications for this visit.     Allergies as of 10/18/2019  . (No Known Allergies)    Past Medical History:  Diagnosis Date  . Hypertension   . Uterine fibroid     History reviewed. No pertinent surgical history.  Family History  Problem Relation Age of Onset  . Other Father        MVA  . Colon cancer Neg Hx     Social History   Socioeconomic History  . Marital status: Single    Spouse  name: Not on file  . Number of children: Not on file  . Years of education: Not on file  . Highest education level: Not on file  Occupational History  . Not on file  Social Needs  . Financial resource strain: Not on file  . Food insecurity    Worry: Not on file    Inability: Not on file  . Transportation needs    Medical: Not on file    Non-medical: Not on file  Tobacco Use  . Smoking status: Never Smoker  . Smokeless tobacco: Never Used  Substance and Sexual Activity  . Alcohol use: No  . Drug use: No  . Sexual activity: Not on file  Lifestyle  . Physical activity    Days per week: Not on file    Minutes per session: Not on file  . Stress: Not on file  Relationships  . Social Herbalist on phone: Not on file    Gets  together: Not on file    Attends religious service: Not on file    Active member of club or organization: Not on file    Attends meetings of clubs or organizations: Not on file    Relationship status: Not on file  . Intimate partner violence    Fear of current or ex partner: Not on file    Emotionally abused: Not on file    Physically abused: Not on file    Forced sexual activity: Not on file  Other Topics Concern  . Not on file  Social History Narrative  . Not on file      ROS:  General: Negative for anorexia, weight loss, fever, chills, fatigue, weakness. Eyes: Negative for vision changes.  ENT: Negative for hoarseness, difficulty swallowing , nasal congestion. CV: Negative for chest pain, angina, palpitations, dyspnea on exertion, peripheral edema.  Respiratory: Negative for dyspnea at rest, dyspnea on exertion, cough, sputum, wheezing.  GI: See history of present illness. GU:  Negative for dysuria, hematuria, urinary incontinence, urinary frequency, nocturnal urination.  MS: Negative for joint pain, positive low back pain.  Derm: Negative for rash or itching.  Neuro: Negative for weakness,   seizure, frequent headaches, memory loss, confusion.  See HPI Psych: Negative for anxiety, depression, suicidal ideation, hallucinations.  Endo: Negative for unusual weight change.  Heme: Negative for bruising or bleeding. Allergy: Negative for rash or hives.    Physical Examination:  BP 124/82   Pulse 62   Temp (!) 97.1 F (36.2 C) (Oral)   Ht 5\' 6"  (1.676 m)   Wt 130 lb 3.2 oz (59.1 kg)   BMI 21.01 kg/m    General: Well-nourished, well-developed in no acute distress.  Head: Normocephalic, atraumatic.   Eyes: Conjunctiva pink, no icterus. Mouth: Oropharyngeal mucosa moist and pink , no lesions erythema or exudate. Neck: Supple without thyromegaly, masses, or lymphadenopathy.  Lungs: Clear to auscultation bilaterally.  Heart: Regular rate and rhythm, no murmurs rubs or  gallops.  Abdomen: Bowel sounds are normal, mild left lower quadrant tenderness, nondistended, no hepatosplenomegaly or masses, no abdominal bruits or    hernia , no rebound or guarding.  No CVA tenderness Rectal: Not performed Extremities: No lower extremity edema. No clubbing or deformities.  Neuro: Alert and oriented x 4 , grossly normal neurologically.  Skin: Warm and dry, no rash or jaundice.   Psych: Alert and cooperative, normal mood and affect.  Labs: Lab Results  Component Value Date   CREATININE 0.68 07/11/2019  BUN 13 07/11/2019   NA 137 07/11/2019   K 2.6 (LL) 07/11/2019   CL 103 07/11/2019   CO2 26 07/11/2019   Lab Results  Component Value Date   WBC 6.9 07/11/2019   HGB 12.6 07/11/2019   HCT 36.6 07/11/2019   MCV 88.2 07/11/2019   PLT 251 07/11/2019   Lab Results  Component Value Date   ALT 19 07/11/2019   AST 23 07/11/2019   ALKPHOS 77 07/11/2019   BILITOT 0.6 07/11/2019   Lab Results  Component Value Date   LIPASE 39 07/11/2019    Imaging Studies: No results found.  CT abdomen pelvis with contrast back in July with possible mild wall thickening of the urinary bladder, possible mild wall thickening of the descending colon and sigmoid colon, could be secondary to underdistention.

## 2019-10-18 NOTE — Patient Instructions (Addendum)
1. Take Miralax one capful every day to help with constipation. Hold a day if you get diarrhea.  2. Colonoscopy as scheduled. See separate instructions.  3. If you have worsening abdominal pain, please call our office at [phone].     Tome Miralax una tapa llena una o dos veces al da para el estreimiento. Mantenga un da si tiene heces blandas frecuentes.  Colonoscopia segn lo programado. consulte las instrucciones por separado.  Si tiene un dolor abdominal que empeora o persiste, llmeme al [phone].

## 2019-11-11 ENCOUNTER — Telehealth: Payer: Self-pay

## 2019-11-11 NOTE — Telephone Encounter (Signed)
COVID testing site will be closed 12/16/19. COVID test needs to be rescheduled to 12/18/19. Tried to call pt via interpreter line (spoke to Loraine LA:[phone]), he LMOVM for return call.

## 2019-11-11 NOTE — Telephone Encounter (Signed)
Pt called office, COVID test rescheduled to 12/18/19 at 9:05am. Verbalized understanding. Letter mailed.

## 2019-12-02 ENCOUNTER — Other Ambulatory Visit: Payer: Self-pay

## 2019-12-02 DIAGNOSIS — Z20822 Contact with and (suspected) exposure to covid-19: Secondary | ICD-10-CM

## 2019-12-03 ENCOUNTER — Other Ambulatory Visit: Payer: Self-pay

## 2019-12-03 ENCOUNTER — Emergency Department (HOSPITAL_COMMUNITY): Payer: Self-pay

## 2019-12-03 ENCOUNTER — Encounter (HOSPITAL_COMMUNITY): Payer: Self-pay | Admitting: Emergency Medicine

## 2019-12-03 ENCOUNTER — Emergency Department (HOSPITAL_COMMUNITY)
Admission: EM | Admit: 2019-12-03 | Discharge: 2019-12-03 | Disposition: A | Discharge status: Home / Self Care | Payer: Self-pay | Attending: Emergency Medicine | Admitting: Emergency Medicine

## 2019-12-03 DIAGNOSIS — Z79899 Other long term (current) drug therapy: Secondary | ICD-10-CM | POA: Insufficient documentation

## 2019-12-03 DIAGNOSIS — I10 Essential (primary) hypertension: Secondary | ICD-10-CM | POA: Insufficient documentation

## 2019-12-03 DIAGNOSIS — U071 COVID-19: Secondary | ICD-10-CM | POA: Insufficient documentation

## 2019-12-03 LAB — NOVEL CORONAVIRUS, NAA: SARS-CoV-2, NAA: DETECTED — AB

## 2019-12-03 MED ORDER — ALBUTEROL SULFATE HFA 108 (90 BASE) MCG/ACT IN AERS: 1.0000 | INHALATION_SPRAY | Freq: Four times a day (QID) | RESPIRATORY_TRACT | 0 refills | Status: DC | PRN

## 2019-12-03 NOTE — ED Provider Notes (Signed)
[facility] EMERGENCY DEPARTMENT Provider Note   CSN: [id number] Arrival date & time: 12/03/19  1719     History Chief Complaint  Patient presents with  . covid +    Sylvia Nguyen is a 48 y.o. female.  48yo female with 3 days of dry cough, sore throat, congestion, SHOB, chest soreness. Chest soreness is worse with coughing.  Denies vomiting, diarrhea, body aches, loss of sense of taste. No history of asthma, chronic lung disease, is a non smoker.  Sylvia Nguyen was evaluated in Emergency Department on 12/03/2019 for the symptoms described in the history of present illness. She was evaluated in the context of the global COVID-19 pandemic, which necessitated consideration that the patient might be at risk for infection with the SARS-CoV-2 virus that causes COVID-19. Institutional protocols and algorithms that pertain to the evaluation of patients at risk for COVID-19 are in a state of rapid change based on information released by regulatory bodies including the CDC and federal and state organizations. These policies and algorithms were followed during the patient's care in the ED.   The history is limited by a language barrier. A language interpreter was used.       Past Medical History:  Diagnosis Date  . Hypertension   . Uterine fibroid     Patient Active Problem List   Diagnosis Date Noted  . Abdominal pain, left lower quadrant 10/18/2019  . Abnormal CT scan, colon 10/18/2019  . Constipation 10/18/2019    History reviewed. No pertinent surgical history.   OB History   No obstetric history on file.     Family History  Problem Relation Age of Onset  . Other Father        MVA  . Colon cancer Neg Hx     Social History   Tobacco Use  . Smoking status: Never Smoker  . Smokeless tobacco: Never Used  Substance Use Topics  . Alcohol use: No  . Drug use: No    Home Medications Prior to Admission medications   Medication Sig Start Date  End Date Taking? Authorizing Provider  acetaminophen (TYLENOL) 500 MG tablet Take 500 mg by mouth every 6 (six) hours as needed.    [provider]  albuterol (VENTOLIN HFA) 108 (90 Base) MCG/ACT inhaler Inhale 1 puff into the lungs every 6 (six) hours as needed for wheezing or shortness of breath. 12/03/19   Tacy Learn, PA-C  amLODipine (NORVASC) 10 MG tablet Take 10 mg by mouth daily.    [provider]  polyethylene glycol (MIRALAX) 17 g packet Take 17 g by mouth daily. Hold a day if diarrhea. 10/18/19   Mahala Menghini, PA-C    Allergies    Patient has no known allergies.  Review of Systems   Review of Systems  Constitutional: Negative for chills, diaphoresis and fever.  HENT: Positive for congestion and sore throat.   Respiratory: Positive for cough and shortness of breath.   Cardiovascular: Positive for chest pain.  Gastrointestinal: Negative for constipation, diarrhea, nausea and vomiting.  Musculoskeletal: Positive for arthralgias and myalgias.  Skin: Negative for rash and wound.  Allergic/Immunologic: Negative for immunocompromised state.  All other systems reviewed and are negative.   Physical Exam Updated Vital Signs BP 127/82 (BP Location: Right Arm)   Pulse 70   Temp 98.5 F (36.9 C) (Oral)   Resp 16   SpO2 100%   Physical Exam Vitals and nursing note reviewed.  Constitutional:  General: She is not in acute distress.    Appearance: She is well-developed. She is not diaphoretic.  HENT:     Head: Normocephalic and atraumatic.  Cardiovascular:     Rate and Rhythm: Normal rate and regular rhythm.     Pulses: Normal pulses.     Heart sounds: Normal heart sounds.  Pulmonary:     Effort: Pulmonary effort is normal.     Breath sounds: Normal breath sounds.  Musculoskeletal:     Cervical back: Neck supple. No tenderness.  Skin:    General: Skin is warm and dry.     Findings: No erythema or rash.  Neurological:     Mental Status: She  is alert and oriented to person, place, and time.  Psychiatric:        Behavior: Behavior normal.     ED Results / Procedures / Treatments   Labs (all labs ordered are listed, but only abnormal results are displayed) Labs Reviewed - No data to display  EKG None  Radiology DG Chest [facility] 1 View  Result Date: 12/03/2019 CLINICAL DATA:  Cough EXAM: PORTABLE CHEST 1 VIEW COMPARISON:  Portable exam 1746 hours compared to 12/20/2017 FINDINGS: Normal heart size, mediastinal contours, and pulmonary vascularity. Minimal patchy density mid RIGHT lung, favor infiltrate though pulmonary nodule not excluded. Remaining lungs clear. No pleural effusion or pneumothorax. Osseous structures unremarkable. IMPRESSION: Questionable RIGHT mid lung infiltrate though pulmonary nodule not completely excluded. Followup PA and lateral chest X-ray is recommended in 3-4 weeks following trial of antibiotic therapy to ensure resolution and exclude underlying malignancy. Electronically Signed   By: Lavonia Dana M.D.   On: 12/03/2019 18:10    Procedures Procedures (including critical care time)  Medications Ordered in ED Medications - No data to display  ED Course  I have reviewed the triage vital signs and the nursing notes.  Pertinent labs & imaging results that were available during my care of the patient were reviewed by me and considered in my medical decision making (see chart for details).  Clinical Course as of Dec 02 1822  Fri Dec 02, 2828  2313 48 year old female with complaint of cough, congestion, sore throat and soreness in her chest.  Patient symptoms started 3 days ago, tested positive for Covid yesterday. On exam, patient is well appearing, vitals are reassuring, patient is ambulatory around the room with a swift gait, maintains oxygen saturation of 100%, does not experience shortness of breath with exertion.  No chest wall tenderness.  EKG without acute ischemic changes, chest pain is related to her  cough, do not suspect ACS. Patient was given albuterol inhaler, recommend Coricidin HBP due to her history of hypertension.  Review of chest x-ray, concern for right pulmonary nodule versus infiltrate, suspect this is actually related to patient having Covid.  Patient was advised to follow-up with her PCP for 2 view chest x-ray in 2 weeks.   [LM]    Clinical Course User Index [LM] Roque Lias   MDM Rules/Calculators/A&P      Final Clinical Impression(s) / ED Diagnoses Final diagnoses:  [id number]    Rx / DC Orders ED Discharge Orders         Ordered    albuterol (VENTOLIN HFA) 108 (90 Base) MCG/ACT inhaler  Every 6 hours PRN     12/03/19 1816           Tacy Learn, PA-C 12/03/19 1823    Lucrezia Starch, MD 12/04/19 1540

## 2019-12-03 NOTE — ED Triage Notes (Signed)
Pt reports 3 days of cough, chills and tightness in chest - pt tested + for covid yesterday.

## 2019-12-03 NOTE — ED Notes (Signed)
The pt tested positive for covid at [facility] yesterday

## 2019-12-03 NOTE — Discharge Instructions (Addendum)
Follow up with your doctor for repeat 2 view chest x-ray, question COVID vs pulmonary nodule on chest x-ray. Take Coricidin HBP for your cough. This is over the counter at the pharmacy.    Lleve estos documentos a su mdico para el seguimiento. Necesita una radiografa de trax de 2 vistas en 14 das. Tome Coricidin HBP para la tos. Esto es sin Event organiser.

## 2019-12-07 ENCOUNTER — Telehealth: Payer: Self-pay

## 2019-12-07 NOTE — Telephone Encounter (Signed)
Melanie at [facility] called office, pt tested positive for COVID 12/02/19. TCS scheduled for 12/20/19 will need to be rescheduled d/t procedure needs to be at least 21 days after testing positive.  Called pt via interpreter line. Angela Burke, ID# [phone]. TCS rescheduled to 03/30/20 at 12:00pm. COVID test 03/28/20 at 3:00pm. Appt letter mailed with new procedure instructions. Endo scheduler informed.

## 2019-12-16 ENCOUNTER — Other Ambulatory Visit (HOSPITAL_COMMUNITY): Payer: Self-pay

## 2019-12-18 ENCOUNTER — Other Ambulatory Visit (HOSPITAL_COMMUNITY): Payer: Self-pay

## 2020-03-28 ENCOUNTER — Other Ambulatory Visit: Payer: Self-pay

## 2020-03-28 ENCOUNTER — Other Ambulatory Visit (HOSPITAL_COMMUNITY)
Admission: RE | Admit: 2020-03-28 | Discharge: 2020-03-28 | Disposition: A | Discharge status: Home / Self Care | Payer: HRSA Program | Source: Ambulatory Visit | Attending: Gastroenterology | Admitting: Gastroenterology

## 2020-03-28 DIAGNOSIS — Z01812 Encounter for preprocedural laboratory examination: Secondary | ICD-10-CM | POA: Insufficient documentation

## 2020-03-28 DIAGNOSIS — Z20822 Contact with and (suspected) exposure to covid-19: Secondary | ICD-10-CM | POA: Insufficient documentation

## 2020-03-29 LAB — SARS CORONAVIRUS 2 (TAT 6-24 HRS): SARS Coronavirus 2: NEGATIVE

## 2020-03-30 ENCOUNTER — Ambulatory Visit (HOSPITAL_COMMUNITY)
Admission: RE | Admit: 2020-03-30 | Discharge: 2020-03-30 | Disposition: A | Discharge status: Home / Self Care | Payer: Self-pay | Attending: Gastroenterology | Admitting: Gastroenterology

## 2020-03-30 ENCOUNTER — Encounter (HOSPITAL_COMMUNITY)
Admission: RE | Disposition: A | Discharge status: Home / Self Care | Payer: Self-pay | Source: Home / Self Care | Attending: Gastroenterology

## 2020-03-30 DIAGNOSIS — K644 Residual hemorrhoidal skin tags: Secondary | ICD-10-CM | POA: Insufficient documentation

## 2020-03-30 DIAGNOSIS — R1032 Left lower quadrant pain: Secondary | ICD-10-CM

## 2020-03-30 DIAGNOSIS — K648 Other hemorrhoids: Secondary | ICD-10-CM | POA: Insufficient documentation

## 2020-03-30 DIAGNOSIS — Z79899 Other long term (current) drug therapy: Secondary | ICD-10-CM | POA: Insufficient documentation

## 2020-03-30 DIAGNOSIS — R933 Abnormal findings on diagnostic imaging of other parts of digestive tract: Secondary | ICD-10-CM

## 2020-03-30 DIAGNOSIS — K621 Rectal polyp: Secondary | ICD-10-CM | POA: Insufficient documentation

## 2020-03-30 DIAGNOSIS — I10 Essential (primary) hypertension: Secondary | ICD-10-CM | POA: Insufficient documentation

## 2020-03-30 DIAGNOSIS — K5900 Constipation, unspecified: Secondary | ICD-10-CM | POA: Insufficient documentation

## 2020-03-30 DIAGNOSIS — Q438 Other specified congenital malformations of intestine: Secondary | ICD-10-CM | POA: Insufficient documentation

## 2020-03-30 HISTORY — PX: POLYPECTOMY: SHX5525

## 2020-03-30 HISTORY — PX: COLONOSCOPY: SHX5424

## 2020-03-30 SURGERY — COLONOSCOPY
Anesthesia: Moderate Sedation

## 2020-03-30 MED ORDER — MIDAZOLAM HCL 5 MG/5ML IJ SOLN
INTRAMUSCULAR | Status: AC
  Filled 2020-03-30: qty 10

## 2020-03-30 MED ORDER — LINACLOTIDE 72 MCG PO CAPS: ORAL_CAPSULE | ORAL | 11 refills | Status: DC

## 2020-03-30 MED ORDER — MEPERIDINE HCL 100 MG/ML IJ SOLN
INTRAMUSCULAR | Status: AC
  Filled 2020-03-30: qty 2

## 2020-03-30 MED ORDER — MEPERIDINE HCL 100 MG/ML IJ SOLN
INTRAMUSCULAR | Status: DC | PRN
  Administered 2020-03-30 (×3): 25 mg via INTRAVENOUS

## 2020-03-30 MED ORDER — MIDAZOLAM HCL 5 MG/5ML IJ SOLN
INTRAMUSCULAR | Status: DC | PRN
  Administered 2020-03-30 (×3): 2 mg via INTRAVENOUS

## 2020-03-30 MED ORDER — STERILE WATER FOR IRRIGATION IR SOLN
Status: DC | PRN
  Administered 2020-03-30: 1.5 mL

## 2020-03-30 MED ORDER — SODIUM CHLORIDE 0.9 % IV SOLN: INTRAVENOUS | Status: DC

## 2020-03-30 NOTE — Op Note (Signed)
[facility] Patient Name: Sylvia Nguyen Procedure Date: 03/30/2020 12:49 PM MRN: [id number] Date of Birth: 12/11/1971 Attending MD: Barney Drain MD, MD CSN: [id number] Age: 49 Admit Type: Outpatient Procedure:                Colonoscopy WITH COLD SNARE POLYPECTOMY Indications:              Abnormal CT of the GI tract Providers:                Barney Drain MD, MD, Charlsie Quest. Theda Sers RN, RN,                            Nelma Rothman, Technician Referring MD:             Colon Branch, MD Medicines:                Meperidine 75 mg IV, Midazolam 6 mg IV Complications:            No immediate complications. Estimated Blood Loss:     Estimated blood loss was minimal. Procedure:                Pre-Anesthesia Assessment:                           - Prior to the procedure, a History and Physical                            was performed, and patient medications and                            allergies were reviewed. The patient's tolerance of                            previous anesthesia was also reviewed. The risks                            and benefits of the procedure and the sedation                            options and risks were discussed with the patient.                            All questions were answered, and informed consent                            was obtained. Prior Anticoagulants: The patient has                            taken no previous anticoagulant or antiplatelet                            agents. ASA Grade Assessment: II - A patient with                            mild systemic disease. After reviewing the risks  and benefits, the patient was deemed in                            satisfactory condition to undergo the procedure.                            After obtaining informed consent, the colonoscope                            was passed under direct vision. Throughout the                            procedure, the patient's blood  pressure, pulse, and                            oxygen saturations were monitored continuously. The                            [id number] scope was introduced through                            the anus and advanced to the 5 cm into the ileum.                            The colonoscopy was somewhat difficult due to a                            tortuous colon. Successful completion of the                            procedure was aided by straightening and shortening                            the scope to obtain bowel loop reduction and                            COLOWRAP. The patient tolerated the procedure                            fairly well. The quality of the bowel preparation                            was excellent. The terminal ileum, ileocecal valve,                            appendiceal orifice, and rectum were photographed. Scope In: 1:37:17 PM Scope Out: 1:54:28 PM Scope Withdrawal Time: 0 hours 12 minutes 41 seconds  Total Procedure Duration: 0 hours 17 minutes 11 seconds  Findings:      The terminal ileum appeared normal.      A 3 mm polyp was found in the rectum. The polyp was sessile. The polyp       was removed with a cold snare. Resection and retrieval were complete.      External  and internal hemorrhoids were found.      The recto-sigmoid colon, sigmoid colon and descending colon were       moderately tortuous. Impression:               - The examined portion of the ileum was normal.                           - One 3 mm polyp in the rectum, removed with a cold                            snare. Resected and retrieved.                           - External and internal hemorrhoids.                           - Tortuous LEFT colon. Moderate Sedation:      Moderate (conscious) sedation was administered by the endoscopy nurse       and supervised by the endoscopist. The following parameters were       monitored: oxygen saturation, heart rate, blood pressure, and  response       to care. Total physician intraservice time was 33 minutes. Recommendation:           - Patient has a contact number available for                            emergencies. The signs and symptoms of potential                            delayed complications were discussed with the                            patient. Return to normal activities tomorrow.                            Written discharge instructions were provided to the                            patient.                           - High fiber diet.                           - Continue present medications.                           - Await pathology results.                           - Repeat colonoscopy date to be determined after                            pending pathology results are reviewed for  surveillance.                           - Return to GI clinic in 4 months. Procedure Code(s):        --- Professional ---                           [phone], Colonoscopy, flexible; with removal of                            tumor(s), polyp(s), or other lesion(s) by snare                            technique                           99153, Moderate sedation; each additional 15                            minutes intraservice time                           G0500, Moderate sedation services provided by the                            same physician or other qualified health care                            professional performing a gastrointestinal                            endoscopic service that sedation supports,                            requiring the presence of an independent trained                            observer to assist in the monitoring of the                            patient's level of consciousness and physiological                            status; initial 15 minutes of intra-service time;                            patient age 47 years or older (additional time may                             be reported with [phone], as appropriate) Diagnosis Code(s):        --- Professional ---                           K64.8, Other hemorrhoids  K62.1, Rectal polyp                           R93.3, Abnormal findings on diagnostic imaging of                            other parts of digestive tract                           Q43.8, Other specified congenital malformations of                            intestine CPT copyright 2019 American Medical Association. All rights reserved. The codes documented in this report are preliminary and upon coder review may  be revised to meet current compliance requirements. Barney Drain, MD Barney Drain MD, MD 03/30/2020 3:44:12 PM This report has been signed electronically. Number of Addenda: 0

## 2020-03-30 NOTE — H&P (Signed)
Primary Care Physician:  Auburn Bilberry, MD Primary Gastroenterologist:  Dr. Oneida Alar  Pre-Procedure History & Physical: HPI:  Sylvia Nguyen is a 49 y.o. female here for Abnormal CT scan: LEFT COLON. HAS A BM Q3 DAYS WITH MEDS AND Q4 DAYS W/O. SPANISH SPEAKING ONLY-HISTORY OBTAINED VIA INTERPRETER [phone].    Marland Kitchen   Past Medical History:  Diagnosis Date  . Hypertension   . Uterine fibroid     No past surgical history on file.  Prior to Admission medications   Medication Sig Start Date End Date Taking? Authorizing Provider  acetaminophen (TYLENOL) 500 MG tablet Take 500 mg by mouth every 6 (six) hours as needed.   Yes [provider]  albuterol (VENTOLIN HFA) 108 (90 Base) MCG/ACT inhaler Inhale 1 puff into the lungs every 6 (six) hours as needed for wheezing or shortness of breath. 12/03/19  Yes Tacy Learn, PA-C  amLODipine (NORVASC) 10 MG tablet Take 10 mg by mouth daily.   Yes [provider]  polyethylene glycol (MIRALAX) 17 g packet Take 17 g by mouth daily. Hold a day if diarrhea. Patient not taking: Reported on 12/06/2019 10/18/19   Mahala Menghini, PA-C    Allergies as of 10/18/2019  . (No Known Allergies)    Family History  Problem Relation Age of Onset  . Other Father        MVA  . Colon cancer Neg Hx     Social History   Socioeconomic History  . Marital status: Single    Spouse name: Not on file  . Number of children: Not on file  . Years of education: Not on file  . Highest education level: Not on file  Occupational History  . Not on file  Tobacco Use  . Smoking status: Never Smoker  . Smokeless tobacco: Never Used  Substance and Sexual Activity  . Alcohol use: No  . Drug use: No  . Sexual activity: Not on file  Other Topics Concern  . Not on file  Social History Narrative  . Not on file   Social Determinants of Health   Financial Resource Strain:   . Difficulty of Paying Living Expenses:   Food Insecurity:   .  Worried About Charity fundraiser in the Last Year:   . Arboriculturist in the Last Year:   Transportation Needs:   . Film/video editor (Medical):   Marland Kitchen Lack of Transportation (Non-Medical):   Physical Activity:   . Days of Exercise per Week:   . Minutes of Exercise per Session:   Stress:   . Feeling of Stress :   Social Connections:   . Frequency of Communication with Friends and Family:   . Frequency of Social Gatherings with Friends and Family:   . Attends Religious Services:   . Active Member of Clubs or Organizations:   . Attends Archivist Meetings:   Marland Kitchen Marital Status:   Intimate Partner Violence:   . Fear of Current or Ex-Partner:   . Emotionally Abused:   Marland Kitchen Physically Abused:   . Sexually Abused:     Review of Systems: See HPI, otherwise negative ROS   Physical Exam: BP 117/73   Pulse 71   Temp 98 F (36.7 C) (Oral)   Resp 18   Ht 5' (1.524 m)   SpO2 100%   BMI 25.43 kg/m  General:   Alert,  pleasant and cooperative in NAD Head:  Normocephalic and atraumatic. Neck:  Supple; Lungs:  Clear throughout to auscultation.    Heart:  Regular rate and rhythm. Abdomen:  Soft, nontender and nondistended. Normal bowel sounds, without guarding, and without rebound.   Neurologic:  Alert and  oriented x4;  grossly normal neurologically.  Impression/Plan:     Abnormal CT scan: LEFT COLON  Plan:  1. TCS TODAY. DISCUSSED PROCEDURE, BENEFITS, & RISKS: < 1% chance of medication reaction, bleeding, perforation, ASPIRATION, or rupture of spleen/liver requiring surgery to fix it and missed polyps < 1 cm 10-20% of the time. TODAY.  DISCUSSED PROCEDURE, BENEFITS, & RISKS: < 1% chance of medication reaction, bleeding, perforation, or ASPIRATION.

## 2020-03-30 NOTE — Discharge Instructions (Signed)
EL DOLOR ABDOMINAL SE DEBE AL ESTREIMIENTO. Tiene hemorroides internas y externas de tamao moderado. TE [place]. EL COLON Y LA LTIMA PARTE DEL INTESTINO DELGADO SON NORMALES.  BEBA AGUA PARA MANTENER LA ORINA EN AMARILLO CLARO.  SIGA UNA DIETA ALTA EN FIBRA. EVITE LOS ELEMENTOS QUE CAUSEN HINCHAZN. VEA LA INFORMACIN A CONTINUACIN.  PARA REDUCIR EL ESTREIMIENTO, AGREGUE LINZESS 30 MINUTOS ANTES DEL [place] BM 1-3 HRS DESPUS DE LA DOSIS SI NO TOMA [place]. PUEDE CAUSAR DIARREA EXPLOSIVA.  USE LA PREPARACIN H CUATRO VECES AL DA SI ES NECESARIO PARA ALIVIAR EL DOLOR RECTAL/PRESIN/SANGRADO.  Holiday INQUIETUDES.  SEGUIMIENTO EN 4 MOS.   Prxima colonoscopia en 8 aos.  THE ABDOMINAL PAIN IS DUE TO CONSTIPATION.You have moderate size internal and external hemorrhoids. YOU HAD ONE POLYP REMOVED. YOUR COLON AND THE LAST PART OF THE SMALL BOWEL ARE NORMAL.  interpreter: [id number])  DRINK WATER TO KEEP YOUR URINE LIGHT YELLOW. FOLLOW A HIGH FIBER DIET. AVOID ITEMS THAT CAUSE BLOATING. SEE INFO BELOW. TO REDUCE CONSTIPATION, ADD LINZESS 30 MINS PRIOR TO BREAKFAST. YOU SHOULD HAVE A BM 1-3 HRS AFTER THE DOSE IF NOT TAKE THE LINZESS WITH BREAKFAST. IT CAN CAUSE EXPLOSIVE DIARRHEA. USE PREPARATION H FOUR TIMES  A DAY IF NEEDED TO RELIEVE RECTAL PAIN/PRESSURE/BLEEDING. PLEASE CALL WITH QUESTIONS OR CONCERNS. FOLLOW UP IN 4 MOS.  Next colonoscopy in 10 years.    Colonoscopa: cuidados posteriores (Colonoscopy, Care After) Estas indicaciones le proporcionan informacin general acerca de cmo deber cuidarse despus del procedimiento. El mdico tambin podr darle instrucciones especficas. Comunquese con el mdico si tiene algn problema o tiene preguntas despus del procedimiento. CUIDADOS EN EL HOGAR  No conduzca durante 24horas.  No firme papeles importantes ni use maquinaria pesada durante 24horas.  Puede  ducharse.  Puede retomar las actividades habituales, pero hgalo ms despacio durante las primeras 24horas.  Durante las primeras 24horas, descanse con frecuencia.  Camine o pngase compresas tibias en el vientre (abdomen) si tiene clicos intestinales o gases.  Beba suficiente lquido para mantener el pis (orina) claro o de color amarillo plido.  Retome su dieta normal. No coma comidas pesadas ni fritas.  No tome alcohol durante 24horas, o segn el mdico le indique.  Tome solo los medicamentos segn le haya indicado el mdico. Si se obtuvo una muestra de tejido (biopsia) durante el procedimiento:   No tome aspirina ni anticoagulantes durante 7das, o segn el mdico le indique.  No tome alcohol durante 7das, o segn el mdico le indique.  Consuma alimentos livianos durante las primeras 24horas. SOLICITE AYUDA SI: An hay una pequea cantidad de sangre en la materia fecal (heces) 2 o 3das despus del procedimiento. SOLICITE AYUDA DE INMEDIATO SI:  Hay ms que una pequea cantidad de [employer] materia fecal.  Observa grumos de tejido (cogulos de [place]) en la materia fecal.  Tiene el vientre inflamado (hinchado).  Tiene malestar estomacal (nuseas) o vomita.  Tiene fiebre.  Siente que Conservation officer, historic buildings en el vientre empeora y no se alivia con los medicamentos.   Dieta con alto contenido de fibra  (High [employer] Health Diet)  La fibra se encuentra en frutas, verduras y granos. Una dieta con alto contenido en fibras se favorece con la adicin de ms granos enteros, legumbres, frutas y verduras en su dieta. La cantidad recomendada de fibra para los hombres adultos es de 38 g por da. Para las mujeres adultas es de 25 g  por Training and development officer. Las [employer] y las que amamantan deben consumir 29 gramos de fibra por Training and development officer. Si usted tiene un problema digestivo o intestinal, consulte a su mdico antes de la adicin de alimentos ricos en fibra a su dieta. Coma una variedad de alimentos ricos en  fibra en lugar de slo unos pocos.  OBJETIVO  Aumentar la masa fecal.  Tener deposiciones ms regulares para evitar el estreimiento.  Reducir el colesterol.  Para evitar comer en exceso. [place]?  En caso de estreimiento y hemorroides.  En caso de diverticulosis no complicada (enfermedad intestinal) y en el sndrome del colon irritable.  Si necesita ayuda para el control de [place].  Si desea mejorar su dieta como medida de proteccin contra la aterosclerosis, la diabetes y Science writer. [place] y cereales integrales.  Frutas, como las [place], [place], pltanos, fresas, Development worker, community y peras.  Verduras, como guisantes, zanahorias, batatas, remolachas, brcoli, repollo, espinacas y alcauciles.  Legumbres, las arvejas, soja, lentejas.  Almendras. CONTENIDO DE FIBRA DE LOS ALIMENTOS  Almidones y granos / Heritage manager (g)  Cheerios, 1 taza / 3 g  Corn Flakes, 1 taza / 0,7 g  Arroz inflado, 1  tazas / 0,3 g  Harina de avena instantnea (cocida),  taza / 2 g  Cereal de trigo escarchado, 1 taza / 5,1 g  Arroz marrn grano largo (cocido), 1 taza / 3,5 g  Arroz blanco grano largo (cocido), 1 taza / 0,6 g  Macarrones enriquecidos (cocidos), 1 taza / 2,5 g Legumbres / Fibra Diettica (g)  Frijoles cocidos (enlatados, crudos o vegetarianos),  taza / 5,2 g  Frijoles (enlatados),  taza / 6,8 g  Frijoles pintos (cocidos),  taza / 5,5 g Panes y Administrator / Heritage manager (g)  Galletas de graham o miel, 2 plazas / 0,7 g  Galletitas saladas, 3 unidades / 0,3 g  Pretzels salados comunes, 10 pedazos / 1,8 g  Pan integral, 1 rebanada / 1,9 g  Pan blanco, 1 rebanada / 0,7 g  Pan con pasas, 1 rebanada / 1,2 g  Bagel 3 oz / 2 g  Tortilla de harina, 1 oz / 0.9 g  Tortilla de maz, 1 pequea / 1,5 g  Pan de [place] o hot dog, 1 pequeo / 0,9 g Frutas / Fibra Diettica (g)  Manzana con piel, 1 mediana / 4,4 g  Pur de [employer],  taza / 1,5 g    [place],  mediano / 1,5 g  Uvas, 10 uvas / 0,4 g  Naranja, 1 pequea / 2,3 g  Pasas, 1,5 oz / 1.6 g  Meln, 1 taza / 1,4 g Vegetales / Fibra Diettica (g)  Judas verdes (en conserva),  taza / 1,3 g  Zanahorias (cocido),  taza / 2,3 g  Broccoli (cocido),  taza / 2,8 g  Guisantes (cocidos),  taza / 4,4 g  Pur de papas,  taza / 1,6 g  Lechuga, 1 taza / 0,5 g  Maz (en lata),  taza / 1,6 g  Tomate,  taza / 1,1 g 1 cup / 3 g.

## 2020-03-31 ENCOUNTER — Telehealth: Payer: Self-pay | Admitting: *Deleted

## 2020-03-31 NOTE — Telephone Encounter (Signed)
Received call from pt's family member stating that her RX for Linzess had not been seen to [employer].  Called [facility] and they did have RX but no insurance on file for pt.  The cost without insurance would be over $1000.  Called pt back and pt put another lady on the phone and told me to talk to her.  Informed them of the information given by the pharmacy.  Pt does not have insurance.  Informed them that I would send information to [facility] for her to see if she can get patient assistance for Linzess.

## 2020-04-03 NOTE — Telephone Encounter (Signed)
Faxed pt assistance forms and RX to Mary Sella at [facility].

## 2020-04-04 LAB — SURGICAL PATHOLOGY

## 2020-04-06 ENCOUNTER — Telehealth: Payer: Self-pay | Admitting: Emergency Medicine

## 2020-04-06 ENCOUNTER — Encounter (HOSPITAL_COMMUNITY): Payer: Self-pay

## 2020-04-06 ENCOUNTER — Emergency Department (HOSPITAL_COMMUNITY)
Admission: EM | Admit: 2020-04-06 | Discharge: 2020-04-06 | Disposition: A | Discharge status: Home / Self Care | Payer: Self-pay | Attending: Emergency Medicine | Admitting: Emergency Medicine

## 2020-04-06 ENCOUNTER — Other Ambulatory Visit: Payer: Self-pay

## 2020-04-06 ENCOUNTER — Emergency Department (HOSPITAL_COMMUNITY): Payer: Self-pay

## 2020-04-06 DIAGNOSIS — R1084 Generalized abdominal pain: Secondary | ICD-10-CM | POA: Insufficient documentation

## 2020-04-06 DIAGNOSIS — I10 Essential (primary) hypertension: Secondary | ICD-10-CM | POA: Insufficient documentation

## 2020-04-06 DIAGNOSIS — Z79899 Other long term (current) drug therapy: Secondary | ICD-10-CM | POA: Insufficient documentation

## 2020-04-06 LAB — COMPREHENSIVE METABOLIC PANEL
ALT: 16 U/L (ref 0–44)
AST: 18 U/L (ref 15–41)
Albumin: 4.1 g/dL (ref 3.5–5.0)
Alkaline Phosphatase: 91 U/L (ref 38–126)
Anion gap: 11 (ref 5–15)
BUN: 10 mg/dL (ref 6–20)
CO2: 25 mmol/L (ref 22–32)
Calcium: 8.7 mg/dL — ABNORMAL LOW (ref 8.9–10.3)
Chloride: 101 mmol/L (ref 98–111)
Creatinine, Ser: 0.51 mg/dL (ref 0.44–1.00)
GFR calc Af Amer: >60 mL/min (ref >60)
GFR calc non Af Amer: >60 mL/min (ref >60)
Glucose, Bld: 96 mg/dL (ref 70–99)
Potassium: 2.8 mmol/L — ABNORMAL LOW (ref 3.5–5.1)
Sodium: 137 mmol/L (ref 135–145)
Total Bilirubin: 1.1 mg/dL (ref 0.3–1.2)
Total Protein: 7.2 g/dL (ref 6.5–8.1)

## 2020-04-06 LAB — CBC
HCT: 40.1 % (ref 36.0–46.0)
Hemoglobin: 13.8 g/dL (ref 12.0–15.0)
MCH: 30.5 pg (ref 26.0–34.0)
MCHC: 34.4 g/dL (ref 30.0–36.0)
MCV: 88.5 fL (ref 80.0–100.0)
Platelets: 262 10*3/uL (ref 150–400)
RBC: 4.53 MIL/uL (ref 3.87–5.11)
RDW: 13.3 % (ref 11.5–15.5)
WBC: 5.7 10*3/uL (ref 4.0–10.5)
nRBC: 0 % (ref 0.0–0.2)

## 2020-04-06 LAB — LIPASE, BLOOD: Lipase: 30 U/L (ref 11–51)

## 2020-04-06 LAB — URINALYSIS, ROUTINE W REFLEX MICROSCOPIC
Bilirubin Urine: NEGATIVE
Glucose, UA: NEGATIVE mg/dL
Ketones, ur: NEGATIVE mg/dL
Nitrite: NEGATIVE
Protein, ur: NEGATIVE mg/dL
Specific Gravity, Urine: 1.009 (ref 1.005–1.030)
pH: 7 (ref 5.0–8.0)

## 2020-04-06 LAB — POC OCCULT BLOOD, ED: Fecal Occult Bld: NEGATIVE

## 2020-04-06 LAB — POC URINE PREG, ED: Preg Test, Ur: NEGATIVE

## 2020-04-06 MED ORDER — POTASSIUM CHLORIDE 10 MEQ/100ML IV SOLN
10.0000 meq | Freq: Once | INTRAVENOUS | Status: AC
  Administered 2020-04-06: 14:00:00 10 meq via INTRAVENOUS
  Filled 2020-04-06: qty 100

## 2020-04-06 MED ORDER — SODIUM CHLORIDE 0.9% FLUSH: 3.0000 mL | Freq: Once | INTRAVENOUS | Status: DC

## 2020-04-06 MED ORDER — DICYCLOMINE HCL 20 MG PO TABS: 20.0000 mg | ORAL_TABLET | Freq: Two times a day (BID) | ORAL | 0 refills | Status: DC

## 2020-04-06 MED ORDER — MORPHINE SULFATE (PF) 4 MG/ML IV SOLN
4.0000 mg | Freq: Once | INTRAVENOUS | Status: AC
  Administered 2020-04-06: 14:00:00 4 mg via INTRAVENOUS
  Filled 2020-04-06: qty 1

## 2020-04-06 MED ORDER — IOHEXOL 300 MG/ML  SOLN
100.0000 mL | Freq: Once | INTRAMUSCULAR | Status: AC | PRN
  Administered 2020-04-06: 100 mL via INTRAVENOUS

## 2020-04-06 NOTE — Telephone Encounter (Signed)
PLEASE CALL PT'S DAUGHTER. She should go to the nearest ED for an evaluation.

## 2020-04-06 NOTE — ED Triage Notes (Signed)
Ipad interpreter used.  Pt had colonoscopy recently and since then she has had severe lower abd pain, nausea, and black stools.  Reports nausea but no vomiting.  LBM was last night.   Family says pt called Dr. Oneida Alar and was told to come to er for evaluation.

## 2020-04-06 NOTE — ED Notes (Signed)
Went over discharge papers and pt verbalized understanding

## 2020-04-06 NOTE — Telephone Encounter (Signed)
Pt daughter alexandra called in and stated pt is having abd pain,  rectal bleeding, diarrhea, chills but does not have fever, pt states her last bm was last night and it was black. Pt daughter is rotating aleve and Tylenol every 8 hours as needed for pain  pt is also complaining about severe pain in her right leg is a 10.    since she had her procedure on 03/30/20. But the pain has increased on 4/11  Mother speaks spanish so daughter so that is why her daughter called for her. Daughters contact number is [phone]

## 2020-04-06 NOTE — ED Provider Notes (Signed)
[facility] Provider Note   CSN: [id number] Arrival date & time: 04/06/20  1112     History Chief Complaint  Patient presents with  . Abdominal Pain    Sylvia Nguyen is a 49 y.o. female with history of hypertension who presents with abdominal pain.  Patient is accompanied by her daughter who helps interpret.  She states that she had a colonoscopy on April 8 by Dr. Oneida Alar due to an abnormal CT scan and colon cancer screening.  Rectal polyps were noted and biopsied and she had internal and external hemorrhoids but otherwise colonoscopy was unremarkable.  Over the weekend she felt okay however on Monday she started to have lower abdominal pain which starts in the left lower quadrant and radiates to the right side.  The pain is become gradually worse and now radiates throughout her whole abdomen into her back and to her legs.  The pain is worse when she tries to eat.  She reports associated black loose stools and nausea as well as chills.  She's also been having some vaginal spotting. She is not post-menopausal. She has been taking Tylenol and Aleve for pain without significant relief. She denies fever, chest pain, shortness of breath, diarrhea, constipation, urinary symptoms.  They contacted Dr. Oneida Alar office and was advised to come to the ED.  HPI     Past Medical History:  Diagnosis Date  . Hypertension   . Uterine fibroid     Patient Active Problem List   Diagnosis Date Noted  . Polyp of rectum   . Abdominal pain, left lower quadrant 10/18/2019  . Abnormal CT scan, colon 10/18/2019  . Constipation 10/18/2019    Past Surgical History:  Procedure Laterality Date  . COLONOSCOPY N/A 03/30/2020   Procedure: COLONOSCOPY;  Surgeon: Danie Binder, MD;  [facility]: [facility];  Service: Endoscopy;  Laterality: N/A;  10:45am  . POLYPECTOMY  03/30/2020   Procedure: POLYPECTOMY;  Surgeon: Danie Binder, MD;  Location: [facility];  Service: Endoscopy;;  rectal      OB History   No obstetric history on file.     Family History  Problem Relation Age of Onset  . Other Father        MVA  . Colon cancer Neg Hx     Social History   Tobacco Use  . Smoking status: Never Smoker  . Smokeless tobacco: Never Used  Substance Use Topics  . Alcohol use: No  . Drug use: No    Home Medications Prior to Admission medications   Medication Sig Start Date End Date Taking? Authorizing Provider  acetaminophen (TYLENOL) 500 MG tablet Take 500 mg by mouth every 6 (six) hours as needed.    [provider]  albuterol (VENTOLIN HFA) 108 (90 Base) MCG/ACT inhaler Inhale 1 puff into the lungs every 6 (six) hours as needed for wheezing or shortness of breath. 12/03/19   Tacy Learn, PA-C  amLODipine (NORVASC) 10 MG tablet Take 10 mg by mouth daily.    [provider]  linaclotide Rolan Lipa) 72 MCG capsule 1 po with breakfast 03/30/20   Fields, Sandi L, MD  temazepam (RESTORIL) 30 MG capsule Take 1 capsule by mouth at bedtime as needed. 03/13/20   [provider]    Allergies    Patient has no known allergies.  Review of Systems   Review of Systems  Constitutional: Positive for chills. Negative for fever.  Respiratory: Negative for shortness of breath.  Cardiovascular: Negative for chest pain.  Gastrointestinal: Positive for abdominal pain and nausea. Negative for constipation, diarrhea and vomiting.       +black stools  Genitourinary: Negative for dysuria and flank pain.  All other systems reviewed and are negative.   Physical Exam Updated Vital Signs BP 133/89 (BP Location: Right Arm)   Pulse 69   Temp 98.6 F (37 C) (Oral)   Resp 18   Ht 5' (1.524 m)   Wt 59.9 kg   BMI 25.78 kg/m   Physical Exam Vitals and nursing note reviewed.  Constitutional:      General: She is not in acute distress.    Appearance: Normal appearance. She is well-developed. She is not ill-appearing.  HENT:     Head: Normocephalic and  atraumatic.  Eyes:     General: No scleral icterus.       Right eye: No discharge.        Left eye: No discharge.     Conjunctiva/sclera: Conjunctivae normal.     Pupils: Pupils are equal, round, and reactive to light.  Cardiovascular:     Rate and Rhythm: Normal rate and regular rhythm.  Pulmonary:     Effort: Pulmonary effort is normal. No respiratory distress.     Breath sounds: Normal breath sounds.  Abdominal:     General: There is no distension.     Palpations: Abdomen is soft.     Tenderness: There is abdominal tenderness (lower abdominal tenderness).  Genitourinary:    Comments: Rectal: Small external non-thrombosed hemorrhoid. No gross blood or melena noted. No tenderness  Of note pt is wearing a pad and there is bright red blood which she states is from the vagina Musculoskeletal:     Cervical back: Normal range of motion.  Skin:    General: Skin is warm and dry.  Neurological:     Mental Status: She is alert and oriented to person, place, and time.  Psychiatric:        Behavior: Behavior normal.     ED Results / Procedures / Treatments   Labs (all labs ordered are listed, but only abnormal results are displayed) Labs Reviewed  COMPREHENSIVE METABOLIC PANEL - Abnormal; Notable for the following components:      Result Value   Potassium 2.8 (*)    Calcium 8.7 (*)    All other components within normal limits  URINALYSIS, ROUTINE W REFLEX MICROSCOPIC - Abnormal; Notable for the following components:   APPearance HAZY (*)    Hgb urine dipstick MODERATE (*)    Leukocytes,Ua TRACE (*)    Bacteria, UA RARE (*)    All other components within normal limits  LIPASE, BLOOD  CBC  POC URINE PREG, ED  POC OCCULT BLOOD, ED    EKG None  Radiology CT Abdomen Pelvis W Contrast  Result Date: 04/06/2020 CLINICAL DATA:  Generalized abdominal pain, history of recent colonoscopy EXAM: CT ABDOMEN AND PELVIS WITH CONTRAST TECHNIQUE: Multidetector CT imaging of the abdomen  and pelvis was performed using the standard protocol following bolus administration of intravenous contrast. CONTRAST:  149mL OMNIPAQUE IOHEXOL 300 MG/ML  SOLN COMPARISON:  07/11/2019 FINDINGS: Lower chest: No acute abnormality. Hepatobiliary: Mild fatty infiltration of the liver is noted. The gallbladder is well distended. No biliary ductal dilatation is seen. Pancreas: Unremarkable. No pancreatic ductal dilatation or surrounding inflammatory changes. Spleen: Normal in size without focal abnormality. Adrenals/Urinary Tract: Adrenal glands are within normal limits bilaterally. Kidneys demonstrate a normal enhancement pattern bilaterally. Tiny  nonobstructing stone is noted in the lower pole of the right kidney. Delayed images demonstrate normal excretion of contrast material. No obstructive changes are seen. The ureters are within normal limits bilaterally. The bladder is well distended. Stomach/Bowel: Minimal diverticular change of the colon is noted. No findings to suggest diverticulitis are seen. The appendix is well visualized and within normal limits. No small bowel or gastric abnormality is noted. Vascular/Lymphatic: No significant vascular findings are present. No enlarged abdominal or pelvic lymph nodes. Reproductive: Uterus is within normal limits. Small right ovarian cyst is noted measuring 2.2 cm. Other: No abdominal wall hernia or abnormality. No abdominopelvic ascites. Musculoskeletal: Mild degenerative changes of lumbar spine are noted. IMPRESSION: Tiny nonobstructing right renal stone. Minimal diverticular change of the colon. Small right ovarian cyst. Electronically Signed   By: Inez Catalina M.D.   On: 04/06/2020 15:51    Procedures Procedures (including critical care time)  Medications Ordered in ED Medications  sodium chloride flush (NS) 0.9 % injection 3 mL (has no administration in time range)  potassium chloride 10 mEq in 100 mL IVPB (0 mEq Intravenous Stopped 04/06/20 1543)  morphine 4  MG/ML injection 4 mg (4 mg Intravenous Given 04/06/20 1348)  iohexol (OMNIPAQUE) 300 MG/ML solution 100 mL (100 mLs Intravenous Contrast Given 04/06/20 1514)    ED Course  I have reviewed the triage vital signs and the nursing notes.  Pertinent labs & imaging results that were available during my care of the patient were reviewed by me and considered in my medical decision making (see chart for details).  49 year old female presents with diffuse lower abdominal pain that radiates around to her back and to her legs.  Her vital signs are normal.  Abdomen is soft and minimally tender across the lower abdomen.  Recently had a colonoscopy and was sent for evaluation by GI.  Will obtain labs, urine, CT abdomen and pelvis.  Will provide pain control.  CT scan is negative for any acute abnormality.  Lab work is overall reassuring.  Urine has hemoglobin but she is having vaginal spotting right now.  Hemoccult was checked and is negative.  Patient provided reassurance.  Symptoms could be possibly coming from starting [place].  Advised to stop this medicine and try Bentyl.   She is also encouraged to follow-up with GI.  Dr. Oneida Alar was consulted and notified of patient's ED stay findings per family request.  MDM Rules/Calculators/A&P                      Final Clinical Impression(s) / ED Diagnoses Final diagnoses:  Generalized abdominal pain    Rx / DC Orders ED Discharge Orders    None       Recardo Evangelist, PA-C 04/06/20 [place], [facility], MD 04/07/20 [phone]

## 2020-04-06 NOTE — Telephone Encounter (Signed)
Pt daughter stated she is taking her mother to [facility] ER as soon as she takes a shower and thanked me for the call

## 2020-04-06 NOTE — Discharge Instructions (Signed)
Please stop the Linzess Start Bentyl twice a day for pain Please follow up with Dr. Oneida Alar

## 2020-04-06 NOTE — ED Notes (Signed)
Pt gone to CT 

## 2020-04-11 ENCOUNTER — Telehealth: Payer: Self-pay | Admitting: Gastroenterology

## 2020-04-11 NOTE — Telephone Encounter (Addendum)
Please call pt. She had a polypoid lesion, removed and it was benign. THE ABDOMINAL PAIN IS DUE TO CONSTIPATION.  DRINK WATER TO KEEP YOUR URINE LIGHT YELLOW. FOLLOW A HIGH FIBER DIET. AVOID ITEMS THAT CAUSE BLOATING. . TO REDUCE CONSTIPATION,CONTINUE LINZESS 30 MINS PRIOR TO BREAKFAST. YOU SHOULD HAVE A BM 1-3 HRS AFTER THE DOSE IF NOT TAKE THE LINZESS WITH BREAKFAST. IT CAN CAUSE EXPLOSIVE DIARRHEA. USE PREPARATION H FOUR TIMES  A DAY IF NEEDED TO RELIEVE RECTAL PAIN/PRESSURE/BLEEDING. PLEASE CALL WITH QUESTIONS OR CONCERNS. FOLLOW UP IN 4 MOS.  Next colonoscopy in 10 years.

## 2020-04-12 NOTE — Telephone Encounter (Signed)
Called lmom

## 2020-04-12 NOTE — Telephone Encounter (Signed)
Scheduled and on recall °

## 2020-04-13 NOTE — Telephone Encounter (Signed)
Called son  Lennette Bihari and notified him of results. Lennette Bihari stated he understood and that he would let his his mother know. Thanked me for the call. Agreeable to colonoscopy in 10 years. F/u appt on 8/11 witl ll

## 2020-04-24 ENCOUNTER — Telehealth: Payer: Self-pay | Admitting: Emergency Medicine

## 2020-04-24 NOTE — Telephone Encounter (Signed)
PtS daughter called and stated mother has been having vaginal bleeding since the procedure. She denies any fever or vomiting but states she is constipated from taking pain medication that her pcp wrote for her. Pt stated she did have a normal bm today but she wanted to make an appt to been seen here in the office. the soonest we have is on 5/14 with kh. Does pt need to go to ER?

## 2020-04-24 NOTE — Telephone Encounter (Signed)
Notified daughter to follow up with pcp about vaginal bleeding, pt daughter stated she understood and she would have pt f/u with pcp

## 2020-04-24 NOTE — Telephone Encounter (Signed)
Pt should contact her PCP if she is having vaginal bleeding.

## 2020-06-28 ENCOUNTER — Encounter: Payer: Self-pay | Admitting: Gastroenterology

## 2020-07-04 ENCOUNTER — Ambulatory Visit: Payer: Self-pay | Attending: Oncology

## 2020-08-07 ENCOUNTER — Ambulatory Visit: Payer: Self-pay | Admitting: Gastroenterology

## 2020-08-15 ENCOUNTER — Ambulatory Visit: Payer: Self-pay | Attending: Oncology

## 2020-08-15 ENCOUNTER — Encounter (INDEPENDENT_AMBULATORY_CARE_PROVIDER_SITE_OTHER): Payer: Self-pay

## 2020-08-15 ENCOUNTER — Other Ambulatory Visit: Payer: Self-pay

## 2020-08-15 ENCOUNTER — Ambulatory Visit
Admission: RE | Admit: 2020-08-15 | Discharge: 2020-08-15 | Disposition: A | Discharge status: Home / Self Care | Payer: Self-pay | Source: Ambulatory Visit | Attending: Oncology | Admitting: Oncology

## 2020-08-15 VITALS — BP 151/92 | HR 72 | Temp 98.2°F | Ht 62.0 in | Wt 127.4 lb

## 2020-08-15 DIAGNOSIS — Z0000 Encounter for general adult medical examination without abnormal findings: Secondary | ICD-10-CM | POA: Insufficient documentation

## 2020-08-15 NOTE — Progress Notes (Signed)
  Subjective:     Patient ID: Sylvia Nguyen, female   DOB: [birth year], 49 y.o.   MRN: [id number]  HPI   Review of Systems     Objective:   Physical Exam Chest:     Breasts:        Right: No swelling, bleeding, inverted nipple, mass, nipple discharge, skin change or tenderness.        Left: No swelling, bleeding, inverted nipple, mass, nipple discharge, skin change or tenderness.        Assessment:     49 year old Hispanic patient patient presents for [facility] visit.  Patient screened, and meets BCCCP eligibility.  Patient does not have insurance, Medicare or Medicaid. Instructed patient on breast self awareness using teach back method.  Clinical breast exam unremarkable.  No mass or lump palpated.   Patient is on menstrual cycle.  Reports she had a normal pap at [facility] last month.  Sylvia Nguyen interpreted exam.   Risk Assessment    Risk Scores      08/15/2020   Last edited by: Rico Junker, RN   5-year risk: 0.7 %   Lifetime risk: 6.1 %            Plan:     Sent for bilateral screening mammogram.

## 2020-08-15 NOTE — Progress Notes (Signed)
ms 

## 2020-08-16 NOTE — Progress Notes (Signed)
Letter mailed from [facility] to notify of normal mammogram results.  Patient to return in one year for annual screening.  Copy to [facility]. 

## 2020-08-30 ENCOUNTER — Encounter: Payer: Self-pay | Admitting: Internal Medicine

## 2020-08-30 ENCOUNTER — Ambulatory Visit: Payer: Self-pay | Admitting: Gastroenterology

## 2020-10-23 ENCOUNTER — Encounter (HOSPITAL_BASED_OUTPATIENT_CLINIC_OR_DEPARTMENT_OTHER): Payer: Self-pay | Admitting: *Deleted

## 2020-10-23 ENCOUNTER — Other Ambulatory Visit: Payer: Self-pay

## 2020-10-23 DIAGNOSIS — I10 Essential (primary) hypertension: Secondary | ICD-10-CM | POA: Insufficient documentation

## 2020-10-23 DIAGNOSIS — Z79899 Other long term (current) drug therapy: Secondary | ICD-10-CM | POA: Insufficient documentation

## 2020-10-23 DIAGNOSIS — N201 Calculus of ureter: Secondary | ICD-10-CM | POA: Insufficient documentation

## 2020-10-23 LAB — URINALYSIS, ROUTINE W REFLEX MICROSCOPIC
Bilirubin Urine: NEGATIVE
Glucose, UA: NEGATIVE mg/dL
Ketones, ur: NEGATIVE mg/dL
Nitrite: NEGATIVE
Protein, ur: NEGATIVE mg/dL
Specific Gravity, Urine: 1.015 (ref 1.005–1.030)
pH: 7.5 (ref 5.0–8.0)

## 2020-10-23 LAB — URINALYSIS, MICROSCOPIC (REFLEX)

## 2020-10-23 NOTE — ED Triage Notes (Signed)
Back pain with radiation into her right leg x 4 days. Urge to urinate.

## 2020-10-24 ENCOUNTER — Emergency Department (HOSPITAL_BASED_OUTPATIENT_CLINIC_OR_DEPARTMENT_OTHER)
Admission: EM | Admit: 2020-10-24 | Discharge: 2020-10-24 | Disposition: A | Discharge status: Home / Self Care | Payer: Self-pay | Attending: Emergency Medicine | Admitting: Emergency Medicine

## 2020-10-24 ENCOUNTER — Emergency Department (HOSPITAL_BASED_OUTPATIENT_CLINIC_OR_DEPARTMENT_OTHER): Payer: Self-pay

## 2020-10-24 DIAGNOSIS — N201 Calculus of ureter: Secondary | ICD-10-CM

## 2020-10-24 MED ORDER — ONDANSETRON 8 MG PO TBDP: 8.0000 mg | ORAL_TABLET | Freq: Three times a day (TID) | ORAL | 0 refills | Status: DC | PRN

## 2020-10-24 MED ORDER — OXYCODONE-ACETAMINOPHEN 5-325 MG PO TABS
1.0000 | ORAL_TABLET | Freq: Once | ORAL | Status: AC
  Administered 2020-10-24: 1 via ORAL
  Filled 2020-10-24: qty 1

## 2020-10-24 MED ORDER — ONDANSETRON 4 MG PO TBDP
8.0000 mg | ORAL_TABLET | Freq: Once | ORAL | Status: AC
  Administered 2020-10-24: 8 mg via ORAL
  Filled 2020-10-24: qty 2

## 2020-10-24 MED ORDER — TAMSULOSIN HCL 0.4 MG PO CAPS: ORAL_CAPSULE | ORAL | 0 refills | Status: DC

## 2020-10-24 MED ORDER — TAMSULOSIN HCL 0.4 MG PO CAPS
0.4000 mg | ORAL_CAPSULE | Freq: Once | ORAL | Status: AC
  Administered 2020-10-24: 0.4 mg via ORAL
  Filled 2020-10-24: qty 1

## 2020-10-24 MED ORDER — OXYCODONE-ACETAMINOPHEN 5-325 MG PO TABS: 1.0000 | ORAL_TABLET | ORAL | 0 refills | Status: DC | PRN

## 2020-10-24 NOTE — ED Provider Notes (Signed)
[facility] Provider Note: Georgena Spurling, MD, FACEP  CSN: [id number] MRN: [id number] ARRIVAL: 10/23/20 at 2207 ROOM: MH12/MH12   CHIEF COMPLAINT  Flank Pain   HISTORY OF PRESENT ILLNESS  10/24/20 12:24 AM Sylvia Nguyen is a 49 y.o. female with a 1 week history of right flank pain radiating to her right lower quadrant.  She rates the pain as an 8 out of 10.  The pain is not worse with movement.  She has had nausea but no vomiting.  She is not aware of hematuria but has felt urinary urgency.  She has not had a fever.   Past Medical History:  Diagnosis Date  . Hypertension   . Uterine fibroid     Past Surgical History:  Procedure Laterality Date  . COLONOSCOPY N/A 03/30/2020   Procedure: COLONOSCOPY;  Surgeon: Danie Binder, MD;  [facility];  Service: Endoscopy;  Laterality: N/A;  10:45am  . POLYPECTOMY  03/30/2020   Procedure: POLYPECTOMY;  Surgeon: Danie Binder, MD;  [facility]: [facility];  Service: Endoscopy;;  rectal    Family History  Problem Relation Age of Onset  . Other Father        MVA  . Colon cancer Neg Hx     Social History   Tobacco Use  . Smoking status: Never Smoker  . Smokeless tobacco: Never Used  Vaping Use  . Vaping Use: Never used  Substance Use Topics  . Alcohol use: No  . Drug use: No    Prior to Admission medications   Medication Sig Start Date End Date Taking? Authorizing Provider  albuterol (VENTOLIN HFA) 108 (90 Base) MCG/ACT inhaler Inhale 1 puff into the lungs every 6 (six) hours as needed for wheezing or shortness of breath. 12/03/19   Tacy Learn, PA-C  amLODipine (NORVASC) 10 MG tablet Take 10 mg by mouth daily.    [provider]  dicyclomine (BENTYL) 20 MG tablet Take 1 tablet (20 mg total) by mouth 2 (two) times daily. 04/06/20   Recardo Evangelist, PA-C  linaclotide Rolan Lipa) 72 MCG capsule 1 po with breakfast 03/30/20   Fields, Marga Melnick, MD  ondansetron (ZOFRAN ODT) 8 MG disintegrating  tablet Take 1 tablet (8 mg total) by mouth every 8 (eight) hours as needed for nausea or vomiting. 10/24/20   Arasely Akkerman, MD  oxyCODONE-acetaminophen (PERCOCET) 5-325 MG tablet Take 1 tablet by mouth every 4 (four) hours as needed (for pain; may cause constipation). 10/24/20   Herny Scurlock, MD  tamsulosin (FLOMAX) 0.4 MG CAPS capsule Take 1 capsule daily until stone passes. 10/24/20   Dakin Madani, MD  temazepam (RESTORIL) 30 MG capsule Take 1 capsule by mouth at bedtime as needed. 03/13/20   [provider]    Allergies Patient has no known allergies.   REVIEW OF SYSTEMS  Negative except as noted here or in the History of Present Illness.   PHYSICAL EXAMINATION  Initial Vital Signs Blood pressure (!) 147/81, pulse 96, temperature 98.4 F (36.9 C), temperature source Oral, resp. rate 18, height 5\' 2"  (1.575 m), weight 57.8 kg, SpO2 100 %.  Examination General: Well-developed, well-nourished female in no acute distress; appearance consistent with age of record HENT: normocephalic; atraumatic Eyes: Normal appearance Neck: supple Heart: regular rate and rhythm Lungs: clear to auscultation bilaterally Abdomen: soft; nondistended; equivocal right lower quadrant tenderness; bowel sounds present GU: Equivocal right CVA tenderness Extremities: No deformity; full range of motion; pulses normal Neurologic: Awake,  alert; motor function intact in all extremities and symmetric; no facial droop Skin: Warm and dry Psychiatric: Normal mood and affect   RESULTS  Summary of this visit's results, reviewed and interpreted by myself:   EKG Interpretation  Date/Time:    Ventricular Rate:    PR Interval:    QRS Duration:   QT Interval:    QTC Calculation:   R Axis:     Text Interpretation:        Laboratory Studies: Results for orders placed or performed during the hospital encounter of 10/24/20 (from the past 24 hour(s))  Urinalysis, Routine w reflex microscopic Urine, Clean  Catch     Status: Abnormal   Collection Time: 10/23/20 10:29 PM  Result Value Ref Range   Color, Urine YELLOW YELLOW   APPearance CLEAR CLEAR   Specific Gravity, Urine 1.015 1.005 - 1.030   pH 7.5 5.0 - 8.0   Glucose, UA NEGATIVE NEGATIVE mg/dL   Hgb urine dipstick SMALL (A) NEGATIVE   Bilirubin Urine NEGATIVE NEGATIVE   Ketones, ur NEGATIVE NEGATIVE mg/dL   Protein, ur NEGATIVE NEGATIVE mg/dL   Nitrite NEGATIVE NEGATIVE   Leukocytes,Ua TRACE (A) NEGATIVE  Urinalysis, Microscopic (reflex)     Status: Abnormal   Collection Time: 10/23/20 10:29 PM  Result Value Ref Range   RBC / HPF 11-20 0 - 5 RBC/hpf   WBC, UA 0-5 0 - 5 WBC/hpf   Bacteria, UA RARE (A) NONE SEEN   Squamous Epithelial / LPF 0-5 0 - 5   Imaging Studies: CT Renal Stone Study  Result Date: 10/24/2020 CLINICAL DATA:  Back pain, flank pain, nephrolithiasis EXAM: CT ABDOMEN AND PELVIS WITHOUT CONTRAST TECHNIQUE: Multidetector CT imaging of the abdomen and pelvis was performed following the standard protocol without IV contrast. COMPARISON:  04/06/2020 FINDINGS: Lower chest: The visualized lung bases are clear bilaterally. The visualized heart and pericardium are unremarkable peer Hepatobiliary: No focal liver abnormality is seen. No gallstones, gallbladder wall thickening, or biliary dilatation. Pancreas: Unremarkable Spleen: Unremarkable Adrenals/Urinary Tract: The adrenal glands are unremarkable. The kidneys are normal in size and position. There is moderate right hydronephrosis and hydroureter secondary to an obstructing 3 mm x 4 mm x 5 mm calculus at the right ureterovesicular junction. There are at least 4 nonobstructing renal calculi within the right kidney measuring up to 3 mm. No nephro or urolithiasis on the left. No hydronephrosis on the left. The bladder is unremarkable. Stomach/Bowel: Stomach is within normal limits. Appendix appears normal. No evidence of bowel wall thickening, distention, or inflammatory changes. No  free intraperitoneal gas or fluid. Tiny fat containing umbilical hernia. Vascular/Lymphatic: Minimal atherosclerotic calcification within the a left common iliac artery. No aortic aneurysm. No pathologic adenopathy within the abdomen and pelvis. Reproductive: Uterus and bilateral adnexa are unremarkable. Other: Rectum unremarkable Musculoskeletal: No acute or significant osseous findings. IMPRESSION: Moderate right hydronephrosis and hydroureter secondary to an obstructing 3 mm x 4 mm x 5 mm calculus at the right ureterovesicular junction. Superimposed mild right nonobstructing nephrolithiasis. Aortic Atherosclerosis (ICD10-I70.0). Electronically Signed   By: Fidela Salisbury MD   On: 10/24/2020 01:13    ED COURSE and MDM  Nursing notes, initial and subsequent vitals signs, including pulse oximetry, reviewed and interpreted by myself.  Vitals:   10/23/20 2217 10/24/20 0141  BP: (!) 147/81 124/76  Pulse: 96 89  Resp: 18 16  Temp: 98.4 F (36.9 C)   TempSrc: Oral   SpO2: 100% 100%  Weight: 57.8 kg  Height: 5\' 2"  (1.575 m)    Medications  oxyCODONE-acetaminophen (PERCOCET/ROXICET) 5-325 MG per tablet 1 tablet (1 tablet Oral Given 10/24/20 0140)  ondansetron (ZOFRAN-ODT) disintegrating tablet 8 mg (8 mg Oral Given 10/24/20 0140)  tamsulosin (FLOMAX) capsule 0.4 mg (0.4 mg Oral Given 10/24/20 0139)    Patient and son advised of CT findings consistent with a distal ureteral stone.  PROCEDURES  Procedures   ED DIAGNOSES     ICD-10-CM   1. Ureterolithiasis  N20.1        Marimar Suber, MD 10/24/20 [phone]

## 2020-10-25 ENCOUNTER — Encounter: Payer: Self-pay | Admitting: Gastroenterology

## 2020-10-25 ENCOUNTER — Other Ambulatory Visit: Payer: Self-pay

## 2020-10-25 ENCOUNTER — Other Ambulatory Visit: Payer: Self-pay | Admitting: *Deleted

## 2020-10-25 ENCOUNTER — Ambulatory Visit (INDEPENDENT_AMBULATORY_CARE_PROVIDER_SITE_OTHER): Payer: Self-pay | Admitting: Gastroenterology

## 2020-10-25 ENCOUNTER — Telehealth: Payer: Self-pay

## 2020-10-25 ENCOUNTER — Ambulatory Visit: Payer: Self-pay | Admitting: Gastroenterology

## 2020-10-25 VITALS — BP 132/85 | HR 96 | Temp 97.3°F | Ht 62.0 in | Wt 131.0 lb

## 2020-10-25 DIAGNOSIS — N200 Calculus of kidney: Secondary | ICD-10-CM

## 2020-10-25 DIAGNOSIS — K5900 Constipation, unspecified: Secondary | ICD-10-CM

## 2020-10-25 NOTE — Telephone Encounter (Signed)
Requested pharmacy med list per Neil Crouch, PA

## 2020-10-25 NOTE — Progress Notes (Signed)
Primary Care Physician: Auburn Bilberry, MD  Primary Gastroenterologist:  Elon Alas. Abbey Chatters, DO   Chief Complaint  Patient presents with  . Constipation    HPI: Sylvia Nguyen is a 49 y.o. female here for follow-up.  She has a history of abdominal pain felt to be related to constipation.  In July 2020 she had a CT at that time showing possible mild wall thickening of the descending colon and sigmoid colon versus underdistention.  This was in the setting of crampy left lower quadrant pain.  She was treated for colitis.  Colonoscopy was delayed due to Covid, she tested positive in December 2020.  Completed colonoscopy back in April.  She had a polypoid lesion removed which was benign.  Advised to 10-year follow-up colonoscopy.  After colonoscopy she developed abdominal pain.  She was evaluated in the ED with CT abdomen pelvis with contrast that showed no explanation for her abdominal pain.  Seen in the ED yesterday with complaints of flank pain, back pain.  CT renal stone protocol showed moderate right hydronephrosis and hydroureter secondary to an obstructing 3 x 4 x 5 mm calculus at the right ureterovesicular junction.  Presents with formal interpreter today.  She complains of ongoing flank pain/back pain which radiates down into the pelvic region.  States symptoms are both sides but worse on the right.  Some dysuria.  Has never passed a kidney stone before.  Never seen a urologist.  Was given Flomax yesterday in the ED to try to help her pass the stone.  She was given narcotic but she states she is taking Tylenol.  Describes pain a 7 out of 10 on a pain scale.  She looks comfortable at the moment.  Yesterday had some vomiting but better today.  Abdominal pain slightly better today.  Denies gross hematuria.  States her bowel movements occur every day to every other day.  Feels like they are not adequate.  The medication she is on not helping enough.  Has to take daily.  She is not  sure what she is taking.  It appears that she was not able to afford the Linzess that we gave her last year.  She gets her medications from [facility] in [place] and [place].  We have contacted them for her current medication list. No melena, brbpr.   MEDICATION LIST MAY NOT BE ACCURATE.PATIENT COULD NOT REMEMBER MEDS AND WE HAVE CONTACTED PHARMACY Current Outpatient Medications  Medication Sig Dispense Refill  . albuterol (VENTOLIN HFA) 108 (90 Base) MCG/ACT inhaler Inhale 1 puff into the lungs every 6 (six) hours as needed for wheezing or shortness of breath. 18 g 0  . amLODipine (NORVASC) 10 MG tablet Take 10 mg by mouth daily.    Marland Kitchen dicyclomine (BENTYL) 20 MG tablet Take 1 tablet (20 mg total) by mouth 2 (two) times daily. (Patient taking differently: Take 20 mg by mouth as needed. ) 20 tablet 0  . ondansetron (ZOFRAN ODT) 8 MG disintegrating tablet Take 1 tablet (8 mg total) by mouth every 8 (eight) hours as needed for nausea or vomiting. 20 tablet 0  . oxyCODONE-acetaminophen (PERCOCET) 5-325 MG tablet Take 1 tablet by mouth every 4 (four) hours as needed (for pain; may cause constipation). 20 tablet 0  . Potassium Chloride ER 20 MEQ TBCR Take 1 tablet by mouth daily.    . tamsulosin (FLOMAX) 0.4 MG CAPS capsule Take 1 capsule daily until stone passes. 30 capsule 0  .  COLACE 100 MG capsule Take 100 mg by mouth 2 (two) times daily.    Marland Kitchen omeprazole (PRILOSEC) 20 MG capsule Take 20 mg by mouth every morning.     No current facility-administered medications for this visit.    Allergies as of 10/25/2020  . (No Known Allergies)    ROS:  General: Negative for anorexia, weight loss, fever, chills, fatigue, weakness. ENT: Negative for hoarseness, difficulty swallowing , nasal congestion. CV: Negative for chest pain, angina, palpitations, dyspnea on exertion, peripheral edema.  Respiratory: Negative for dyspnea at rest, dyspnea on exertion, cough, sputum, wheezing.  GI: See history of  present illness. GU:  Negative for  hematuria, urinary incontinence,  nocturnal urination.  See HPI Endo: Negative for unusual weight change.    Physical Examination:   BP 132/85   Pulse 96   Temp (!) 97.3 F (36.3 C) (Temporal)   Ht 5\' 2"  (1.575 m)   Wt 131 lb (59.4 kg)   BMI 23.96 kg/m   General: Well-nourished, well-developed in no acute distress.  Accompanied by formal interpreter Eyes: No icterus. Mouth: masked Lungs: Clear to auscultation bilaterally.  Heart: Regular rate and rhythm, no murmurs rubs or gallops.  Abdomen: Bowel sounds are normal,   nondistended, no hepatosplenomegaly or masses, no abdominal bruits or hernia , no rebound or guarding.  Mild right lower quadrant pain. Extremities: No lower extremity edema. No clubbing or deformities. Neuro: Alert and oriented x 4   Skin: Warm and dry, no jaundice.   Psych: Alert and cooperative, normal mood and affect.  Labs:  Lab Results  Component Value Date   CREATININE 0.51 04/06/2020   BUN 10 04/06/2020   NA 137 04/06/2020   K 2.8 (L) 04/06/2020   CL 101 04/06/2020   CO2 25 04/06/2020   Lab Results  Component Value Date   ALT 16 04/06/2020   AST 18 04/06/2020   ALKPHOS 91 04/06/2020   BILITOT 1.1 04/06/2020   Lab Results  Component Value Date   WBC 5.7 04/06/2020   HGB 13.8 04/06/2020   HCT 40.1 04/06/2020   MCV 88.5 04/06/2020   PLT 262 04/06/2020     Imaging Studies: CT Renal Stone Study  Result Date: 10/24/2020 CLINICAL DATA:  Back pain, flank pain, nephrolithiasis EXAM: CT ABDOMEN AND PELVIS WITHOUT CONTRAST TECHNIQUE: Multidetector CT imaging of the abdomen and pelvis was performed following the standard protocol without IV contrast. COMPARISON:  04/06/2020 FINDINGS: Lower chest: The visualized lung bases are clear bilaterally. The visualized heart and pericardium are unremarkable peer Hepatobiliary: No focal liver abnormality is seen. No gallstones, gallbladder wall thickening, or biliary  dilatation. Pancreas: Unremarkable Spleen: Unremarkable Adrenals/Urinary Tract: The adrenal glands are unremarkable. The kidneys are normal in size and position. There is moderate right hydronephrosis and hydroureter secondary to an obstructing 3 mm x 4 mm x 5 mm calculus at the right ureterovesicular junction. There are at least 4 nonobstructing renal calculi within the right kidney measuring up to 3 mm. No nephro or urolithiasis on the left. No hydronephrosis on the left. The bladder is unremarkable. Stomach/Bowel: Stomach is within normal limits. Appendix appears normal. No evidence of bowel wall thickening, distention, or inflammatory changes. No free intraperitoneal gas or fluid. Tiny fat containing umbilical hernia. Vascular/Lymphatic: Minimal atherosclerotic calcification within the a left common iliac artery. No aortic aneurysm. No pathologic adenopathy within the abdomen and pelvis. Reproductive: Uterus and bilateral adnexa are unremarkable. Other: Rectum unremarkable Musculoskeletal: No acute or significant osseous findings. IMPRESSION: Moderate  right hydronephrosis and hydroureter secondary to an obstructing 3 mm x 4 mm x 5 mm calculus at the right ureterovesicular junction. Superimposed mild right nonobstructing nephrolithiasis. Aortic Atherosclerosis (ICD10-I70.0). Electronically Signed   By: Fidela Salisbury MD   On: 10/24/2020 01:13    Impression/plan:  Pleasant 49 year old Hispanic female, Spanish-speaking only, presenting with interpreter today for further evaluation of abdominal pain, constipation.  Constipation: Patient complains of inadequate bowel movements.  Take something every day to help with bowel movements but she does not recall the name.  We have requested records of recent medications from [facility] in [place] at [facility]  Once reviewed we can make further recommendations.  Right-sided abdominal pain/flank pain/back pain.  CT yesterday with evidence of moderate  right hydronephrosis and hydroureter secondary to obstructing stone at the right ureterovesicular junction measuring 3 x 4 x 5 mm.  Patient with persistent pain today.  Denies persistent vomiting.  Able to hydrate.  Hopefully she will be able to pass the stone.  We will arrange for follow-up with urology.  ER precautions provided.

## 2020-10-25 NOTE — Patient Instructions (Signed)
1. We will try to find out what medications you are on so that we can provide new medication for constipation.  2. Referral to urologist for kidney stone. 3. Go to the ER if you abdominal pain is severe or your develop fever.    Intentaremos averiguar qu medicamentos est tomando para poder proporcionarle nuevos medicamentos para el estreimiento.  Remisin a un urlogo por clculos renales.  Vaya a la sala de emergencias si su dolor abdominal es severo o tiene fiebre.

## 2020-10-26 ENCOUNTER — Telehealth: Payer: Self-pay

## 2020-10-26 NOTE — Telephone Encounter (Signed)
Opened in error

## 2020-10-26 NOTE — Telephone Encounter (Signed)
Received med list from pharmacy. List placed in LSL's box.

## 2020-10-31 MED ORDER — LUBIPROSTONE 24 MCG PO CAPS: 24.0000 ug | ORAL_CAPSULE | Freq: Two times a day (BID) | ORAL | 5 refills | Status: DC

## 2020-10-31 NOTE — Addendum Note (Signed)
Addended by: Mahala Menghini on: 10/31/2020 01:34 PM   Modules accepted: Orders

## 2020-10-31 NOTE — Telephone Encounter (Signed)
Medication list from [facility] reviewed. Medication filled since April 2021 was filled on November 2 (tamsulosin 0.4mg ).  Still need medication list from [facility].   I have sent in RX for Amitiza to [facility] for constipation. Please let pt know.

## 2020-12-05 ENCOUNTER — Ambulatory Visit: Payer: Self-pay | Admitting: Urology

## 2020-12-24 ENCOUNTER — Other Ambulatory Visit: Payer: Self-pay

## 2020-12-24 ENCOUNTER — Encounter (HOSPITAL_COMMUNITY): Payer: Self-pay | Admitting: Emergency Medicine

## 2020-12-24 DIAGNOSIS — U071 COVID-19: Secondary | ICD-10-CM | POA: Diagnosis not present

## 2020-12-24 DIAGNOSIS — S3992XA Unspecified injury of lower back, initial encounter: Secondary | ICD-10-CM | POA: Insufficient documentation

## 2020-12-24 DIAGNOSIS — I10 Essential (primary) hypertension: Secondary | ICD-10-CM | POA: Diagnosis not present

## 2020-12-24 DIAGNOSIS — S4991XA Unspecified injury of right shoulder and upper arm, initial encounter: Secondary | ICD-10-CM | POA: Insufficient documentation

## 2020-12-24 DIAGNOSIS — Y92410 Unspecified street and highway as the place of occurrence of the external cause: Secondary | ICD-10-CM | POA: Diagnosis not present

## 2020-12-24 DIAGNOSIS — R059 Cough, unspecified: Secondary | ICD-10-CM | POA: Diagnosis present

## 2020-12-24 DIAGNOSIS — S0990XA Unspecified injury of head, initial encounter: Secondary | ICD-10-CM | POA: Diagnosis not present
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
## 2020-12-25 ENCOUNTER — Emergency Department (HOSPITAL_COMMUNITY): Payer: No Typology Code available for payment source

## 2020-12-25 ENCOUNTER — Emergency Department (HOSPITAL_COMMUNITY)
Admission: EM | Admit: 2020-12-25 | Discharge: 2020-12-25 | Disposition: A | Discharge status: Home / Self Care | Payer: No Typology Code available for payment source | Attending: Emergency Medicine | Admitting: Emergency Medicine

## 2020-12-25 DIAGNOSIS — S39012A Strain of muscle, fascia and tendon of lower back, initial encounter: Secondary | ICD-10-CM

## 2020-12-25 DIAGNOSIS — S161XXA Strain of muscle, fascia and tendon at neck level, initial encounter: Secondary | ICD-10-CM

## 2020-12-25 DIAGNOSIS — S46911A Strain of unspecified muscle, fascia and tendon at shoulder and upper arm level, right arm, initial encounter: Secondary | ICD-10-CM

## 2020-12-25 LAB — RESP PANEL BY RT-PCR (FLU A&B, COVID) ARPGX2
Influenza A by PCR: NEGATIVE
Influenza B by PCR: NEGATIVE
SARS Coronavirus 2 by RT PCR: POSITIVE — AB

## 2020-12-25 MED ORDER — TRAMADOL HCL 50 MG PO TABS: 50.0000 mg | ORAL_TABLET | Freq: Four times a day (QID) | ORAL | 0 refills | Status: DC | PRN

## 2020-12-25 MED ORDER — BENZONATATE 100 MG PO CAPS: 100.0000 mg | ORAL_CAPSULE | Freq: Three times a day (TID) | ORAL | 0 refills | Status: DC

## 2020-12-25 NOTE — Discharge Instructions (Addendum)
Take ibuprofen 600 mg every 6 hours as needed for pain. Take tramadol as prescribed as needed for pain not relieved with ibuprofen.  Follow-up with your primary doctor if symptoms or not improving in the next week.

## 2020-12-25 NOTE — ED Notes (Signed)
Attempted to call pt and notify of covid results. No answer.

## 2020-12-25 NOTE — ED Notes (Signed)
Patient transported to X-ray 

## 2020-12-25 NOTE — ED Notes (Signed)
Portable Interpreter Pad used with ED Provider at bedside.
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
## 2021-03-27 ENCOUNTER — Emergency Department (HOSPITAL_COMMUNITY)
Admission: EM | Admit: 2021-03-27 | Discharge: 2021-03-27 | Disposition: A | Discharge status: Home / Self Care | Payer: Self-pay | Attending: Emergency Medicine | Admitting: Emergency Medicine

## 2021-03-27 ENCOUNTER — Other Ambulatory Visit: Payer: Self-pay

## 2021-03-27 ENCOUNTER — Emergency Department (HOSPITAL_COMMUNITY): Payer: Self-pay

## 2021-03-27 DIAGNOSIS — I10 Essential (primary) hypertension: Secondary | ICD-10-CM | POA: Insufficient documentation

## 2021-03-27 DIAGNOSIS — Z79899 Other long term (current) drug therapy: Secondary | ICD-10-CM | POA: Insufficient documentation

## 2021-03-27 DIAGNOSIS — R0789 Other chest pain: Secondary | ICD-10-CM | POA: Insufficient documentation

## 2021-03-27 LAB — CBC WITH DIFFERENTIAL/PLATELET
Abs Immature Granulocytes: 0.03 10*3/uL (ref 0.00–0.07)
Basophils Absolute: 0 10*3/uL (ref 0.0–0.1)
Basophils Relative: 0 %
Eosinophils Absolute: 0.1 10*3/uL (ref 0.0–0.5)
Eosinophils Relative: 2 %
HCT: 36 % (ref 36.0–46.0)
Hemoglobin: 11.9 g/dL — ABNORMAL LOW (ref 12.0–15.0)
Immature Granulocytes: 1 %
Lymphocytes Relative: 30 %
Lymphs Abs: 2 10*3/uL (ref 0.7–4.0)
MCH: 29.5 pg (ref 26.0–34.0)
MCHC: 33.1 g/dL (ref 30.0–36.0)
MCV: 89.1 fL (ref 80.0–100.0)
Monocytes Absolute: 0.4 10*3/uL (ref 0.1–1.0)
Monocytes Relative: 6 %
Neutro Abs: 3.9 10*3/uL (ref 1.7–7.7)
Neutrophils Relative %: 61 %
Platelets: 242 10*3/uL (ref 150–400)
RBC: 4.04 MIL/uL (ref 3.87–5.11)
RDW: 13.3 % (ref 11.5–15.5)
WBC: 6.5 10*3/uL (ref 4.0–10.5)
nRBC: 0 % (ref 0.0–0.2)

## 2021-03-27 LAB — COMPREHENSIVE METABOLIC PANEL
ALT: 20 U/L (ref 0–44)
AST: 22 U/L (ref 15–41)
Albumin: 3.8 g/dL (ref 3.5–5.0)
Alkaline Phosphatase: 93 U/L (ref 38–126)
Anion gap: 11 (ref 5–15)
BUN: 15 mg/dL (ref 6–20)
CO2: 24 mmol/L (ref 22–32)
Calcium: 8.4 mg/dL — ABNORMAL LOW (ref 8.9–10.3)
Chloride: 101 mmol/L (ref 98–111)
Creatinine, Ser: 0.68 mg/dL (ref 0.44–1.00)
GFR, Estimated: >60 mL/min (ref >60)
Glucose, Bld: 134 mg/dL — ABNORMAL HIGH (ref 70–99)
Potassium: 2.9 mmol/L — ABNORMAL LOW (ref 3.5–5.1)
Sodium: 136 mmol/L (ref 135–145)
Total Bilirubin: 0.5 mg/dL (ref 0.3–1.2)
Total Protein: 6.8 g/dL (ref 6.5–8.1)

## 2021-03-27 LAB — TROPONIN I (HIGH SENSITIVITY)
Troponin I (High Sensitivity): <2 ng/L (ref <18)
Troponin I (High Sensitivity): <2 ng/L (ref <18)

## 2021-03-27 LAB — LIPASE, BLOOD: Lipase: 58 U/L — ABNORMAL HIGH (ref 11–51)

## 2021-03-27 MED ORDER — AMLODIPINE BESYLATE 10 MG PO TABS: 10.0000 mg | ORAL_TABLET | Freq: Every day | ORAL | 0 refills | Status: DC

## 2021-03-27 MED ORDER — POTASSIUM CHLORIDE ER 10 MEQ PO TBCR: 10.0000 meq | EXTENDED_RELEASE_TABLET | Freq: Every day | ORAL | 0 refills | Status: DC

## 2021-03-27 MED ORDER — POTASSIUM CHLORIDE CRYS ER 20 MEQ PO TBCR
40.0000 meq | EXTENDED_RELEASE_TABLET | Freq: Once | ORAL | Status: AC
  Administered 2021-03-27: 40 meq via ORAL
  Filled 2021-03-27: qty 2

## 2021-03-27 MED ORDER — KETOROLAC TROMETHAMINE 30 MG/ML IJ SOLN
30.0000 mg | Freq: Once | INTRAMUSCULAR | Status: AC
  Administered 2021-03-27: 30 mg via INTRAVENOUS
  Filled 2021-03-27: qty 1

## 2021-03-27 NOTE — ED Provider Notes (Signed)
[facility] Provider Note   CSN: [id number] Arrival date & time: 03/27/21  0548     History Chief Complaint  Patient presents with  . Chest Pain    Sylvia Nguyen is a 50 y.o. female.  Patient is a 50 year old female with history of hypertension and kidney stones.  Patient presents today with complaints of chest pain.  She describes a 1 week history of intermittent tightness in the center of her chest radiating to the left neck and arm.  She says she occasionally feels short of breath and nauseated, but has not vomited.  She denies fevers or chills.  She denies exertional symptoms.  She denies any prior cardiac history.  Patient speaks very little [place], mainly [place].  History taken with the use of the translator tablet.  The history is provided by the patient.       Past Medical History:  Diagnosis Date  . Hypertension   . Nephrolithiasis   . Uterine fibroid     Patient Active Problem List   Diagnosis Date Noted  . Nephrolithiasis   . Polyp of rectum   . Abdominal pain, left lower quadrant 10/18/2019  . Abnormal CT scan, colon 10/18/2019  . Constipation 10/18/2019    Past Surgical History:  Procedure Laterality Date  . COLONOSCOPY N/A 03/30/2020   Dr. Oneida Alar: Tortuous left colon, 3 mm polyp removed benign.  Internal/external hemorrhoids Next colonoscopy in 10 years  . POLYPECTOMY  03/30/2020   Procedure: POLYPECTOMY;  Surgeon: Danie Binder, MD;  Location: [facility];  Service: Endoscopy;;  rectal     OB History   No obstetric history on file.     Family History  Problem Relation Age of Onset  . Other Father        MVA  . Colon cancer Neg Hx     Social History   Tobacco Use  . Smoking status: Never Smoker  . Smokeless tobacco: Never Used  Vaping Use  . Vaping Use: Never used  Substance Use Topics  . Alcohol use: No  . Drug use: No    Home Medications Prior to Admission medications   Medication Sig Start Date End  Date Taking? Authorizing Provider  cyclobenzaprine (FLEXERIL) 5 MG tablet Take by mouth. 01/17/21  Yes [provider]  amLODipine (NORVASC) 10 MG tablet TAKE 1 TABLET(S) ONCE DAILY FOR HIGH BLOOD PRESSURE 11/21/20   [provider]  benzonatate (TESSALON) 100 MG capsule Take 1 capsule (100 mg total) by mouth every 8 (eight) hours. 12/25/20   Veryl Speak, MD  lubiprostone (AMITIZA) 24 MCG capsule Take 1 capsule (24 mcg total) by mouth 2 (two) times daily with a meal. 10/31/20   Mahala Menghini, PA-C  tamsulosin (FLOMAX) 0.4 MG CAPS capsule Take 1 capsule daily until stone passes. 10/24/20   Molpus, John, MD  traMADol (ULTRAM) 50 MG tablet Take 1 tablet (50 mg total) by mouth every 6 (six) hours as needed. 12/25/20   Veryl Speak, MD    Allergies    Patient has no known allergies.  Review of Systems   Review of Systems  All other systems reviewed and are negative.   Physical Exam Updated Vital Signs BP 128/75 (BP Location: Right Arm)   Pulse (!) 58   Temp 98.1 F (36.7 C) (Oral)   Resp 20   SpO2 100%   Physical Exam Vitals and nursing note reviewed.  Constitutional:      General: She is not in  acute distress.    Appearance: She is well-developed. She is not diaphoretic.  HENT:     Head: Normocephalic and atraumatic.  Cardiovascular:     Rate and Rhythm: Normal rate and regular rhythm.     Heart sounds: No murmur heard. No friction rub. No gallop.   Pulmonary:     Effort: Pulmonary effort is normal. No respiratory distress.     Breath sounds: Normal breath sounds. No wheezing.  Abdominal:     General: Bowel sounds are normal. There is no distension.     Palpations: Abdomen is soft.     Tenderness: There is no abdominal tenderness.  Musculoskeletal:        General: Normal range of motion.     Cervical back: Normal range of motion and neck supple.     Right lower leg: No tenderness. No edema.     Left lower leg: No tenderness. No edema.  Skin:    General:  Skin is warm and dry.  Neurological:     Mental Status: She is alert and oriented to person, place, and time.     ED Results / Procedures / Treatments   Labs (all labs ordered are listed, but only abnormal results are displayed) Labs Reviewed  COMPREHENSIVE METABOLIC PANEL  CBC WITH DIFFERENTIAL/PLATELET  TROPONIN I (HIGH SENSITIVITY)    EKG EKG Interpretation  Date/Time:  Tuesday March 27 2021 06:00:33 EDT Ventricular Rate:  52 PR Interval:  167 QRS Duration: 99 QT Interval:  456 QTC Calculation: 425 R Axis:   81 Text Interpretation: Sinus rhythm Normal ECG Confirmed by Veryl Speak [phone]) on 03/27/2021 6:13:37 AM   Radiology No results found.  Procedures Procedures   Medications Ordered in ED Medications  ketorolac (TORADOL) 30 MG/ML injection 30 mg (has no administration in time range)    ED Course  I have reviewed the triage vital signs and the nursing notes.  Pertinent labs & imaging results that were available during my care of the patient were reviewed by me and considered in my medical decision making (see chart for details).    MDM Rules/Calculators/A&P  Patient presenting here with complaints of chest discomfort as described in the HPI.  Initial EKG shows no acute changes.  Laboratory studies are currently pending.  Care to be signed out to Dr. Roderic Palau at shift change.  He will obtain the results and determine the final disposition.  Final Clinical Impression(s) / ED Diagnoses Final diagnoses:  None    Rx / DC Orders ED Discharge Orders    None       Veryl Speak, MD 03/28/21 0236

## 2021-03-27 NOTE — Discharge Instructions (Signed)
Follow-up with your family doctor in a week.  And also call Dr. Constance Haw office cardiologist for a recheck

## 2021-03-27 NOTE — ED Triage Notes (Signed)
Anterior left chest pain that radiates to left arm and left neck started 4 days ago. Described as intermittent, tightness with sob and nausea.

## 2021-03-27 NOTE — ED Triage Notes (Signed)
Pt normally take potassium tablets but ran one week ago.

## 2021-06-10 ENCOUNTER — Emergency Department (HOSPITAL_BASED_OUTPATIENT_CLINIC_OR_DEPARTMENT_OTHER): Payer: Self-pay

## 2021-06-10 ENCOUNTER — Other Ambulatory Visit: Payer: Self-pay

## 2021-06-10 ENCOUNTER — Emergency Department (HOSPITAL_BASED_OUTPATIENT_CLINIC_OR_DEPARTMENT_OTHER)
Admission: EM | Admit: 2021-06-10 | Discharge: 2021-06-10 | Disposition: A | Discharge status: Home / Self Care | Payer: Self-pay | Attending: Emergency Medicine | Admitting: Emergency Medicine

## 2021-06-10 ENCOUNTER — Encounter (HOSPITAL_BASED_OUTPATIENT_CLINIC_OR_DEPARTMENT_OTHER): Payer: Self-pay

## 2021-06-10 DIAGNOSIS — I10 Essential (primary) hypertension: Secondary | ICD-10-CM | POA: Insufficient documentation

## 2021-06-10 DIAGNOSIS — Y92410 Unspecified street and highway as the place of occurrence of the external cause: Secondary | ICD-10-CM | POA: Insufficient documentation

## 2021-06-10 DIAGNOSIS — R519 Headache, unspecified: Secondary | ICD-10-CM

## 2021-06-10 DIAGNOSIS — S060X0A Concussion without loss of consciousness, initial encounter: Secondary | ICD-10-CM | POA: Insufficient documentation

## 2021-06-10 DIAGNOSIS — S161XXA Strain of muscle, fascia and tendon at neck level, initial encounter: Secondary | ICD-10-CM | POA: Insufficient documentation

## 2021-06-10 MED ORDER — TIZANIDINE HCL 4 MG PO TABS: 4.0000 mg | ORAL_TABLET | Freq: Four times a day (QID) | ORAL | 0 refills | Status: DC | PRN

## 2021-06-10 MED ORDER — IBUPROFEN 600 MG PO TABS: 600.0000 mg | ORAL_TABLET | Freq: Four times a day (QID) | ORAL | 0 refills | Status: DC | PRN

## 2021-06-10 MED ORDER — DICLOFENAC SODIUM 1 % EX GEL: 2.0000 g | Freq: Four times a day (QID) | CUTANEOUS | 0 refills | Status: DC
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
## 2021-06-20 ENCOUNTER — Encounter: Payer: Self-pay | Admitting: Neurology

## 2021-06-20 ENCOUNTER — Ambulatory Visit (INDEPENDENT_AMBULATORY_CARE_PROVIDER_SITE_OTHER): Payer: Self-pay | Admitting: Neurology

## 2021-06-20 VITALS — BP 116/62 | HR 68 | Ht 62.0 in | Wt 132.0 lb

## 2021-06-20 DIAGNOSIS — G4486 Cervicogenic headache: Secondary | ICD-10-CM

## 2021-06-20 MED ORDER — AMITRIPTYLINE HCL 25 MG PO TABS: 50.0000 mg | ORAL_TABLET | Freq: Every day | ORAL | 3 refills | Status: DC
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
## 2021-06-20 NOTE — Patient Instructions (Addendum)
It was nice to meet you both today.   Your neurological exam is normal thankfully.  I believe your headaches are likely tied in with your neck pain.  Please talk to your primary care physician about getting a referral to a spine specialist.  We will start a medication for chronic headaches for prevention, I would like for you to start a medication called Elavil (generic name: amitriptyline) 25 mg: Take half a pill daily at bedtime for one week, then one pill daily at bedtime for one week, then one and a half pills daily at bedtime for one week, then 2 pills daily at bedtime thereafter. Common side effects reported are: mouth dryness, drowsiness, confusion, dizziness.   Please follow-up to see one of our nurse practitioners in about 3 months in this clinic.

## 2021-07-23 ENCOUNTER — Encounter (HOSPITAL_BASED_OUTPATIENT_CLINIC_OR_DEPARTMENT_OTHER): Payer: Self-pay | Admitting: Obstetrics and Gynecology

## 2021-07-23 ENCOUNTER — Other Ambulatory Visit: Payer: Self-pay

## 2021-07-23 DIAGNOSIS — H5713 Ocular pain, bilateral: Secondary | ICD-10-CM | POA: Insufficient documentation

## 2021-07-23 DIAGNOSIS — I10 Essential (primary) hypertension: Secondary | ICD-10-CM | POA: Insufficient documentation

## 2021-07-23 DIAGNOSIS — X58XXXA Exposure to other specified factors, initial encounter: Secondary | ICD-10-CM | POA: Insufficient documentation

## 2021-07-23 DIAGNOSIS — T5991XA Toxic effect of unspecified gases, fumes and vapors, accidental (unintentional), initial encounter: Secondary | ICD-10-CM | POA: Insufficient documentation

## 2021-07-23 DIAGNOSIS — Z79899 Other long term (current) drug therapy: Secondary | ICD-10-CM | POA: Insufficient documentation
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
## 2021-07-24 ENCOUNTER — Emergency Department (HOSPITAL_BASED_OUTPATIENT_CLINIC_OR_DEPARTMENT_OTHER)
Admission: EM | Admit: 2021-07-24 | Discharge: 2021-07-24 | Disposition: A | Discharge status: Home / Self Care | Payer: Self-pay | Attending: Emergency Medicine | Admitting: Emergency Medicine

## 2021-07-24 DIAGNOSIS — Z77098 Contact with and (suspected) exposure to other hazardous, chiefly nonmedicinal, chemicals: Secondary | ICD-10-CM

## 2021-07-24 MED ORDER — ERYTHROMYCIN 5 MG/GM OP OINT
TOPICAL_OINTMENT | Freq: Three times a day (TID) | OPHTHALMIC | Status: DC
  Administered 2021-07-24: 1 via OPHTHALMIC
  Filled 2021-07-24: qty 3.5

## 2021-07-24 MED ORDER — TETRACAINE HCL 0.5 % OP SOLN
2.0000 [drp] | Freq: Once | OPHTHALMIC | Status: AC
  Administered 2021-07-24: 2 [drp] via OPHTHALMIC
  Filled 2021-07-24: qty 4

## 2021-07-24 MED ORDER — FLUORESCEIN SODIUM 1 MG OP STRP
1.0000 | ORAL_STRIP | Freq: Once | OPHTHALMIC | Status: AC
  Administered 2021-07-24: 1 via OPHTHALMIC
  Filled 2021-07-24: qty 1
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
## 2021-08-28 ENCOUNTER — Encounter: Payer: Self-pay | Admitting: Emergency Medicine

## 2021-08-28 ENCOUNTER — Ambulatory Visit
Admission: EM | Admit: 2021-08-28 | Discharge: 2021-08-28 | Disposition: A | Discharge status: Home / Self Care | Payer: Self-pay | Attending: Family Medicine | Admitting: Family Medicine

## 2021-08-28 DIAGNOSIS — N941 Unspecified dyspareunia: Secondary | ICD-10-CM | POA: Insufficient documentation

## 2021-08-28 DIAGNOSIS — N898 Other specified noninflammatory disorders of vagina: Secondary | ICD-10-CM | POA: Insufficient documentation

## 2021-08-28 MED ORDER — CEFTRIAXONE SODIUM 500 MG IJ SOLR
500.0000 mg | Freq: Once | INTRAMUSCULAR | Status: AC
  Administered 2021-08-28: 500 mg via INTRAMUSCULAR

## 2021-08-28 MED ORDER — DOXYCYCLINE HYCLATE 100 MG PO CAPS: 100.0000 mg | ORAL_CAPSULE | Freq: Two times a day (BID) | ORAL | 0 refills | Status: DC

## 2021-08-28 MED ORDER — FLUCONAZOLE 150 MG PO TABS: ORAL_TABLET | ORAL | 0 refills | Status: DC

## 2021-08-28 NOTE — ED Triage Notes (Addendum)
Pain after having intercourse and itching x  6 months

## 2021-08-28 NOTE — Discharge Instructions (Addendum)

## 2021-08-29 NOTE — ED Provider Notes (Addendum)
[facility]   [id number] 08/28/21 Arrival Time: [id number]  ASSESSMENT & PLAN:  1. Dyspareunia in female   2. Vaginal itching   3. Vaginal discharge    Meds ordered this encounter  Medications   doxycycline (VIBRAMYCIN) 100 MG capsule    Sig: Take 1 capsule (100 mg total) by mouth 2 (two) times daily.    Dispense:  14 capsule    Refill:  0   fluconazole (DIFLUCAN) 150 MG tablet    Sig: Take one tablet by mouth as a single dose. May repeat in 3 days if symptoms persist.    Dispense:  2 tablet    Refill:  0   cefTRIAXone (ROCEPHIN) injection 500 mg      Discharge Instructions      You have been given the following today for treatment of suspected gonorrhea and/or chlamydia:  cefTRIAXone (ROCEPHIN) injection 500 mg  Please pick up your prescription for doxycycline 100 mg and begin taking twice daily for the next seven (7) days.  Even though we have treated you today, we have sent testing for sexually transmitted infections. We will notify you of any positive results once they are received. If required, we will prescribe any medications you might need.  Please refrain from all sexual activity for at least the next seven days.     Without s/s of PID.  Labs Reviewed  CERVICOVAGINAL ANCILLARY ONLY    Will notify of any positive results. Instructed to refrain from sexual activity for at least seven days.  Reviewed expectations re: course of current medical issues. Questions answered. Outlined signs and symptoms indicating need for more acute intervention. Patient verbalized understanding. After Visit Summary given.   SUBJECTIVE:  Sylvia Nguyen is a 50 y.o. female who presents with complaint of vaginal discharge and painful intercourse. Onset gradual. First noticed  sev months ago . Denies: urinary frequency, dysuria, and gross hematuria. Afebrile. No abdominal or pelvic pain. Normal PO intake wihout n/v. No genital rashes or lesions. Reports that she is  sexually active with single female partner. OTC treatment: none.  No LMP recorded. Patient has had a hysterectomy.   OBJECTIVE:  Vitals:   08/28/21 1823  BP: 131/83  Pulse: 75  Resp: 18  Temp: 98.3 F (36.8 C)  TempSrc: Oral  SpO2: 98%     General appearance: alert, cooperative, appears stated age and no distress Lungs: unlabored respirations; speaks full sentences without difficulty Back: no CVA tenderness; FROM at waist Abdomen: soft, non-tender GU: deferred Skin: warm and dry Psychological: alert and cooperative; normal mood and affect.  Labs Reviewed  CERVICOVAGINAL ANCILLARY ONLY    No Known Allergies  Past Medical History:  Diagnosis Date   Hypertension    Nephrolithiasis    Uterine fibroid    Family History  Problem Relation Age of Onset   Other Father        MVA   Colon cancer Neg Hx    Social History   Socioeconomic History   Marital status: Single    Spouse name: Not on file   Number of children: Not on file   Years of education: Not on file   Highest education level: Not on file  Occupational History   Not on file  Tobacco Use   Smoking status: Never   Smokeless tobacco: Never  Vaping Use   Vaping Use: Never used  Substance and Sexual Activity   Alcohol use: No   Drug use: No   Sexual activity: Yes  Other Topics Concern   Not on file  Social History Narrative   Not on file   Social Determinants of Health   Financial Resource Strain: Not on file  Food Insecurity: Not on file  Transportation Needs: Not on file  Physical Activity: Not on file  Stress: Not on file  Social Connections: Not on file  Intimate Partner Violence: Not on file           Vanessa Kick, MD 08/29/21 [facility]    Vanessa Kick, MD 08/29/21 1554

## 2021-08-30 LAB — CERVICOVAGINAL ANCILLARY ONLY
Bacterial Vaginitis (gardnerella): POSITIVE — AB
Candida Glabrata: NEGATIVE
Candida Vaginitis: NEGATIVE
Chlamydia: NEGATIVE
Comment: NEGATIVE
Comment: NEGATIVE
Comment: NEGATIVE
Comment: NEGATIVE
Comment: NEGATIVE
Comment: NORMAL
Neisseria Gonorrhea: NEGATIVE
Trichomonas: NEGATIVE

## 2021-09-05 ENCOUNTER — Telehealth (HOSPITAL_COMMUNITY): Payer: Self-pay | Admitting: Emergency Medicine

## 2021-09-05 MED ORDER — METRONIDAZOLE 500 MG PO TABS
500.0000 mg | ORAL_TABLET | Freq: Two times a day (BID) | ORAL | 0 refills | Status: DC
Start: 2021-09-05 — End: 2021-10-05

## 2021-09-18 ENCOUNTER — Telehealth: Payer: Self-pay | Admitting: Family Medicine

## 2021-09-18 NOTE — Telephone Encounter (Signed)
Appt time chnged due to np schedule change.
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
## 2021-09-20 ENCOUNTER — Ambulatory Visit: Payer: Self-pay | Admitting: Family Medicine

## 2021-09-20 DIAGNOSIS — G4486 Cervicogenic headache: Secondary | ICD-10-CM

## 2021-10-05 ENCOUNTER — Emergency Department (HOSPITAL_BASED_OUTPATIENT_CLINIC_OR_DEPARTMENT_OTHER): Payer: Self-pay

## 2021-10-05 ENCOUNTER — Emergency Department (HOSPITAL_BASED_OUTPATIENT_CLINIC_OR_DEPARTMENT_OTHER)
Admission: EM | Admit: 2021-10-05 | Discharge: 2021-10-05 | Disposition: A | Discharge status: Home / Self Care | Payer: Self-pay | Attending: Emergency Medicine | Admitting: Emergency Medicine

## 2021-10-05 ENCOUNTER — Encounter (HOSPITAL_BASED_OUTPATIENT_CLINIC_OR_DEPARTMENT_OTHER): Payer: Self-pay | Admitting: *Deleted

## 2021-10-05 ENCOUNTER — Other Ambulatory Visit: Payer: Self-pay

## 2021-10-05 DIAGNOSIS — N739 Female pelvic inflammatory disease, unspecified: Secondary | ICD-10-CM | POA: Insufficient documentation

## 2021-10-05 DIAGNOSIS — R102 Pelvic and perineal pain: Secondary | ICD-10-CM

## 2021-10-05 DIAGNOSIS — I10 Essential (primary) hypertension: Secondary | ICD-10-CM | POA: Insufficient documentation

## 2021-10-05 DIAGNOSIS — Z79899 Other long term (current) drug therapy: Secondary | ICD-10-CM | POA: Insufficient documentation

## 2021-10-05 LAB — URINALYSIS, ROUTINE W REFLEX MICROSCOPIC
Bilirubin Urine: NEGATIVE
Glucose, UA: NEGATIVE mg/dL
Ketones, ur: NEGATIVE mg/dL
Nitrite: NEGATIVE
Protein, ur: NEGATIVE mg/dL
Specific Gravity, Urine: 1.02 (ref 1.005–1.030)
pH: 6 (ref 5.0–8.0)

## 2021-10-05 LAB — CBC WITH DIFFERENTIAL/PLATELET
Abs Immature Granulocytes: 0.02 10*3/uL (ref 0.00–0.07)
Basophils Absolute: 0 10*3/uL (ref 0.0–0.1)
Basophils Relative: 0 %
Eosinophils Absolute: 0.1 10*3/uL (ref 0.0–0.5)
Eosinophils Relative: 1 %
HCT: 38.5 % (ref 36.0–46.0)
Hemoglobin: 13.2 g/dL (ref 12.0–15.0)
Immature Granulocytes: 0 %
Lymphocytes Relative: 36 %
Lymphs Abs: 2.2 10*3/uL (ref 0.7–4.0)
MCH: 29.7 pg (ref 26.0–34.0)
MCHC: 34.3 g/dL (ref 30.0–36.0)
MCV: 86.7 fL (ref 80.0–100.0)
Monocytes Absolute: 0.5 10*3/uL (ref 0.1–1.0)
Monocytes Relative: 8 %
Neutro Abs: 3.3 10*3/uL (ref 1.7–7.7)
Neutrophils Relative %: 55 %
Platelets: 308 10*3/uL (ref 150–400)
RBC: 4.44 MIL/uL (ref 3.87–5.11)
RDW: 13.1 % (ref 11.5–15.5)
WBC: 6 10*3/uL (ref 4.0–10.5)
nRBC: 0 % (ref 0.0–0.2)

## 2021-10-05 LAB — PREGNANCY, URINE: Preg Test, Ur: NEGATIVE

## 2021-10-05 LAB — WET PREP, GENITAL
Sperm: NONE SEEN
Trich, Wet Prep: NONE SEEN
Yeast Wet Prep HPF POC: NONE SEEN

## 2021-10-05 LAB — URINALYSIS, MICROSCOPIC (REFLEX)

## 2021-10-05 MED ORDER — METRONIDAZOLE 500 MG PO TABS: 500.0000 mg | ORAL_TABLET | Freq: Two times a day (BID) | ORAL | 0 refills | Status: DC

## 2021-10-05 MED ORDER — OXYCODONE-ACETAMINOPHEN 5-325 MG PO TABS
1.0000 | ORAL_TABLET | Freq: Once | ORAL | Status: AC
  Administered 2021-10-05: 1 via ORAL
  Filled 2021-10-05: qty 1

## 2021-10-05 MED ORDER — CEFTRIAXONE SODIUM 1 G IJ SOLR
1.0000 g | Freq: Once | INTRAMUSCULAR | Status: AC
  Administered 2021-10-05: 1 g via INTRAMUSCULAR
  Filled 2021-10-05: qty 10

## 2021-10-05 MED ORDER — DOXYCYCLINE HYCLATE 100 MG PO CAPS: 100.0000 mg | ORAL_CAPSULE | Freq: Two times a day (BID) | ORAL | 0 refills | Status: DC

## 2021-10-05 NOTE — ED Notes (Signed)
First contact with patient. Patient arrived via triage from home with family at bedside with complaints of vaginal discomfort x 1 week. Will wait for provider for a physical exam. Pt is A&OX 4. Respirations even/unlabored. Patient changed into gown and placed on monitor and call light within reach. Patient updated on plan of care. Spanish interpreter used. Will continue to monitor patient.

## 2021-10-05 NOTE — Discharge Instructions (Addendum)
You were seen here today for evaluation of your pelvic pain.  Your cervical swabs showed you have clue cells and white blood cells present still.  I discussed with the gynecologist who wants you to follow-up with the practice within the week for continuation and monitoring of care.  It is very important you follow-up with the gynecology practice.  If you have any worsening of pain, fevers, abnormal vaginal discharge, dysuria, vaginal bleeding, please return to the nearest emergency department.   La vieron aqu hoy para evaluar su dolor plvico. Sus frotis cervicales mostraron que todava tiene clulas clave y glbulos blancos presentes. Habl con el gineclogo que quiere que haga un seguimiento con la prctica dentro de la semana para la continuacin y el control de la atencin. Es muy importante que haga un seguimiento con la prctica de Animal nutritionist. Si tiene un empeoramiento del dolor, fiebre, flujo vaginal anormal, disuria, sangrado vaginal, regrese al departamento de emergencias ms cercano.

## 2021-10-05 NOTE — ED Triage Notes (Signed)
Vaginal pain for a week. She was treated for BV a month ago.

## 2021-10-05 NOTE — ED Notes (Signed)
Discharge instructions with virtual translator [facility]) closed loop communication.

## 2021-10-05 NOTE — ED Provider Notes (Signed)
[facility] EMERGENCY DEPARTMENT Provider Note   CSN: [id number] Arrival date & time: 10/05/21  1423     History Chief Complaint  Patient presents with   Vaginal Pain    Sylvia Nguyen is a 50 y.o. female resents to the emergency department with 3 weeks of worsening, burning pelvic pain.  The patient was recently treated for BV and prophylactically treated for STDs on the first week of September.  She reports she was compliant with medication and finish.  She reports the pain feels better with drinking lots of cold water and hurts with lifting things.  Additionally, she reports diffuse body aches that are in different areas on different times that are relieved with pain pills.  Denies any joint pain, blurry vision, vaginal discharge, dyspareunia, urinary retention, urgency frequency, dysuria, hematuria, diarrhea.  Denies any abdominal pain, nausea, vomiting, fevers.  Patient reports she occasionally has constipation but drinks juice every day to have a daily bowel movements.  Additionally, she mentions she just recently had an episode of vaginal bleeding for her menses that lasted 10 days.  Normal bleeding for her menstrual cycle.  Not saturating more than 3 pads a day.  She could not tell if her periods were regular or irregular.  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  Unknown what this was.  Additionally she mentions that if any uterine or fibroid during this work-up.   Even with the exam being transited with a translator service, patient was a poor historian and was changing timelines and symptoms of events multiple times.  The entirety of this interview was with a Oceanographer.   Vaginal Pain Pertinent negatives include no chest pain, no abdominal pain and no shortness of breath.      Past Medical History:  Diagnosis Date   Hypertension    Nephrolithiasis    Uterine fibroid     Patient  Active Problem List   Diagnosis Date Noted   Nephrolithiasis    Polyp of rectum    Abdominal pain, left lower quadrant 10/18/2019   Abnormal CT scan, colon 10/18/2019   Constipation 10/18/2019    Past Surgical History:  Procedure Laterality Date   COLONOSCOPY N/A 03/30/2020   Dr. Oneida Alar: Tortuous left colon, 3 mm polyp removed benign.  Internal/external hemorrhoids Next colonoscopy in 10 years   POLYPECTOMY  03/30/2020   Procedure: POLYPECTOMY;  Surgeon: Danie Binder, MD;  Location: [facility];  Service: Endoscopy;;  rectal     OB History     Gravida      Para      Term      Preterm      AB      Living  4      SAB      IAB      Ectopic      Multiple      Live Births              Family History  Problem Relation Age of Onset   Other Father        MVA   Colon cancer Neg Hx     Social History   Tobacco Use   Smoking status: Never   Smokeless tobacco: Never  Vaping Use   Vaping Use: Never used  Substance Use Topics   Alcohol use: No   Drug use: No    Home Medications Prior to  Admission medications   Medication Sig Start Date End Date Taking? Authorizing Provider  doxycycline (VIBRAMYCIN) 100 MG capsule Take 1 capsule (100 mg total) by mouth 2 (two) times daily. 10/05/21  Yes Sherrell Puller, PA-C  metroNIDAZOLE (FLAGYL) 500 MG tablet Take 1 tablet (500 mg total) by mouth 2 (two) times daily. 10/05/21  Yes Sherrell Puller, PA-C  amitriptyline (ELAVIL) 25 MG tablet Take 2 tablets (50 mg total) by mouth at bedtime. Please follow written titration instructions provided separately. 06/20/21   Star Age, MD  amLODipine (NORVASC) 10 MG tablet Take 1 tablet (10 mg total) by mouth daily. 03/27/21   Milton Ferguson, MD  diclofenac Sodium (VOLTAREN) 1 % GEL Apply 2 g topically 4 (four) times daily. 06/10/21   Long, Wonda Olds, MD  fluconazole (DIFLUCAN) 150 MG tablet Take one tablet by mouth as a single dose. May repeat in 3 days if symptoms persist. 08/28/21    Vanessa Kick, MD  potassium chloride (KLOR-CON) 10 MEQ tablet Take 1 tablet (10 mEq total) by mouth daily. Patient not taking: Reported on 06/20/2021 03/27/21   Milton Ferguson, MD  tiZANidine (ZANAFLEX) 4 MG tablet Take 1 tablet (4 mg total) by mouth every 6 (six) hours as needed for muscle spasms. 06/10/21   Long, Wonda Olds, MD    Allergies    Patient has no known allergies.  Review of Systems   Review of Systems  Constitutional:  Negative for chills and fever.  HENT:  Negative for ear pain and sore throat.   Eyes:  Negative for pain and visual disturbance.  Respiratory:  Negative for cough and shortness of breath.   Cardiovascular:  Negative for chest pain and palpitations.  Gastrointestinal:  Negative for abdominal pain and vomiting.  Genitourinary:  Positive for vaginal bleeding and vaginal pain. Negative for dysuria, frequency, genital sores, hematuria and urgency.  Musculoskeletal:  Positive for myalgias. Negative for arthralgias, back pain and joint swelling.  Skin:  Negative for color change and rash.  Neurological:  Negative for seizures and syncope.  All other systems reviewed and are negative.  Physical Exam Updated Vital Signs BP (!) 163/98 (BP Location: Right Arm)   Pulse 67   Temp 98.7 F (37.1 C) (Oral)   Resp 16   Ht 5\' 2"  (1.575 m)   Wt 59.9 kg   SpO2 100%   BMI 24.15 kg/m   Physical Exam Vitals and nursing note reviewed. Exam conducted with a chaperone present Marye Round, Therapist, sports).  Constitutional:      Appearance: Normal appearance.  HENT:     Head: Normocephalic and atraumatic.  Eyes:     General: No scleral icterus. Cardiovascular:     Rate and Rhythm: Normal rate and regular rhythm.  Pulmonary:     Effort: Pulmonary effort is normal.     Breath sounds: Normal breath sounds.  Abdominal:     General: Abdomen is flat. Bowel sounds are normal.     Palpations: Abdomen is soft.     Tenderness: There is no abdominal tenderness. There is no guarding or rebound.   Genitourinary:    Labia:        Right: No rash, tenderness, lesion or injury.        Left: No rash, tenderness, lesion or injury.      Vagina: No foreign body. Vaginal discharge present. No erythema, tenderness or bleeding.     Cervix: Normal.     Comments: On exam, there is mucopurulent discharge from the cervical os.  No  chandelier sign.  No CMT.  Cervix is friable without bleeding.  Vaginal wall mucosa pink with slightly decreased rugae.  No foreign bodies noted.  No trauma noted to the area. Musculoskeletal:        General: No swelling, tenderness or deformity.     Cervical back: Normal range of motion.  Skin:    General: Skin is warm and dry.     Findings: No rash.  Neurological:     General: No focal deficit present.     Mental Status: She is alert. Mental status is at baseline.    ED Results / Procedures / Treatments   Labs (all labs ordered are listed, but only abnormal results are displayed) Labs Reviewed  WET PREP, GENITAL - Abnormal; Notable for the following components:      Result Value   Clue Cells Wet Prep HPF POC PRESENT (*)    WBC, Wet Prep HPF POC MANY (*)    All other components within normal limits  URINALYSIS, ROUTINE W REFLEX MICROSCOPIC - Abnormal; Notable for the following components:   Hgb urine dipstick TRACE (*)    Leukocytes,Ua SMALL (*)    All other components within normal limits  URINALYSIS, MICROSCOPIC (REFLEX) - Abnormal; Notable for the following components:   Bacteria, UA FEW (*)    All other components within normal limits  PREGNANCY, URINE  CBC WITH DIFFERENTIAL/PLATELET  GC/CHLAMYDIA PROBE AMP ([place]) NOT AT [facility]    EKG None  Radiology No results found.  Procedures Procedures   Medications Ordered in ED Medications  oxyCODONE-acetaminophen (PERCOCET/ROXICET) 5-325 MG per tablet 1 tablet (1 tablet Oral Given 10/05/21 2052)  cefTRIAXone (ROCEPHIN) injection 1 g (1 g Intramuscular Given 10/05/21 2140)    ED Course  I  have reviewed the triage vital signs and the nursing notes.  Pertinent labs & imaging results that were available during my care of the patient were reviewed by me and considered in my medical decision making (see chart for details).  This patient was seen for ongoing pelvic pain.  Was given Percocet for pain.  I personally reviewed the patient's labs and imaging.  Urinalysis shows trace blood with small leuks, with few bacteria and 6-10 white blood cells seen in urine.  Wet prep showed clue cells present with many white blood cells.  Urinalysis along with wet prep indicative of some cervical infection.  Negative pregnancy test.  GC chlamydia probe ordered.  CBC shows no leukocytosis or anemia given patient's possible infection and recent vaginal bleeding.  CT pelvis negative other than 1.6 cm ovarian cyst.  No fibroids noted.  Patient's cervical exam concern for cervicitis or PID.  Cervix is friable with mucopurulent discharge from the os and in the vaginal vault.  No bleeding noted.  No trauma to the area.  No foreign bodies noted.  Given patient's possible HPV, concern for possible malignancy.  Will consult gynecology.  Given these persistent clue cells and recent treatment for gonorrhea, chlamydia, and BV, I consulted gynecology, Dr. Steva Ready, who recommended no treatment for her possible ongoing cervicitis and to follow-up with gynecology that week.  At this time, the patient reported her antibiotics were finished 1.5 weeks ago.  When almost discharge, she then mentions that she finished antibiotics 3 weeks ago, but then mentions something about a time being in August- all in all she could not give me a definite time of when the last time she took antibiotics was or if she completed all of them.  At this point, due to unclear time it if she finished antibiotics or when she took them, Rocephin, doxycycline, and metronidazole will be ordered for patient's treatment.  She was given a referral for ambulatory  gynecology and also to follow-up with [facility].  They are to call whoever can see her the soonest quick given her cervical exam and persistent cervicitis.  I shared with her the importance of finishing these antibiotics as prescribed including the entire course.  I also stressed that is very important to follow-up with gynecology to that she can be seen for this ongoing problem and to be managed more closely by a specialized practice. Recommended Tylenol or ibuprofen as needed for pain. Return precautions given to patient.  Patient agrees to plan.  Patient is stable and being discharged home good condition.   MDM Rules/Calculators/A&P  Final Clinical Impression(s) / ED Diagnoses Final diagnoses:  Pelvic pain  PID (pelvic inflammatory disease)    Rx / DC Orders ED Discharge Orders          Ordered    Ambulatory referral to Gynecology        10/05/21 1946    doxycycline (VIBRAMYCIN) 100 MG capsule  2 times daily        10/05/21 2130    metroNIDAZOLE (FLAGYL) 500 MG tablet  2 times daily        10/05/21 2130             Sherrell Puller, PA-C 10/07/21 2107    Lennice Sites, DO 10/08/21 1011

## 2021-10-08 LAB — GC/CHLAMYDIA PROBE AMP (~~LOC~~) NOT AT ARMC
Chlamydia: NEGATIVE
Comment: NEGATIVE
Comment: NORMAL
Neisseria Gonorrhea: NEGATIVE

## 2021-10-26 ENCOUNTER — Other Ambulatory Visit: Payer: Self-pay

## 2021-10-26 ENCOUNTER — Ambulatory Visit (INDEPENDENT_AMBULATORY_CARE_PROVIDER_SITE_OTHER): Payer: Self-pay | Admitting: Obstetrics and Gynecology

## 2021-10-26 ENCOUNTER — Encounter: Payer: Self-pay | Admitting: Obstetrics and Gynecology

## 2021-10-26 VITALS — BP 118/78 | Ht 61.0 in | Wt 142.0 lb

## 2021-10-26 DIAGNOSIS — N816 Rectocele: Secondary | ICD-10-CM

## 2021-10-26 DIAGNOSIS — N762 Acute vulvitis: Secondary | ICD-10-CM

## 2021-10-26 DIAGNOSIS — K5900 Constipation, unspecified: Secondary | ICD-10-CM

## 2021-10-26 DIAGNOSIS — Z113 Encounter for screening for infections with a predominantly sexual mode of transmission: Secondary | ICD-10-CM

## 2021-10-26 DIAGNOSIS — N8111 Cystocele, midline: Secondary | ICD-10-CM

## 2021-10-26 DIAGNOSIS — R102 Pelvic and perineal pain: Secondary | ICD-10-CM

## 2021-10-26 DIAGNOSIS — R109 Unspecified abdominal pain: Secondary | ICD-10-CM

## 2021-10-26 LAB — WET PREP FOR TRICH, YEAST, CLUE

## 2021-10-26 MED ORDER — BETAMETHASONE VALERATE 0.1 % EX OINT: TOPICAL_OINTMENT | CUTANEOUS | 0 refills | Status: DC

## 2021-10-26 NOTE — Patient Instructions (Signed)
Try miralax daily for constipation.   Prolapso de los rganos de la pelvis Pelvic Organ Prolapse Un prolapso de los rganos de la pelvis es una afeccin en las mujeres que implica el estiramiento, la dilatacin o la cada de los rganos de la pelvis a una posicin anormal, ms all de la abertura vaginal. Esto sucede cuando los msculos y tejidos que rodean y sostienen las estructuras plvicas se debilitan o estiran. El prolapso de los rganos de la pelvis puede involucrar a los siguientes rganos: Vagina (prolapso vaginal). tero (prolapso uterino). Vejiga (cistocele). Recto (rectocele). Intestinos (enterocele). Cuando estn comprometidos rganos diferentes a la vagina, estos a menudo se protruyen hacia adentro de la vagina o hacia afuera de la vagina, segn la gravedad del prolapso. Cules son las causas? Esta afeccin puede ser causada por lo siguiente: Derald Macleod de parto y [place]. Ciruga de pelvis anterior. Niveles ms bajos de la hormona estrgeno debido a la menopausia. [place] sistemticamente objetos que pesan ms de 50 libras (23 kg). Obesidad. Dificultad para defecar a largo plazo (estreimiento crnico). Tos crnica o de larga duracin. Acumulacin de lquido en el abdomen debido a ciertas afecciones. Cules son los signos o sntomas? Los sntomas de esta afeccin incluyen: Escape de [place] (prdida del control de la vejiga) al toser, Brewing technologist, Optometrist esfuerzo y ejercicio (incontinencia urinaria de esfuerzo). Esto puede empeorar inmediatamente despus del parto. Puede mejorar gradualmente con el tiempo. Sensacin de presin en la pelvis o la vagina. Esta presin puede aumentar al toser o al defecar. Un bulto que sobresale de la abertura de la vagina. Dificultad para Production assistant, radio. Dolor en la parte inferior de la espalda. Scio o disminucin del inters sexual. Infecciones reiteradas en la vejiga (infecciones de las  vas [place]). Dificultad para insertar un tampn. En algunas personas, esta afeccin no causa sntomas. Cmo se diagnostica? Esta afeccin puede diagnosticarse con un examen de la vagina y un examen rectal. Durante el examen, pueden pedirle que tosa y que haga un esfuerzo mientras est [place], sentada y de pie. El mdico determinar si se requieren otros estudios, como las pruebas de la funcin de la vejiga. Cmo se trata? El tratamiento de esta afeccin puede depender de los sntomas. El tratamiento puede incluir: Cambios en el estilo de vida, como beber mucho lquido y comer alimentos ricos en [place]. Vaciar la vejiga en horarios programados (terapia de entrenamiento de la vejiga). Esto puede ayudar a reducir o Mining engineer incontinencia urinaria. Estrgeno. Si el prolapso es leve, el estrgeno puede ayudar al aumentar la fuerza y el tono de los msculos del suelo plvico. Ejercicios de Kegel. Estos ejercicios pueden ayudar en los casos leves de prolapso al Training and development officer y tensar los msculos del suelo plvico. Un dispositivo flexible que ayuda a sostener las paredes vaginales y Theatre manager los rganos de la pelvis en su lugar (pesario). El mdico insertar este dispositivo en la vagina. Ciruga. Esta es a menudo la nica forma de tratamiento de los casos graves de prolapso. Siga estas instrucciones en su casa: Comida y bebida Evite ingerir bebidas que contengan cafena o alcohol. Aumente su ingesta de alimentos ricos en fibra para disminuir el estreimiento y el esfuerzo [place]. Actividad [place] peso si se lo recomienda el mdico. Evite levantar objetos pesados y Optometrist esfuerzos mientras se ejercita o trabaja. No contenga la respiracin cuando levante pesas y realice ejercicios de intensidad leve a moderada. Limite sus actividades como se lo haya indicado el mdico. Realice  los ejercicios de Kegel como se lo haya indicado el mdico. Para hacer esto: Contraiga con fuerza los  msculos del suelo plvico. Debe sentir que se eleva y comprime el rea rectal y tensin en el rea vaginal. Mantenga el estmago, las nalgas y las piernas [place]. Mantenga los msculos tensos durante 10 segundos como mximo. Luego relaje los msculos. Repita este ejercicio [place] veces que le haya indicado el mdico. Contine haciendo este ejercicio durante al menos 4 a 6 semanas o el tiempo que le haya indicado su mdico. Indicaciones generales Use los medicamentos de venta libre y los recetados solamente como se lo haya indicado el mdico. Use una toalla higinica o paales para adultos si tiene incontinencia urinaria. Si tiene puesto un pesario, cudelo como se lo haya indicado su mdico. Cumpla con todas las visitas de seguimiento. Esto es importante. Comunquese con un mdico si: Tiene sntomas que interfieren con sus actividades diarias o su vida sexual. Necesita medicamentos para [employer]. Observa un sangrado de la vagina no relacionado con su perodo menstrual. Tiene fiebre. Siente dolor o sangra al [employer]. Sangra al defecar. Pierde orina [employer]. Tiene estreimiento crnico. Su pesario se Diplomatic Services operational officer. Tiene secrecin vaginal con mal olor. Tiene un dolor bajo e inusual en el abdomen. Solicite ayuda de inmediato si: No puede orinar. Resumen Un prolapso de los rganos de la pelvis es el estiramiento, la dilatacin o la cada de los rganos de la pelvis a una posicin anormal. Esto sucede cuando los msculos y tejidos que rodean y sostienen las estructuras plvicas se debilitan o estiran. Cuando estn comprometidos rganos diferentes a la vagina, estos a menudo se protruyen hacia adentro o hacia afuera de la vagina, segn la gravedad del prolapso. En la [employer], esta afeccin debe tratarse solo si produce sntomas. El tratamiento puede incluir cambios en el estilo de vida, estrgeno, ejercicios de Kegel, la insercin de un pesario o  [place]. Evite levantar objetos pesados y Armed forces training and education officer se ejercita o trabaja. No contenga la respiracin cuando levante pesas y realice ejercicios de intensidad leve a moderada. Limite sus actividades como se lo haya indicado el mdico. Esta informacin no tiene Marine scientist el consejo del mdico. Asegrese de hacerle al mdico cualquier pregunta que tenga. Document Revised: 07/13/2020 Document Reviewed: 07/13/2020 Elsevier Patient Education  [place]. About Constipation  Constipation Overview Constipation is the most common gastrointestinal complaint -- about 4 million Americans experience constipation and make 2.5 million physician visits a year to get help for the problem.  Constipation can occur when the colon absorbs too much water, the colon's muscle contraction is slow or sluggish, and/or there is delayed transit time through the colon.  The result is stool that is hard and dry.  Indicators of constipation include straining during bowel movements greater than 25% of the time, having fewer than three bowel movements per week, and/or the feeling of incomplete evacuation.  There are established guidelines ([place] ) for defining constipation. A person needs to have two or more of the following symptoms for at least 12 weeks (not necessarily consecutive) in the preceding 12 months: Straining in  greater than 25% of bowel movements Lumpy or hard stools in greater than 25% of bowel movements Sensation of incomplete emptying in greater than 25% of bowel movements Sensation of anorectal obstruction/blockade in greater than 25% of bowel movements Manual maneuvers to help empty greater than 25% of bowel movements (  e.g., digital evacuation, support of the pelvic floor)  Less than  3 bowel movements/week Loose stools are not present, and criteria for irritable bowel syndrome are insufficient  Common Causes of Constipation Lack of fiber in your diet Lack of physical  activity Medications, including iron and calcium supplements  Dairy intake Dehydration Abuse of laxatives Travel Irritable Bowel Syndrome Pregnancy Luteal phase of menstruation (after ovulation and before menses) Colorectal problems Intestinal Dysfunction  Treating Constipation  There are several ways of treating constipation, including changes to diet and exercise, use of laxatives, adjustments to the pelvic floor, and scheduled toileting.  These treatments include: increasing fiber and fluids in the diet  increasing physical activity learning muscle coordination  learning proper toileting techniques and toileting modifications  designing and sticking  to a toileting schedule     2007, [employer] Doc.22

## 2021-10-26 NOTE — Progress Notes (Signed)
50 y.o. No obstetric history on file. Single White or Caucasian Hispanic or Latino female here for ER f/u and pain.    08/28/21 treated in ER for suspected PID. Testing with neg GC/CT/Trich, +BV 10/05/21 again treated for PID (unsure if first treatment was taken). She had negative GC/CT, WBC 6, hgb 13.2 10/05/21 CT: uterus unremarkable, left adnexa unremarkable, 1.6 cm right ovarian cyst.   She has had intermittent pain in her LLQ for years. Worse when she is lifting. The pain moves to the front and the back.  She gets the pain daily, can last for hours. The pain improves with lying down.  She feels pressure at her vaginal opening sometimes.  She has bladder symptoms of frequent urination. She has nocturia 3-4 x a night. She voids large amounts. No dyspareunia. She is constipated all the time. She has a BM every 3-4 days. This has been going on for several years. Feels the pain is better after a BM.   She is having vaginal itching, feels irritated from her soap. No abnormal d/c.   She is separated from her husband. Not sexually active for over 2 months. Thinks he may have cheated.   U/S from 06/17/19: Uterus   Measurements: 11.1 x 5.0 x 6.6 cm = volume: 193.8 mL. 3.4 x 2.6 x 2.7 cm intramural/submucosal fibroid at the left posterior uterine body. 8 x 7 x 9 mm intramural fibroid present at the anterior uterine fundus. 8 x 7 x 7 mm intramural fibroid present at the left posterior uterine body.  Period Cycle (Days): 28 (Occas starts a week early or late) Period Duration (Days): 5 Period Pattern: Regular Menstrual Flow: Moderate Dysmenorrhea: (!) Moderate Dysmenorrhea Symptoms: Cramping Changes a pad 3 x a day. No worsening of pain around her cycle.   Patient's last menstrual period was 09/25/2021.          Sexually active: Yes.    The current method of family planning is none.    Exercising: Yes.     walk Smoker:  no  Health Maintenance: Pap: 03-2021 normal, states her prior pap was  abnormal.  History of abnormal Pap:  yes MMG:  08-15-20 normal BMD:   N/A Colonoscopy: 2021 TDaP:  Unsure Gardasil: N/A   reports that she has never smoked. She has never used smokeless tobacco. She reports that she does not drink alcohol and does not use drugs.  Past Medical History:  Diagnosis Date   Hypertension    Nephrolithiasis    Uterine fibroid     Past Surgical History:  Procedure Laterality Date   COLONOSCOPY N/A 03/30/2020   Dr. Oneida Alar: Tortuous left colon, 3 mm polyp removed benign.  Internal/external hemorrhoids Next colonoscopy in 10 years   POLYPECTOMY  03/30/2020   Procedure: POLYPECTOMY;  Surgeon: Danie Binder, MD;  Location: [facility];  Service: Endoscopy;;  rectal    Current Outpatient Medications  Medication Sig Dispense Refill   amLODipine (NORVASC) 10 MG tablet Take 1 tablet (10 mg total) by mouth daily. 30 tablet 0   diclofenac Sodium (VOLTAREN) 1 % GEL Apply 2 g topically 4 (four) times daily. 50 g 0   tiZANidine (ZANAFLEX) 4 MG tablet Take 1 tablet (4 mg total) by mouth every 6 (six) hours as needed for muscle spasms. 20 tablet 0   amitriptyline (ELAVIL) 25 MG tablet Take 2 tablets (50 mg total) by mouth at bedtime. Please follow written titration instructions provided separately. (Patient not taking: Reported on 10/26/2021) 60  tablet 3   doxycycline (VIBRAMYCIN) 100 MG capsule Take 1 capsule (100 mg total) by mouth 2 (two) times daily. (Patient not taking: Reported on 10/26/2021) 14 capsule 0   fluconazole (DIFLUCAN) 150 MG tablet Take one tablet by mouth as a single dose. May repeat in 3 days if symptoms persist. (Patient not taking: Reported on 10/26/2021) 2 tablet 0   metroNIDAZOLE (FLAGYL) 500 MG tablet Take 1 tablet (500 mg total) by mouth 2 (two) times daily. (Patient not taking: Reported on 10/26/2021) 28 tablet 0   potassium chloride (KLOR-CON) 10 MEQ tablet Take 1 tablet (10 mEq total) by mouth daily. (Patient not taking: Reported on 10/26/2021) 15  tablet 0   No current facility-administered medications for this visit.    Family History  Problem Relation Age of Onset   Other Father        MVA   Colon cancer Neg Hx     Review of Systems  Exam:   BP 118/78   Ht 5\' 1"  (1.549 m)   Wt 142 lb (64.4 kg)   LMP 09/25/2021   BMI 26.83 kg/m   Weight change: @WEIGHTCHANGE @ Height:   Height: 5\' 1"  (154.9 cm)  Ht Readings from Last 3 Encounters:  10/26/21 5\' 1"  (1.549 m)  10/05/21 5\' 2"  (1.575 m)  06/20/21 5\' 2"  (1.575 m)    General appearance: alert, cooperative and appears stated age Abdomen: soft, mildly tender in the LLQ and left mid abdomen, non distended,  no masses,  no organomegaly Extremities: extremities normal, atraumatic, no cyanosis or edema Skin: Skin color, texture, turgor normal. No rashes or lesions Lymph nodes: Cervical, supraclavicular, and axillary nodes normal. No abnormal inguinal nodes palpated Neurologic: Grossly normal   Pelvic: External genitalia:  no lesions              Urethra:  normal appearing urethra with no masses, tenderness or lesions              Bartholins and Skenes: normal                 Vagina: normal appearing vagina with normal color and discharge, no lesions. Small grade 2 cystocele and rectocele              Cervix: no lesions and + ectropion, no cervical motion tenderness               Bimanual Exam:  Uterus:  normal size, contour, position, consistency, mobility, non-tender              Adnexa:  no masses, bilateral mild tenderness               Rectovaginal: Confirms               Anus:  normal sphincter tone, no lesions  Pelvic floor: mildly tender on the right  Caryn Bee, CMA chaperoned for the exam.  1. Combined abdominal and pelvic pain Normal pelvic CT, normal WBC, negative GC/CT/Trich. No CMT or uterine tenderness Suspect GI etiology of pain. She has constipation, pain improves with BM. Worst pain in location of descending colon  2. Screening examination for STD  (sexually transmitted disease) Desires blood work, negative cervical cultures - RPR - HIV Antibody (routine testing w rflx)  3. Acute vulvitis - WET PREP FOR TRICH, YEAST, CLUE: negative - betamethasone valerate ointment (VALISONE) 0.1 %; Use a pea sized amount BID for up to 2 weeks as needed  Dispense: 30 g; Refill: 0 -vulvar  skin care information given  4. Constipation, unspecified constipation type Take daily miralax F/u with primary if not improving  5. Midline cystocele Discussed Avoid heavy lifting and straining ACOG handout given Reviewed option of pessary and surgery (wouldn't start with surgery) Also discussed risk of recurrence with surgery  6. Rectocele Discussed Avoid heavy lifting and straining ACOG handout given Reviewed option of pessary and surgery (wouldn't start with surgery) Also discussed risk of recurrence with surgery  The patient was seen with an interpreter  Addendum: patient will have a copy of her paps sent here. She thinks she was supposed to have a biopsy but reports that her last pap was normal.   58 minutes spent in total patient care

## 2021-10-29 ENCOUNTER — Ambulatory Visit: Payer: Self-pay | Admitting: Nurse Practitioner

## 2021-10-29 LAB — HIV ANTIBODY (ROUTINE TESTING W REFLEX): HIV 1&2 Ab, 4th Generation: NONREACTIVE

## 2021-10-29 LAB — RPR: RPR Ser Ql: NONREACTIVE

## 2021-11-19 ENCOUNTER — Telehealth: Payer: Self-pay | Admitting: Obstetrics and Gynecology

## 2021-11-19 NOTE — Telephone Encounter (Signed)
Please let the patient know that her Pap from 06/20/21 returned with ASCUS, negative HPV. I don't have any prior pap history. Please find out when her prior pap was from and when she had the "abnormal pap", I would like a copy of that if possible. If she has only had normal paps for the last several times, she needs a repeat pap in 6/25.

## 2021-11-19 NOTE — Telephone Encounter (Signed)
Since we don't have access to her paps prior to last year and her reported history of being told she needs a biopsy, I would recommend she have a f/u pap in one year. Please put in one year recall and let her know.  CC: Marliss Coots, NP

## 2022-01-02 ENCOUNTER — Other Ambulatory Visit (HOSPITAL_COMMUNITY): Payer: Self-pay | Admitting: Obstetrics and Gynecology

## 2022-01-02 DIAGNOSIS — Z1231 Encounter for screening mammogram for malignant neoplasm of breast: Secondary | ICD-10-CM

## 2022-02-15 ENCOUNTER — Other Ambulatory Visit: Payer: Self-pay

## 2022-02-15 ENCOUNTER — Encounter (HOSPITAL_COMMUNITY): Payer: Self-pay

## 2022-02-15 ENCOUNTER — Inpatient Hospital Stay (HOSPITAL_COMMUNITY): Payer: Self-pay | Attending: Obstetrics and Gynecology | Admitting: *Deleted

## 2022-02-15 ENCOUNTER — Ambulatory Visit (HOSPITAL_COMMUNITY)
Admission: RE | Admit: 2022-02-15 | Discharge: 2022-02-15 | Disposition: A | Discharge status: Home / Self Care | Payer: Self-pay | Source: Ambulatory Visit | Attending: Obstetrics and Gynecology | Admitting: Obstetrics and Gynecology

## 2022-02-15 VITALS — BP 116/82 | Wt 141.1 lb

## 2022-02-15 DIAGNOSIS — Z1211 Encounter for screening for malignant neoplasm of colon: Secondary | ICD-10-CM

## 2022-02-15 DIAGNOSIS — Z1239 Encounter for other screening for malignant neoplasm of breast: Secondary | ICD-10-CM

## 2022-02-15 DIAGNOSIS — Z1231 Encounter for screening mammogram for malignant neoplasm of breast: Secondary | ICD-10-CM | POA: Insufficient documentation

## 2022-02-15 NOTE — Patient Instructions (Signed)
Explained breast self awareness with Sylvia Nguyen. Patient did not need a Pap smear today due to last Pap smear was 06/20/2021. Let her know that based on her last Pap smear result that her next Pap smear is due in 3 years. Referred patient to [place] for a screening mammogram. Appointment scheduled Friday, February 15, 2022 at 1130. Patient aware of appointment and will be there. Let patient know [facility] will follow up with her within the next couple weeks with results of her mammogram by letter or phone. [place] verbalized understanding.  Jeanclaude Wentworth, Arvil Chaco, RN 10:50 AM

## 2022-02-15 NOTE — Progress Notes (Signed)
[name] is a 51 y.o. female who presents to [facility] today with complaint of left diffuse sharp breast pains x 5 years that comes and goes. Patient rates the pain at a 7 out of 10. Patients previous mammogram 08/15/2020 was negative.   Pap Smear: Pap smear not completed today. Last Pap smear was 06/20/2021 at [facility] and was abnormal - ASCUS with negative HPV . Per patient has no history of an abnormal Pap smear prior to her most recent Pap smear. Last Pap smear result is available in Epic in care everywhere.   Physical exam: Breasts Breasts symmetrical. No skin abnormalities bilateral breasts. No nipple retraction bilateral breasts. No nipple discharge bilateral breasts. No lymphadenopathy. No lumps palpated bilateral breasts. No complaints of pain or tenderness on exam.      MS DIGITAL SCREENING TOMO BILATERAL  Result Date: 08/15/2020 CLINICAL DATA:  Screening. EXAM: DIGITAL SCREENING BILATERAL MAMMOGRAM WITH TOMO AND CAD COMPARISON:  Previous exam(s). ACR Breast Density Category c: The breast tissue is heterogeneously dense, which may obscure small masses. FINDINGS: There are no findings suspicious for malignancy. Images were processed with CAD. IMPRESSION: No mammographic evidence of malignancy. A result letter of this screening mammogram will be mailed directly to the patient. RECOMMENDATION: Screening mammogram in one year. (Code:SM-B-01Y) BI-RADS CATEGORY  1: Negative. Electronically Signed   By: Claudie Revering M.D.   On: 08/15/2020 16:43   MS DIGITAL SCREENING TOMO BILATERAL  Result Date: 03/18/2018 CLINICAL DATA:  Screening. EXAM: DIGITAL SCREENING BILATERAL MAMMOGRAM WITH TOMO AND CAD COMPARISON:  Previous exam(s). ACR Breast Density Category c: The breast tissue is heterogeneously dense, which may obscure small masses. FINDINGS: There are no findings suspicious for malignancy. Images were processed with CAD. IMPRESSION: No mammographic evidence of  malignancy. A result letter of this screening mammogram will be mailed directly to the patient. RECOMMENDATION: Screening mammogram in one year. (Code:SM-B-01Y) BI-RADS CATEGORY  1: Negative. Electronically Signed   By: Nolon Nations M.D.   On: 03/18/2018 10:10    Pelvic/Bimanual Pap is not indicated today per BCCCP guidelines.   Smoking History: Patient has never smoked.   Patient Navigation: Patient education provided. Access to services provided for patient through [employer] program. Spanish interpreter Delfino Lovett from [employer] provided.   Colorectal Cancer Screening: Per patient has had colonoscopy completed on 03/30/2020 at [facility]. FIT Test given to patient to complete.  No complaints today.    Breast and Cervical Cancer Risk Assessment: Patient does not have family history of breast cancer, known genetic mutations, or radiation treatment to the chest before age 28. Patient does not have history of cervical dysplasia, immunocompromised, or DES exposure in-utero.  Risk Assessment     Risk Scores       02/15/2022 08/15/2020   Last edited by: Demetrius Revel, LPN Rico Junker, RN   5-year risk: 0.7 % 0.7 %   Lifetime risk: 6 % 6.1 %            A: BCCCP exam without pap smear Complaint of left breast pain.  P: Referred patient to [place] for a screening mammogram. Appointment scheduled Friday, February 15, 2022 at 1130.  Loletta Parish, RN 02/15/2022 10:50 AM

## 2022-02-18 ENCOUNTER — Other Ambulatory Visit (HOSPITAL_COMMUNITY): Payer: Self-pay | Admitting: Obstetrics and Gynecology

## 2022-02-18 DIAGNOSIS — R928 Other abnormal and inconclusive findings on diagnostic imaging of breast: Secondary | ICD-10-CM

## 2022-03-04 ENCOUNTER — Ambulatory Visit (HOSPITAL_COMMUNITY)
Admission: RE | Admit: 2022-03-04 | Discharge: 2022-03-04 | Disposition: A | Discharge status: Home / Self Care | Payer: Self-pay | Source: Ambulatory Visit | Attending: Obstetrics and Gynecology | Admitting: Obstetrics and Gynecology

## 2022-03-04 ENCOUNTER — Other Ambulatory Visit: Payer: Self-pay

## 2022-03-04 DIAGNOSIS — R928 Other abnormal and inconclusive findings on diagnostic imaging of breast: Secondary | ICD-10-CM | POA: Insufficient documentation

## 2022-03-21 LAB — FECAL OCCULT BLOOD, IMMUNOCHEMICAL: Fecal Occult Bld: NEGATIVE

## 2022-03-28 ENCOUNTER — Telehealth: Payer: Self-pay

## 2022-03-28 NOTE — Telephone Encounter (Signed)
Via Rudene Anda, Spanish Interpreter Erling Cruz), Patient informed negative FIT test results. Patient verbalized understanding.  ?

## 2022-04-02 ENCOUNTER — Telehealth: Payer: Self-pay

## 2022-04-02 NOTE — Telephone Encounter (Signed)
Via Julie Sowell, Spanish Interpreter ([place]), Patient informed negative FIT test results. Patient verbalized understanding.  

## 2022-06-11 ENCOUNTER — Ambulatory Visit: Payer: Self-pay | Admitting: Physician Assistant

## 2022-06-11 ENCOUNTER — Encounter: Payer: Self-pay | Admitting: Physician Assistant

## 2022-06-11 VITALS — BP 110/70 | HR 66 | Temp 98.2°F | Ht 62.0 in | Wt 144.0 lb

## 2022-06-11 DIAGNOSIS — G8929 Other chronic pain: Secondary | ICD-10-CM

## 2022-06-11 DIAGNOSIS — Z789 Other specified health status: Secondary | ICD-10-CM

## 2022-06-11 DIAGNOSIS — K5900 Constipation, unspecified: Secondary | ICD-10-CM

## 2022-06-11 DIAGNOSIS — Z7689 Persons encountering health services in other specified circumstances: Secondary | ICD-10-CM

## 2022-06-11 NOTE — Progress Notes (Signed)
BP 110/70   Pulse 66   Temp 98.2 F (36.8 C)   Ht '5\' 2"'$  (1.575 m)   Wt 144 lb (65.3 kg)   SpO2 98%   BMI 26.34 kg/m    Subjective:    Patient ID: Sylvia Nguyen, female    DOB: 09-14-1971, 51 y.o.   MRN: [id number]  HPI: Sylvia Nguyen is a 51 y.o. female presenting on 06/11/2022 for New Patient (Initial Visit) (Pt previously went to a clinic in [place] her Provider's name was Verdis Frederickson. Pt c/o lower abd pain, pt states she was told she had an infection and kidney stones and is still having symptoms.)   HPI  Chief Complaint  Patient presents with   New Patient (Initial Visit)    Pt previously went to a clinic in [place] her Provider's name was Verdis Frederickson. Pt c/o lower abd pain, pt states she was told she had an infection and kidney stones and is still having symptoms.      She is getting her bp medication at a pharmacy that she doesn't remember where -   she says yes when asked if it [facility] but says no when shown a picture of the clinic.  She they says yes to [facility].- she says she last went in May.     She did not get a covid vaccination  She says she has lived in [place] for 24 year.  Her abdominal pain is her only problem.  She has had many appointments in Epic for abdominal pain.      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  She has split up with her husband several years ago.    She says the Abd pain is all the time.    She passes gas frequently.  She says she is constipated.   She is taking nothing for that.               Relevant past medical, surgical, family and social history reviewed and updated as indicated. Interim medical history since our last visit reviewed. Allergies and medications reviewed and updated.   Current Outpatient Medications:    amLODipine (NORVASC) 10 MG tablet, Take 1 tablet (10 mg total) by mouth daily., Disp: 30 tablet, Rfl: 0     Review of Systems  Per HPI  unless specifically indicated above     Objective:    BP 110/70   Pulse 66   Temp 98.2 F (36.8 C)   Ht '5\' 2"'$  (1.575 m)   Wt 144 lb (65.3 kg)   SpO2 98%   BMI 26.34 kg/m   Wt Readings from Last 3 Encounters:  06/11/22 144 lb (65.3 kg)  02/15/22 141 lb 1.6 oz (64 kg)  10/26/21 142 lb (64.4 kg)    Physical Exam Vitals reviewed.  Constitutional:      General: She is not in acute distress.    Appearance: She is well-developed. She is not ill-appearing.  HENT:     Head: Normocephalic and atraumatic.     Right Ear: Tympanic membrane, ear canal and external ear normal.     Left Ear: Tympanic membrane, ear canal and external ear normal.  Eyes:     Extraocular Movements: Extraocular movements intact.     Conjunctiva/sclera: Conjunctivae normal.     Pupils: Pupils are equal, round, and reactive to light.  Neck:     Thyroid: No thyromegaly.  Cardiovascular:  Rate and Rhythm: Normal rate and regular rhythm.  Pulmonary:     Effort: Pulmonary effort is normal.     Breath sounds: Normal breath sounds.  Abdominal:     General: Bowel sounds are normal. There is no distension.     Palpations: Abdomen is soft. There is no hepatomegaly, splenomegaly, mass or pulsatile mass.     Tenderness: There is no abdominal tenderness. There is no guarding or rebound.  Musculoskeletal:     Cervical back: Neck supple.     Right lower leg: No edema.     Left lower leg: No edema.  Lymphadenopathy:     Cervical: No cervical adenopathy.  Skin:    General: Skin is warm and dry.  Neurological:     Mental Status: She is alert and oriented to person, place, and time.     Motor: No weakness or tremor.     Gait: Gait is intact. Gait normal.     Deep Tendon Reflexes:     Reflex Scores:      Patellar reflexes are 2+ on the right side and 2+ on the left side. Psychiatric:        Attention and Perception: Attention normal.        Speech: Speech normal.        Behavior: Behavior normal. Behavior is  cooperative.            Assessment & Plan:   Encounter Diagnoses  Name Primary?   Encounter to establish care Yes   Chronic abdominal pain    Constipation, unspecified constipation type    Not proficient in [place] language       -After exam is finished and pt is at checkout, she remembers she has a card with her previous pcp on it. Records release is signed by pt and faxed to that office.   Will call in rx to [facility] when we get records from her previous office -pt is encouraged to use metamucil or similar daily and make sure to drink at least 4 or 5 bottles water/day -she has had extensive w/u for abdominal pain.   She say ob/gyn in November -   summary notes:      Treated for PID, STD testing, abd CT- 1.6 cm ovarian cyst, intermittent LLQ pain for years, constipation, nocturia, vag itching.  In 2020- [place]-  PAP- 03/2021- normal, colonoscopy 2021  -gyn dx cystocele and rectocele and discussed treatment options -pt to follow up here in 1 month to check improvement in constipation.   She is to contact office sooner prn

## 2022-06-11 NOTE — Patient Instructions (Signed)
Estreimiento, en adultos Constipation, Adult Estreimiento significa que una persona hace menos de tres deposiciones en una semana, tiene dificultades para defecar o las heces (deposiciones) son secas, duras o ms grandes de lo normal. La causa puede ser una afeccin subyacente. Puede empeorar con la edad si una persona toma ciertos medicamentos y no toma suficiente lquido. Siga estas instrucciones en su casa: Comida y bebida  Consuma alimentos con alto contenido de fibra, como frijoles, cereales integrales, y frutas y verduras frescas. Limite el consumo de alimentos que sean bajos en fibra y ricos en grasas y azcares procesados, como los fritos y los dulces. Estos incluyen patatas fritas, hamburguesas, galletas, dulces y refrescos. Beba suficiente lquido como para mantener la orina de color amarillo plido. Instrucciones generales Haga actividad fsica habitualmente o como se lo haya indicado el mdico. Intente practicar 150 minutos de actividad fsica moderada por semana. Vaya al bao siempre que sienta ganas de defecar. No se aguante las ganas. Use los medicamentos de venta libre y los recetados solamente como se lo haya indicado el mdico. Esto incluye suplementos de fibra. En el momento de hacer sus deposiciones: Respire profundamente mientras relaja la parte inferior del abdomen. Practique la relajacin del suelo plvico. Controle su afeccin para detectar cualquier cambio. Informe a su mdico acerca de cualquier cambio. Concurra a todas las visitas de seguimiento como se lo haya indicado el mdico. Esto es importante. Comunquese con un mdico si: Su dolor empeora. Tiene fiebre. No defeca despus de 4 das. Vomita. Baja de peso o no tiene apetito. Tiene sangrado por la abertura entre las nalgas (ano). Las heces son delgadas como un lpiz. Solicite ayuda de inmediato si: Tiene fiebre, y los sntomas empeoran repentinamente. Observa que se filtran heces o hay sangre en las  heces. Tiene el abdomen distendido. Siente un dolor intenso en el abdomen. Se siente mareado o se desmaya. Resumen Estreimiento significa que una persona hace menos de tres deposiciones en una semana, tiene dificultades para defecar o las heces (deposiciones) son secas, duras o ms grandes de lo normal. Consuma alimentos con alto contenido de fibra, como frijoles, cereales integrales, y frutas y verduras frescas. Beba suficiente lquido como para mantener la orina de color amarillo plido. Use los medicamentos de venta libre y los recetados solamente como se lo haya indicado el mdico. Esto incluye suplementos de fibra. Esta informacin no tiene como fin reemplazar el consejo del mdico. Asegrese de hacerle al mdico cualquier pregunta que tenga. Document Revised: 01/14/2020 Document Reviewed: 01/14/2020 Elsevier Patient Education  2023 Elsevier Inc.  

## 2022-07-09 ENCOUNTER — Encounter: Payer: Self-pay | Admitting: Physician Assistant

## 2022-07-09 ENCOUNTER — Ambulatory Visit: Payer: Self-pay | Admitting: Physician Assistant

## 2022-07-09 VITALS — BP 120/74 | HR 73 | Temp 98.2°F | Ht 62.0 in | Wt 142.9 lb

## 2022-07-09 DIAGNOSIS — R7989 Other specified abnormal findings of blood chemistry: Secondary | ICD-10-CM

## 2022-07-09 DIAGNOSIS — K5900 Constipation, unspecified: Secondary | ICD-10-CM

## 2022-07-09 DIAGNOSIS — Z789 Other specified health status: Secondary | ICD-10-CM

## 2022-07-09 DIAGNOSIS — Z13220 Encounter for screening for lipoid disorders: Secondary | ICD-10-CM

## 2022-07-09 DIAGNOSIS — N8111 Cystocele, midline: Secondary | ICD-10-CM

## 2022-07-09 DIAGNOSIS — I10 Essential (primary) hypertension: Secondary | ICD-10-CM

## 2022-07-09 DIAGNOSIS — N816 Rectocele: Secondary | ICD-10-CM

## 2022-07-09 MED ORDER — AMLODIPINE BESYLATE 10 MG PO TABS: 10.0000 mg | ORAL_TABLET | Freq: Every day | ORAL | 4 refills | Status: DC

## 2022-07-09 NOTE — Progress Notes (Signed)
 BP 120/74   Pulse 73   Temp 98.2 F (36.8 C)   Ht 5' 2" (1.575 m)   Wt 142 lb 14.4 oz (64.8 kg)   SpO2 98%   BMI 26.14 kg/m    Subjective:    Patient ID: Sylvia Nguyen, female    DOB: 10/12/1971, 51 y.o.   MRN: [id number]  HPI: Sylvia Nguyen is a 51 y.o. female presenting on 07/09/2022 for Abdominal Pain   HPI   Chief Complaint  Patient presents with   Abdominal Pain     Metamucil helped her constipation.  She is drinking plenty of water.   She sometimes still has abdominal pain but not all the time.    Pain mostly when it's hot or when she lifts something- pain radiates around from the back around to the front.   She has abd pain about every other day.   In the winter when she doesn't pick anything up she doesn't have any abdominal pain.     Relevant past medical, surgical, family and social history reviewed and updated as indicated. Interim medical history since our last visit reviewed. Allergies and medications reviewed and updated.   Current Outpatient Medications:    amLODipine (NORVASC) 10 MG tablet, Take 1 tablet (10 mg total) by mouth daily., Disp: 30 tablet, Rfl: 0   Psyllium (DAILY FIBER PO), Take by mouth as needed., Disp: , Rfl:     Review of Systems  Per HPI unless specifically indicated above     Objective:    BP 120/74   Pulse 73   Temp 98.2 F (36.8 C)   Ht 5' 2" (1.575 m)   Wt 142 lb 14.4 oz (64.8 kg)   SpO2 98%   BMI 26.14 kg/m   Wt Readings from Last 3 Encounters:  07/09/22 142 lb 14.4 oz (64.8 kg)  06/11/22 144 lb (65.3 kg)  02/15/22 141 lb 1.6 oz (64 kg)    Physical Exam Vitals reviewed.  Constitutional:      General: She is not in acute distress.    Appearance: She is well-developed. She is not toxic-appearing.  HENT:     Head: Normocephalic and atraumatic.  Cardiovascular:     Rate and Rhythm: Normal rate and regular rhythm.  Pulmonary:     Effort: Pulmonary effort is normal.     Breath sounds: Normal breath  sounds.  Abdominal:     General: Bowel sounds are normal.     Palpations: Abdomen is soft. There is no hepatomegaly, splenomegaly or mass.     Tenderness: There is no abdominal tenderness. There is no guarding or rebound.  Musculoskeletal:     Cervical back: Neck supple.     Thoracic back: Normal.     Lumbar back: No tenderness or bony tenderness. Normal range of motion. Negative right straight leg raise test and negative left straight leg raise test.     Right lower leg: No edema.     Left lower leg: No edema.  Lymphadenopathy:     Cervical: No cervical adenopathy.  Skin:    General: Skin is warm and dry.  Neurological:     Mental Status: She is alert and oriented to person, place, and time.  Psychiatric:        Behavior: Behavior normal.            Assessment & Plan:   Encounter Diagnoses  Name Primary?   Primary hypertension Yes   Elevated LFTs      Screening cholesterol level    Not proficient in English language    Constipation, unspecified constipation type    Cystocele, midline    Rectocele       HCM: -Mammogram was done earlier this year -Colonsocopy done 2021 -PAP 05/2021- ascus with negative HPV.  Pt had BCCCP appointment 01/2022 recommended repeat in 3 years.  -Check lipids-screening- told trig high sometime in the pase  Check LFTs= alk phose elevated  Discussed pelvic organ prolapse and pessary.  Pt  is interested if she could get financial assistance  Pt counseled on Back exercises, recommendations to use heat/ice prn, avoid over-exertion/too heavy lifiting  Pt is given application for [employer] financial assistance  Pt to follow up 2 months.  She is to contact office sooner prn

## 2022-07-09 NOTE — Patient Instructions (Signed)
Ejercicios para la espalda Back Exercises Los siguientes ejercicios fortalecen los msculos que dan soporte al tronco (torso) y a Counsellor. [place]ems, ayudan a mantener la flexibilidad de la zona lumbar. Hacer estos ejercicios puede ser de ayuda para evitar o Recruitment consultant. Si tiene dolor o [employer] espalda, intente hacer estos ejercicios 2 o 3 veces por da, o como se lo haya indicado el mdico. A medida que el dolor desaparece, hgalos una vez por da, pero aumente la cantidad de veces que repite los pasos para cada ejercicio (haga ms repeticiones). Para prevenir la recurrencia del dolor de espalda, contine haciendo estos ejercicios una vez al da o como se lo haya indicado el mdico. Haga los ejercicios exactamente como se lo haya indicado el mdico y gradelos como se lo hayan indicado. Es normal sentir un leve estiramiento, tirn, rigidez o molestia cuando haga estos ejercicios, pero debe detenerse de inmediato si siente un dolor repentino o si el dolor empeora. Ejercicios Rodilla al pecho Repita estos pasos 3 a 5 veces con cada pierna: Acustese boca arriba sobre una cama dura o sobre el suelo con las piernas extendidas. Lleve una rodilla al pecho. La otra pierna debe quedar extendida y en contacto con el suelo. Mantenga la rodilla contra el pecho al tomarse la rodilla o el muslo con ambas manos y Pension scheme manager. Tire de la rodilla hasta sentir una elongacin suave en la parte baja de la espalda o las nalgas. Mantenga la elongacin durante 10 a 30 segundos. Suelte y extienda la pierna lentamente.  Inclinacin de la pelvis Repita estos pasos 5 a 10 veces: Acustese boca arriba sobre una cama dura o sobre el suelo con las piernas extendidas. Flexione las rodillas de modo que apunten al techo y los pies queden apoyados en el suelo. Contraiga los msculos de la parte baja del abdomen para empujar la zona lumbar contra el suelo. Con este movimiento se inclinar la pelvis de modo que  el coxis apunte hacia el techo, en lugar de apuntar a los pies o al suelo. Contraiga suavemente y respire con normalidad mientras mantiene esta posicin durante 5 a 10 segundos.  El perro y el gato Repita estos pasos hasta que la zona lumbar se vuelva ms flexible: [place] manos y las rodillas sobre una cama firme o el suelo. Las manos deben estar alineadas con los hombros y las rodillas con las caderas. Puede colocarse almohadillas debajo de las rodillas para estar cmodo. Deje que la cabeza cuelgue hacia el pecho. Contraiga los msculos abdominales y baje el coxis en direccin al suelo de modo que la zona lumbar se arquee como el lomo de un [place]. Mantenga esta posicin durante 5 segundos. Lentamente, levante la cabeza, relaje los msculos abdominales y eleve el coxis de modo que apunte en direccin al techo para que la espalda forme un arco hundido como el lomo de un perro contento. Mantenga esta posicin durante 5 segundos.  Flexiones de English as a second language teacher pasos 5 a 10 veces: Acustese sobre el abdomen (boca abajo) en una cama firme o en el suelo. [place] manos cerca de la cabeza, separadas aproximadamente al ancho de los hombros. Con la espalda lo ms relajada posible y las caderas apoyadas en el suelo, extienda lentamente los brazos para levantar la mitad superior del cuerpo y Community education officer los hombros. No use los msculos de la espalda para elevar la parte superior del torso. [place]  lugar para estar ms cmodo. Mantenga esta posicin durante 5 segundos mientras mantiene la espalda relajada. Lentamente vuelva a la posicin horizontal.  Puentes Repita estos pasos 10 veces: Acustese boca arriba sobre una cama firme o sobre el suelo. Flexione las rodillas de modo que apunten al techo y los pies queden apoyados en el suelo. Los brazos deben estar paralelos a los costados del cuerpo, junto al cuerpo. Contraiga los glteos y despegue las  nalgas del suelo hasta que la cintura est casi a la misma altura que las rodillas. Debe sentir el trabajo muscular en las nalgas y la parte de atrs de los muslos. Si no siente el esfuerzo de [employer], aleje los pies 1 a 2 pulgadas (2.5 a 5 cm) de las nalgas. Mantenga esta posicin durante 3 a 5 segundos. Baje lentamente las caderas a la posicin inicial y relaje las nalgas por completo. Si este ejercicio le resulta muy fcil, intente realizarlo con los brazos cruzados [place]. Abdominales Repita estos pasos 5 a 10 veces: Acustese boca arriba sobre una cama dura o sobre el suelo con las piernas extendidas. Flexione las rodillas de modo que apunten al techo y los pies queden apoyados en el suelo. Cruce los [employer]. Baje levemente el mentn en direccin al pecho sin doblar el cuello. Contraiga los msculos abdominales y con lentitud eleve el torso lo suficiente como para Administrator los omplatos del suelo. No eleve el torso ms que eso, porque esto puede sobreexigir a la zona lumbar y no ayuda a Aeronautical engineer abdominales. Regrese lentamente a la posicin inicial.  Elevaciones de espalda Repita estos pasos 5 a 10 veces: Acustese sobre el abdomen (boca abajo) con los brazos a los costados del cuerpo y apoye la frente en el suelo. Contraiga los msculos de las piernas y las nalgas. Lentamente despegue el pecho del suelo mientras mantiene las caderas bien apoyadas en el suelo. Mantenga la nuca alineada con la curvatura de la espalda. Los ojos deben mirar al suelo. Mantenga esta posicin durante 3 a 5 segundos. Regrese lentamente a la posicin inicial.  Comunquese con un mdico si: El dolor o las molestias en la espalda se vuelven mucho ms intensos cuando hace un ejercicio. El dolor o las molestias en la espalda que [place], no se Runner, broadcasting/film/video en el trmino de las 2 horas posteriores a Therapist, art. Si tiene alguno de [employer], deje de  [employer] ejercicios de inmediato. No vuelva a hacer los ejercicios a menos que el mdico lo autorice. Solicite ayuda de inmediato si: Siente un dolor sbito e intenso en la espalda. Si esto ocurre, deje de [employer] ejercicios de inmediato. No vuelva a hacer los ejercicios a menos que el mdico lo autorice. Esta informacin no tiene Marine scientist el consejo del mdico. Asegrese de hacerle al mdico cualquier pregunta que tenga. Document Revised: 06/29/2021 Document Reviewed: 06/29/2021 Elsevier Patient Education  [place].

## 2022-07-12 ENCOUNTER — Other Ambulatory Visit (HOSPITAL_COMMUNITY)
Admission: RE | Admit: 2022-07-12 | Discharge: 2022-07-12 | Disposition: A | Discharge status: Home / Self Care | Payer: Self-pay | Source: Ambulatory Visit | Attending: Physician Assistant | Admitting: Physician Assistant

## 2022-07-12 DIAGNOSIS — Z13220 Encounter for screening for lipoid disorders: Secondary | ICD-10-CM | POA: Insufficient documentation

## 2022-07-12 DIAGNOSIS — I10 Essential (primary) hypertension: Secondary | ICD-10-CM | POA: Insufficient documentation

## 2022-07-12 DIAGNOSIS — R7989 Other specified abnormal findings of blood chemistry: Secondary | ICD-10-CM | POA: Insufficient documentation

## 2022-07-12 LAB — LIPID PANEL
Cholesterol: 205 mg/dL — ABNORMAL HIGH (ref 0–200)
HDL: 52 mg/dL (ref >40)
LDL Cholesterol: 137 mg/dL — ABNORMAL HIGH (ref 0–99)
Total CHOL/HDL Ratio: 3.9 RATIO
Triglycerides: 81 mg/dL (ref <150)
VLDL: 16 mg/dL (ref 0–40)

## 2022-07-12 LAB — COMPREHENSIVE METABOLIC PANEL
ALT: 24 U/L (ref 0–44)
AST: 25 U/L (ref 15–41)
Albumin: 4.3 g/dL (ref 3.5–5.0)
Alkaline Phosphatase: 99 U/L (ref 38–126)
Anion gap: 9 (ref 5–15)
BUN: 18 mg/dL (ref 6–20)
CO2: 25 mmol/L (ref 22–32)
Calcium: 8.9 mg/dL (ref 8.9–10.3)
Chloride: 106 mmol/L (ref 98–111)
Creatinine, Ser: 0.7 mg/dL (ref 0.44–1.00)
GFR, Estimated: >60 mL/min (ref >60)
Glucose, Bld: 92 mg/dL (ref 70–99)
Potassium: 2.8 mmol/L — ABNORMAL LOW (ref 3.5–5.1)
Sodium: 140 mmol/L (ref 135–145)
Total Bilirubin: 0.9 mg/dL (ref 0.3–1.2)
Total Protein: 7.5 g/dL (ref 6.5–8.1)

## 2022-07-15 ENCOUNTER — Other Ambulatory Visit: Payer: Self-pay | Admitting: Physician Assistant

## 2022-07-15 MED ORDER — POTASSIUM CHLORIDE CRYS ER 10 MEQ PO TBCR: 10.0000 meq | EXTENDED_RELEASE_TABLET | Freq: Two times a day (BID) | ORAL | 0 refills | Status: DC

## 2022-07-22 ENCOUNTER — Telehealth: Payer: Self-pay | Admitting: Physician Assistant

## 2022-07-22 ENCOUNTER — Other Ambulatory Visit: Payer: Self-pay | Admitting: Physician Assistant

## 2022-07-22 DIAGNOSIS — E876 Hypokalemia: Secondary | ICD-10-CM

## 2022-07-22 NOTE — Telephone Encounter (Signed)
Pt called wanting to know her Blood work results The Pt was told her potassium is 2.8. Pt needs to take potassium pills 1 tablet 2 times daily for 1 week. She need to repeat labs when pills are done.

## 2022-07-24 ENCOUNTER — Emergency Department (HOSPITAL_COMMUNITY): Payer: Self-pay

## 2022-07-24 ENCOUNTER — Telehealth: Payer: Self-pay | Admitting: Student

## 2022-07-24 ENCOUNTER — Emergency Department (HOSPITAL_COMMUNITY)
Admission: EM | Admit: 2022-07-24 | Discharge: 2022-07-24 | Disposition: A | Discharge status: Home / Self Care | Payer: Self-pay | Attending: Emergency Medicine | Admitting: Emergency Medicine

## 2022-07-24 ENCOUNTER — Encounter (HOSPITAL_COMMUNITY): Payer: Self-pay | Admitting: *Deleted

## 2022-07-24 ENCOUNTER — Other Ambulatory Visit: Payer: Self-pay

## 2022-07-24 DIAGNOSIS — Z79899 Other long term (current) drug therapy: Secondary | ICD-10-CM | POA: Insufficient documentation

## 2022-07-24 DIAGNOSIS — M25522 Pain in left elbow: Secondary | ICD-10-CM | POA: Insufficient documentation

## 2022-07-24 DIAGNOSIS — M79632 Pain in left forearm: Secondary | ICD-10-CM | POA: Insufficient documentation

## 2022-07-24 DIAGNOSIS — R110 Nausea: Secondary | ICD-10-CM | POA: Insufficient documentation

## 2022-07-24 DIAGNOSIS — I10 Essential (primary) hypertension: Secondary | ICD-10-CM | POA: Insufficient documentation

## 2022-07-24 DIAGNOSIS — E876 Hypokalemia: Secondary | ICD-10-CM | POA: Insufficient documentation

## 2022-07-24 DIAGNOSIS — R0789 Other chest pain: Secondary | ICD-10-CM | POA: Insufficient documentation

## 2022-07-24 DIAGNOSIS — R0602 Shortness of breath: Secondary | ICD-10-CM | POA: Insufficient documentation

## 2022-07-24 LAB — BASIC METABOLIC PANEL
Anion gap: 7 (ref 5–15)
BUN: 12 mg/dL (ref 6–20)
CO2: 29 mmol/L (ref 22–32)
Calcium: 8.8 mg/dL — ABNORMAL LOW (ref 8.9–10.3)
Chloride: 103 mmol/L (ref 98–111)
Creatinine, Ser: 0.67 mg/dL (ref 0.44–1.00)
GFR, Estimated: >60 mL/min (ref >60)
Glucose, Bld: 129 mg/dL — ABNORMAL HIGH (ref 70–99)
Potassium: 2.8 mmol/L — ABNORMAL LOW (ref 3.5–5.1)
Sodium: 139 mmol/L (ref 135–145)

## 2022-07-24 LAB — TROPONIN I (HIGH SENSITIVITY)
Troponin I (High Sensitivity): <2 ng/L (ref <18)
Troponin I (High Sensitivity): <2 ng/L (ref <18)

## 2022-07-24 LAB — CBC
HCT: 39.3 % (ref 36.0–46.0)
Hemoglobin: 13.2 g/dL (ref 12.0–15.0)
MCH: 29.1 pg (ref 26.0–34.0)
MCHC: 33.6 g/dL (ref 30.0–36.0)
MCV: 86.8 fL (ref 80.0–100.0)
Platelets: 267 10*3/uL (ref 150–400)
RBC: 4.53 MIL/uL (ref 3.87–5.11)
RDW: 13 % (ref 11.5–15.5)
WBC: 7.6 10*3/uL (ref 4.0–10.5)
nRBC: 0 % (ref 0.0–0.2)

## 2022-07-24 LAB — POC URINE PREG, ED: Preg Test, Ur: NEGATIVE

## 2022-07-24 MED ORDER — POTASSIUM CHLORIDE CRYS ER 20 MEQ PO TBCR: 20.0000 meq | EXTENDED_RELEASE_TABLET | Freq: Two times a day (BID) | ORAL | 0 refills | Status: DC

## 2022-07-24 MED ORDER — POTASSIUM CHLORIDE CRYS ER 20 MEQ PO TBCR
40.0000 meq | EXTENDED_RELEASE_TABLET | Freq: Once | ORAL | Status: AC
  Administered 2022-07-24: 40 meq via ORAL
  Filled 2022-07-24: qty 2

## 2022-07-24 NOTE — Telephone Encounter (Signed)
LPN called pt to f/u. Pt was advised on 07-22-22 to take potassium to help increase her K+ blood level. Pt states she has done that and finished taking K+ about 2 days ago. Pt was advised to go back to [facility] to recheck K+. Pt verbalized understanding.  While on the phone with the patient, pt reported sharp cp on L side chest with pain radiating to her L arm. Pt states she has had cp on and off since 07-09-22, but the last few days have been more consistent.  Pt report cp lasting all day on 07-22-22 with some difficulty breathing.  CP again last night (07-23-22) and again today. Pt states she has not taken any OTC meds to help with the pain, but has only rubbed her chest and drank hot teas.  Due to pt's complaints and most recent potassium blood level of 2.8 LPN urged pt to seek care at [facility]. Pt verbalized understanding.

## 2022-07-24 NOTE — ED Triage Notes (Signed)
Pt c/o chest pain x 3 days; pt states [facility] and [facility] did blood work and she was told her potassium level is low  Pt has had some nausea when pain occurs; pt had some sob x 3 days ago but today only pain with pain radiating to left arm

## 2022-07-24 NOTE — ED Provider Notes (Signed)
[facility] EMERGENCY DEPARTMENT Provider Note   CSN: [id number] Arrival date & time: 07/24/22  1629     History  Chief Complaint  Patient presents with   Chest Pain    Sylvia Nguyen is a 51 y.o. female.  The history is provided by the patient. The history is limited by a language barrier. A language interpreter was used.  Chest Pain Associated symptoms: nausea and shortness of breath   Associated symptoms: no abdominal pain, no cough, no dizziness, no fever, no numbness, no vomiting and no weakness       Sylvia Nguyen is a 51 y.o. female with past medical history of hypertension, hyperlipidemia and GERD who presents to the Emergency Department complaining of mid to left chest pain x3 days.  Describes pain as sharp and associated with pain to her left elbow and forearm area.  No known injury.  States that her chest pain is relieved with massage.  She had some shortness of breath at onset, but not currently.  Pain has been associated with nausea but no vomiting.  No diaphoresis.  She states that she was seen by her PCP and blood work showed that her potassium level was low.  She was started on oral potassium which she has completed.  She also is experiencing some intermittent pain and cramping of her legs and feet.  No edema.  She denies any history of heart disease and she is a non-smoker.  Home Medications Prior to Admission medications   Medication Sig Start Date End Date Taking? Authorizing Provider  amLODipine (NORVASC) 10 MG tablet Take 1 tablet (10 mg total) by mouth daily. Tome una tableta por boca diaria 07/09/22   Soyla Dryer, PA-C  potassium chloride (KLOR-CON M) 10 MEQ tablet Take 1 tablet (10 mEq total) by mouth 2 (two) times daily. Tome una tableta por boca dos veces diarias 07/15/22   Soyla Dryer, PA-C  Psyllium (DAILY FIBER PO) Take by mouth as needed.    [provider]      Allergies    Patient has no known allergies.    Review of Systems    Review of Systems  Constitutional:  Negative for appetite change, chills and fever.  Respiratory:  Positive for shortness of breath. Negative for cough.   Cardiovascular:  Positive for chest pain. Negative for leg swelling.  Gastrointestinal:  Positive for nausea. Negative for abdominal pain, diarrhea and vomiting.  Genitourinary:  Negative for difficulty urinating and dysuria.  Neurological:  Negative for dizziness, syncope, weakness, light-headedness and numbness.    Physical Exam Updated Vital Signs BP (!) 153/86 (BP Location: Right Arm)   Pulse 67   Temp 98 F (36.7 C) (Oral)   Resp 19   Ht '5\' 2"'$  (1.575 m)   Wt 64.8 kg   SpO2 97%   BMI 26.14 kg/m  Physical Exam Vitals and nursing note reviewed.  Constitutional:      General: She is not in acute distress.    Appearance: Normal appearance. She is not ill-appearing or toxic-appearing.  Cardiovascular:     Rate and Rhythm: Normal rate and regular rhythm.     Pulses: Normal pulses.     Heart sounds: No murmur heard. Pulmonary:     Effort: Pulmonary effort is normal. No respiratory distress.     Breath sounds: Normal breath sounds. No wheezing.  Chest:     Chest wall: Tenderness (Left-sided chest wall tenderness that improved palpation) present.  Abdominal:  Palpations: Abdomen is soft.     Tenderness: There is no abdominal tenderness.  Musculoskeletal:     Cervical back: Normal range of motion.     Right lower leg: No edema.     Left lower leg: No edema.  Skin:    General: Skin is warm.     Capillary Refill: Capillary refill takes less than 2 seconds.     Findings: No rash.  Neurological:     General: No focal deficit present.     Mental Status: She is alert.     Sensory: No sensory deficit.     Motor: No weakness.     ED Results / Procedures / Treatments   Labs (all labs ordered are listed, but only abnormal results are displayed) Labs Reviewed  BASIC METABOLIC PANEL - Abnormal; Notable for the following  components:      Result Value   Potassium 2.8 (*)    Glucose, Bld 129 (*)    Calcium 8.8 (*)    All other components within normal limits  CBC  POC URINE PREG, ED  TROPONIN I (HIGH SENSITIVITY)  TROPONIN I (HIGH SENSITIVITY)    EKG EKG Interpretation  Date/Time:  Wednesday July 24 2022 16:46:09 EDT Ventricular Rate:  70 PR Interval:  162 QRS Duration: 82 QT Interval:  404 QTC Calculation: 436 R Axis:   92 Text Interpretation: Normal sinus rhythm Rightward axis Borderline ECG When compared with ECG of 27-Mar-2021 06:00, PREVIOUS ECG IS PRESENT since last tracing no significant change Confirmed by Daleen Bo [phone]) on 07/24/2022 6:21:47 PM  Radiology DG Chest 2 View  Result Date: 07/24/2022 CLINICAL DATA:  Chest pain. EXAM: CHEST - 2 VIEW COMPARISON:  03/27/21 FINDINGS: Heart size and mediastinal contours are unremarkable. There is no pleural effusion or edema identified. No airspace opacities. Visualized osseous structures are unremarkable. IMPRESSION: No active cardiopulmonary abnormalities. Electronically Signed   By: Kerby Moors M.D.   On: 07/24/2022 17:11    Procedures Procedures    Medications Ordered in ED Medications  potassium chloride SA (KLOR-CON M) CR tablet 40 mEq (has no administration in time range)    ED Course/ Medical Decision Making/ A&P                           Medical Decision Making Patient here for evaluation of 3-day history of chest pain.  Pain has been associated with some nausea and lower extremity cramping.  She has some pain localized to her left elbow and forearm area.  Seen at her PCPs office and advise her potassium level was low.  No history of cardiac disease.  Short of breath of onset of pain but not currently.  Describes her chest pain as sharp  On exam, patient is well-appearing nontoxic.  Vital signs are reassuring.  She denies any shortness of breath.  She has some mild mid to left chest pain currently.  Differential of course  would include but not limited to ACS, PE, pericarditis musculoskeletal pain and dissection.  Dissection and PE felt to be less likely given patient's lack of dyspnea, tachycardia or tachypnea.  Dissection also felt less likely given patient's persistent but limited degree of pain and that pain is alleviated with massage  Amount and/or Complexity of Data Reviewed Labs: ordered.    Details: Labs interpreted by me, no evidence of leukocytosis.  Hemoglobin unremarkable.  Initial troponin reassuring at less than 2.  Her chemistries does show some hypokalemia  with potassium of 2.8.  This is similar to her potassium level 2 weeks ago.  Kidney function is unremarkable.  Delta Trop unchanged Radiology: ordered.    Details: Chest x-ray shows no active cardiopulmonary process. ECG/medicine tests: ordered.    Details: EKG shows normal sinus rhythm Discussion of management or test interpretation with external provider(s): On recheck, patient resting comfortably.  She has been given oral potassium here.  Will provide prescription for additional potassium.  Chest pain improves with massage, source unclear but felt to be musculoskeletal.  Feel that she is appropriate for discharge home, she is agreeable to close outpatient follow-up with PCP to have her potassium level rechecked in 1 week.  Strict return precautions were given.  Risk Prescription drug management.           Final Clinical Impression(s) / ED Diagnoses Final diagnoses:  Atypical chest pain  Hypokalemia    Rx / DC Orders ED Discharge Orders     None         Kem Parkinson, PA-C 07/27/22 1418    Daleen Bo, MD 07/28/22 [phone]

## 2022-07-24 NOTE — Discharge Instructions (Signed)
Your potassium level is low.  Please take the prescription potassium as directed.  You will need to have your potassium level rechecked in 1 week.  Your primary care clinic can do this for you.  Return to the emergency room for any new or worsening symptoms.

## 2022-07-30 ENCOUNTER — Telehealth: Payer: Self-pay | Admitting: Student

## 2022-07-30 NOTE — Telephone Encounter (Signed)
LPN called to remind her to go to [facility] to recheck her K+ after she finishes the rx given to her by [facility].  Pt did not answer. LPN left vm for call back.

## 2022-07-30 NOTE — Telephone Encounter (Signed)
Pt called back and was notified. Pt verbalized understanding.

## 2022-08-05 ENCOUNTER — Other Ambulatory Visit (HOSPITAL_COMMUNITY)
Admission: RE | Admit: 2022-08-05 | Discharge: 2022-08-05 | Disposition: A | Discharge status: Home / Self Care | Payer: Self-pay | Source: Ambulatory Visit | Attending: Physician Assistant | Admitting: Physician Assistant

## 2022-08-05 DIAGNOSIS — E876 Hypokalemia: Secondary | ICD-10-CM | POA: Insufficient documentation

## 2022-08-05 LAB — POTASSIUM: Potassium: 3.1 mmol/L — ABNORMAL LOW (ref 3.5–5.1)

## 2022-08-07 ENCOUNTER — Other Ambulatory Visit: Payer: Self-pay | Admitting: Physician Assistant

## 2022-08-07 MED ORDER — POTASSIUM CHLORIDE CRYS ER 20 MEQ PO TBCR: 20.0000 meq | EXTENDED_RELEASE_TABLET | Freq: Every day | ORAL | 0 refills | Status: DC

## 2022-08-13 ENCOUNTER — Other Ambulatory Visit: Payer: Self-pay | Admitting: Physician Assistant

## 2022-08-13 DIAGNOSIS — Z131 Encounter for screening for diabetes mellitus: Secondary | ICD-10-CM

## 2022-08-13 DIAGNOSIS — R7989 Other specified abnormal findings of blood chemistry: Secondary | ICD-10-CM

## 2022-08-13 DIAGNOSIS — E876 Hypokalemia: Secondary | ICD-10-CM

## 2022-08-13 DIAGNOSIS — I10 Essential (primary) hypertension: Secondary | ICD-10-CM

## 2022-08-13 DIAGNOSIS — R7309 Other abnormal glucose: Secondary | ICD-10-CM

## 2022-09-04 ENCOUNTER — Other Ambulatory Visit (HOSPITAL_COMMUNITY)
Admission: RE | Admit: 2022-09-04 | Discharge: 2022-09-04 | Disposition: A | Discharge status: Home / Self Care | Payer: Self-pay | Source: Ambulatory Visit | Attending: Physician Assistant | Admitting: Physician Assistant

## 2022-09-04 DIAGNOSIS — R7989 Other specified abnormal findings of blood chemistry: Secondary | ICD-10-CM | POA: Insufficient documentation

## 2022-09-04 DIAGNOSIS — R7309 Other abnormal glucose: Secondary | ICD-10-CM | POA: Insufficient documentation

## 2022-09-04 DIAGNOSIS — I10 Essential (primary) hypertension: Secondary | ICD-10-CM | POA: Insufficient documentation

## 2022-09-04 DIAGNOSIS — E876 Hypokalemia: Secondary | ICD-10-CM | POA: Insufficient documentation

## 2022-09-04 DIAGNOSIS — Z131 Encounter for screening for diabetes mellitus: Secondary | ICD-10-CM | POA: Insufficient documentation

## 2022-09-04 LAB — COMPREHENSIVE METABOLIC PANEL
ALT: 29 U/L (ref 0–44)
AST: 30 U/L (ref 15–41)
Albumin: 4.2 g/dL (ref 3.5–5.0)
Alkaline Phosphatase: 120 U/L (ref 38–126)
Anion gap: 10 (ref 5–15)
BUN: 14 mg/dL (ref 6–20)
CO2: 25 mmol/L (ref 22–32)
Calcium: 9.1 mg/dL (ref 8.9–10.3)
Chloride: 104 mmol/L (ref 98–111)
Creatinine, Ser: 0.67 mg/dL (ref 0.44–1.00)
GFR, Estimated: >60 mL/min (ref >60)
Glucose, Bld: 96 mg/dL (ref 70–99)
Potassium: 3.1 mmol/L — ABNORMAL LOW (ref 3.5–5.1)
Sodium: 139 mmol/L (ref 135–145)
Total Bilirubin: 0.9 mg/dL (ref 0.3–1.2)
Total Protein: 7.8 g/dL (ref 6.5–8.1)

## 2022-09-04 LAB — HEMOGLOBIN A1C
Hgb A1c MFr Bld: 5.6 % (ref 4.8–5.6)
Mean Plasma Glucose: 114.02 mg/dL

## 2022-09-10 ENCOUNTER — Ambulatory Visit: Payer: Self-pay | Admitting: Physician Assistant

## 2022-09-16 ENCOUNTER — Ambulatory Visit: Payer: Self-pay | Admitting: Physician Assistant

## 2022-09-16 ENCOUNTER — Encounter: Payer: Self-pay | Admitting: Physician Assistant

## 2022-09-16 VITALS — BP 127/78 | HR 67 | Temp 98.1°F

## 2022-09-16 DIAGNOSIS — Z789 Other specified health status: Secondary | ICD-10-CM

## 2022-09-16 DIAGNOSIS — E876 Hypokalemia: Secondary | ICD-10-CM

## 2022-09-16 DIAGNOSIS — I10 Essential (primary) hypertension: Secondary | ICD-10-CM

## 2022-09-16 MED ORDER — POTASSIUM CHLORIDE CRYS ER 20 MEQ PO TBCR: 20.0000 meq | EXTENDED_RELEASE_TABLET | Freq: Every day | ORAL | 0 refills | Status: DC

## 2022-09-16 MED ORDER — AMLODIPINE BESYLATE 10 MG PO TABS: 10.0000 mg | ORAL_TABLET | Freq: Every day | ORAL | 1 refills | Status: DC

## 2022-09-16 NOTE — Patient Instructions (Addendum)
Colesterol elevado High Cholesterol  El colesterol elevado es una afeccin que se caracteriza porque la sangre tiene niveles altos de una sustancia blanca, cerosa y parecida a la grasa (colesterol). El hgado fabrica todo el colesterol que el organismo necesita. El organismo humano necesita pequeas cantidades de colesterol para formar las clulas. El exceso de colesterol se obtiene de los alimentos que se consumen. La sangre transporta el colesterol desde el hgado al resto del cuerpo. Si tiene el colesterol elevado, pueden acumularse depsitos (placas) en las paredes de las arterias. Las arterias son los vasos sanguneos que transportan la sangre desde el corazn al resto del cuerpo. Las placas causan que las arterias se estrechen y se endurezcan. Las placas de colesterol aumentan el riesgo de sufrir un infarto de miocardio y un accidente cerebrovascular. Trabaje con el mdico para mantener las concentraciones de colesterol en un rango saludable. Qu incrementa el riesgo? Los siguientes factores pueden hacer que sea ms propenso a desarrollar esta afeccin: Consumir alimentos con alto contenido de grasa animal (grasa saturada) o colesterol. Tener sobrepeso. No hacer suficiente ejercicio fsico. Tener antecedentes familiares de colesterol alto (hipercolesterolemia familiar). Consumir productos con tabaco. Tener diabetes. Cules son los signos o sntomas? En la mayora de los casos, el colesterol alto no causa ningn sntoma por lo general. En los casos graves, los niveles muy altos de colesterol pueden causar: Protuberancias de grasa debajo de la piel (xantomas). Un anillo blanco o gris alrededor del centro negro (pupila) del ojo. Cmo se diagnostica? Esta afeccin se puede diagnosticar en funcin de los resultados de un anlisis de sangre. Si es mayor de 20 aos, es posible que el mdico le controle el nivel de colesterol cada 4 a 6 aos. Los controles pueden ser ms frecuentes si tiene el  colesterol elevado u otros factores de riesgo de enfermedad cardaca. En el anlisis de sangre de colesterol, se determina lo siguiente: El colesterol "malo", o colesterol LDL. Este es el principal tipo de colesterol que causa enfermedades cardacas. El nivel recomendado es de menos de 100 mg/dl (2.59 mmol/l). El colesterol "bueno", o colesterol HDL. El HDL ayuda a proteger contra la enfermedad cardaca porque limpia las arterias y arrastra el LDL al hgado para que lo procese. El nivel recomendado de HDL es de 60 mg/dl (1.55 mmol/l) o ms. Triglicridos. Estos son grasas que el organismo puede almacenar o quemar como fuente de energa. El nivel recomendado es de menos de 150 mg/dl (1.69 mmol/l). Colesterol total. Mide la cantidad total de colesterol en la sangre, e incluye el colesterol LDL, el colesterol HDL y los triglicridos. El nivel recomendado es de menos de 200 mg/dl (5.17 mmol/l). Cmo se trata? El tratamiento para el colesterol alto comienza con cambios en el estilo de vida, tales como dieta y ejercicio. Cambios en la dieta. Es posible que le indiquen que consuma alimentos con ms fibra y menos grasas saturadas o azcar agregada. Cambios en el estilo de vida. Estos pueden incluir hacer actividad fsica con regularidad, mantener un peso saludable y dejar de consumir productos con tabaco. Medicamentos. Estos se administran cuando los cambios en la dieta y en el estilo de vida no han sido eficaces. Es posible que le receten medicamentos llamados estatinas para bajar sus niveles de colesterol. Siga estas instrucciones en su casa: Comida y bebida  Siga una dieta saludable y equilibrada. Esta dieta incluye lo siguiente: Porciones diarias de frutas y verduras frescas, congeladas o enlatadas. Porciones diarias de alimentos integrales con alto contenido de fibra. Alimentos   con bajo contenido de grasas saturadas y grasas trans. Estos incluyen carne de ave y pescado sin piel, cortes de carne magros  y productos lcteos descremados. Una variedad de pescado, especialmente pescado graso que contenga cidos grasos omega 3. Propngase comer pescado al menos dos veces por semana. Evite los alimentos y las bebidas que tengan azcar agregada. Use mtodos de coccin saludables, como asar, grillar, hervir, hornear, escalfar, cocer al vapor y saltear. No fra los alimentos excepto para saltearlos. Si bebe alcohol: Limite la cantidad que bebe a lo siguiente: De 0 a 1 medida por da para las mujeres que no estn embarazadas. De 0 a 2 medidas por da para los hombres. Sepa cunta cantidad de alcohol hay en las bebidas. En los [place], una medida equivale a una botella de cerveza de 12 oz (355 ml), un vaso de vino de 5 oz (148 ml) o un vaso de una bebida alcohlica de alta graduacin de 1 oz (44 ml). Estilo de vida  Haga ejercicio con regularidad. Trate de hacer un total de 150 minutos de actividad fsica por semana. Aumente la cantidad de ejercicio fsico que realiza mediante actividades como la jardinera, salir a caminar o usar las escaleras. No consuma ningn producto que contenga nicotina o tabaco. Estos productos incluyen cigarrillos, tabaco para mascar y aparatos de vapeo, como los cigarrillos electrnicos. Si necesita ayuda para dejar de consumir estos productos, consulte al mdico. Indicaciones generales Use los medicamentos de venta libre y los recetados solamente como se lo haya indicado el mdico. Concurra a todas las visitas de seguimiento. Esto es importante. Dnde buscar ms informacin American Heart Association (Asociacin Estadounidense del Corazn): www.heart.org National Heart, Lung, and Blood Institute (Instituto Nacional del Corazn, los Pulmones y la Sangre): www.nhlbi.nih.gov Comunquese con un mdico si: Tiene dificultad para alcanzar o mantener una alimentacin sana y un peso saludable. Est por comenzar un programa de ejercicios. No puede dejar de fumar. Solicite ayuda  de inmediato si: Siente dolor en el pecho. Tiene dificultad para respirar. Tiene molestias o dolor en la mandbula, el cuello, la espalda, los hombros o los brazos. Tiene sntomas de un accidente cerebrovascular. "BE FAST" es una manera fcil de recordar los principales signos de advertencia de un accidente cerebrovascular: B: Balance (equilibrio). Los signos son mareos, dificultad repentina para caminar o prdida del equilibrio. E - Eyes (ojos). Los signos son problemas para ver o un cambio repentino en la visin. F: Face (rostro). Los signos son debilidad repentina o entumecimiento del rostro, o el rostro o el prpado que se caen hacia un lado. A: Arms (brazos). Los signos son debilidad o entumecimiento en un brazo. Esto sucede de repente y generalmente en un lado del cuerpo. S: Speech (habla). Los signos son dificultad para hablar, hablar arrastrando las palabras o dificultad para comprender lo que las personas dicen. T: Time (tiempo). Es tiempo de llamar al servicio de emergencias. Anote la hora a la que comenzaron los sntomas. Presenta otros signos de un accidente cerebrovascular, como los siguientes: Dolor de cabeza sbito e intenso que no tiene causa aparente. Nuseas o vmitos. Convulsiones. Estos sntomas pueden representar un problema grave que constituye una emergencia. No espere a ver si los sntomas desaparecen. Solicite atencin mdica de inmediato. Comunquese con el servicio de emergencias de su localidad (911 en los [place]). No conduzca por sus propios medios hasta el hospital. Resumen Las placas de colesterol aumentan el riesgo de sufrir un infarto de miocardio y un accidente cerebrovascular. Trabaje con el mdico   para mantener las concentraciones de colesterol en un rango saludable. Siga una dieta saludable y equilibrada, haga ejercicio con regularidad y mantenga un peso saludable. No consuma ningn producto que contenga nicotina o tabaco. Estos productos incluyen  cigarrillos, tabaco para mascar y aparatos de vapeo, como los cigarrillos electrnicos. Obtenga ayuda de inmediato si tiene cualquier sntoma de un accidente cerebrovascular. Esta informacin no tiene como fin reemplazar el consejo del mdico. Asegrese de hacerle al mdico cualquier pregunta que tenga. Document Revised: 02/26/2021 Document Reviewed: 02/26/2021 Elsevier Patient Education  2023 [employer] Inc.  

## 2022-09-16 NOTE — Progress Notes (Signed)
BP 127/78   Pulse 67   Temp 98.1 F (36.7 C)   SpO2 96%    Subjective:    Patient ID: Sylvia Nguyen, female    DOB: 1971-04-18, 51 y.o.   MRN: [id number]  HPI: Sylvia Nguyen is a 51 y.o. female presenting on 09/16/2022 for Hypertension   HPI  Chief Complaint  Patient presents with   Hypertension      She didn't bring her meds with her.    She ran out yesterday, maybe, and says her son threw the bottles in the trash.   Her potassium rx completed-      Relevant past medical, surgical, family and social history reviewed and updated as indicated. Interim medical history since our last visit reviewed. Allergies and medications reviewed and updated.     Current Outpatient Medications:    amLODipine (NORVASC) 10 MG tablet, Take 1 tablet (10 mg total) by mouth daily. Tome una tableta por boca diaria, Disp: 30 tablet, Rfl: 4   fluconazole (DIFLUCAN) 200 MG tablet, Take 200 mg by mouth once. (Patient not taking: Reported on 07/24/2022), Disp: , Rfl:    potassium chloride SA (KLOR-CON M) 20 MEQ tablet, Take 1 tablet (20 mEq total) by mouth daily., Disp: 14 tablet, Rfl: 0   Psyllium (DAILY FIBER PO), Take by mouth as needed., Disp: , Rfl:    tamsulosin (FLOMAX) 0.4 MG CAPS capsule, Take 0.4 mg by mouth daily. (Patient not taking: Reported on 07/24/2022), Disp: , Rfl:    Review of Systems  Per HPI unless specifically indicated above     Objective:    BP 127/78   Pulse 67   Temp 98.1 F (36.7 C)   SpO2 96%   Wt Readings from Last 3 Encounters:  07/24/22 142 lb 14.4 oz (64.8 kg)  07/09/22 142 lb 14.4 oz (64.8 kg)  06/11/22 144 lb (65.3 kg)    Physical Exam Constitutional:      General: She is not in acute distress.    Appearance: She is not ill-appearing or toxic-appearing.  HENT:     Head: Normocephalic and atraumatic.  Pulmonary:     Effort: Pulmonary effort is normal. No respiratory distress.  Neurological:     Mental Status: She is alert and oriented to  person, place, and time.  Psychiatric:        Behavior: Behavior normal.     Results for orders placed or performed during the hospital encounter of 09/04/22  Comprehensive metabolic panel  Result Value Ref Range   Sodium 139 135 - 145 mmol/L   Potassium 3.1 (L) 3.5 - 5.1 mmol/L   Chloride 104 98 - 111 mmol/L   CO2 25 22 - 32 mmol/L   Glucose, Bld 96 70 - 99 mg/dL   BUN 14 6 - 20 mg/dL   Creatinine, Ser 0.67 0.44 - 1.00 mg/dL   Calcium 9.1 8.9 - 10.3 mg/dL   Total Protein 7.8 6.5 - 8.1 g/dL   Albumin 4.2 3.5 - 5.0 g/dL   AST 30 15 - 41 U/L   ALT 29 0 - 44 U/L   Alkaline Phosphatase 120 38 - 126 U/L   Total Bilirubin 0.9 0.3 - 1.2 mg/dL   GFR, Estimated >60 >60 mL/min   Anion gap 10 5 - 15  Hemoglobin A1c  Result Value Ref Range   Hgb A1c MFr Bld 5.6 4.8 - 5.6 %   Mean Plasma Glucose 114.02 mg/dL      Assessment &  Plan:   Encounter Diagnoses  Name Primary?   Primary hypertension Yes   Hypokalemia    Not proficient in English language      -reviewed labs with pt -rx K+ -follow up 4 week with recheck K+ before appointment.

## 2022-10-11 ENCOUNTER — Other Ambulatory Visit (HOSPITAL_COMMUNITY)
Admission: RE | Admit: 2022-10-11 | Discharge: 2022-10-11 | Disposition: A | Discharge status: Home / Self Care | Payer: Self-pay | Source: Ambulatory Visit | Attending: Physician Assistant | Admitting: Physician Assistant

## 2022-10-11 DIAGNOSIS — E876 Hypokalemia: Secondary | ICD-10-CM

## 2022-10-11 LAB — POTASSIUM: Potassium: 3.6 mmol/L (ref 3.5–5.1)

## 2022-10-14 ENCOUNTER — Ambulatory Visit: Payer: Self-pay | Admitting: Physician Assistant

## 2022-10-16 ENCOUNTER — Encounter: Payer: Self-pay | Admitting: Physician Assistant

## 2022-10-16 ENCOUNTER — Ambulatory Visit: Payer: Self-pay | Admitting: Physician Assistant

## 2022-10-16 VITALS — BP 110/68 | HR 70 | Temp 98.2°F | Ht 62.0 in | Wt 141.9 lb

## 2022-10-16 DIAGNOSIS — I10 Essential (primary) hypertension: Secondary | ICD-10-CM

## 2022-10-16 DIAGNOSIS — E876 Hypokalemia: Secondary | ICD-10-CM

## 2022-10-16 DIAGNOSIS — Z789 Other specified health status: Secondary | ICD-10-CM

## 2022-10-16 DIAGNOSIS — M79601 Pain in right arm: Secondary | ICD-10-CM

## 2022-10-16 MED ORDER — AMLODIPINE BESYLATE 10 MG PO TABS: 10.0000 mg | ORAL_TABLET | Freq: Every day | ORAL | 1 refills | Status: DC

## 2022-10-16 MED ORDER — POTASSIUM CHLORIDE CRYS ER 20 MEQ PO TBCR: 20.0000 meq | EXTENDED_RELEASE_TABLET | Freq: Every day | ORAL | 1 refills | Status: DC

## 2022-10-16 NOTE — Patient Instructions (Addendum)
1-blood drawn next Wednesday, 1 November 2- [employer] financial assistance application  ----------------------------   Electroneuromiografa Electromyoneurogram La electroneuromiografa es un estudio que se realiza para evaluar el funcionamiento de los msculos y de los nervios. Este procedimiento incluye el uso combinado de una electromiografa (EMG) y un estudio de conduccin nerviosa (ECN). La EMG se utiliza para [employer] y los nervios que controlan esos msculos. El ECN, tambin llamado electroneurografa, determina si los nervios conducen bien la electricidad. Estos procedimientos deben hacerse juntos para determinar si los msculos y los nervios estn sanos. Si los [employer] pruebas son Engineer, manufacturing systems, esto puede indicar una enfermedad o una lesin, como una enfermedad neuromuscular o dao nervioso perifrico. Informe al mdico acerca de lo siguiente: Cualquier alergia que tenga. Todos los [employer] [place], incluidos vitaminas, hierbas, gotas oftlmicas, cremas y medicamentos de venta libre. Cualquier problema de la sangre que tenga. Cirugas a las que se haya sometido. Cualquier afeccin mdica que tenga. Cules son los riesgos? En general, se trata de un procedimiento seguro. Sin embargo, pueden ocurrir complicaciones, por ejemplo: Sangrado o moretones. Infecciones donde se insertaron los electrodos. Qu ocurre antes de esta prueba? [place] todos los medicamentos que le recetan habitualmente antes de Optometrist esta prueba. No deje de tomar anticoagulantes a menos que se lo indique su mdico. Instrucciones generales El mdico puede pedirle que caliente la extremidad que le revisarn con agua tibia, una compresa caliente o envolvindola con Josefine Class. No use lociones ni cremas el mismo da que Education officer, community. Qu ocurre durante la prueba? Para la EMG  El mdico le pedir que permanezca en una posicin para poder acceder al [employer] se estudiar. Usted estar sentado o acostado. Tambin pueden usar un medicamento para adormecer la zona (anestesia local) y Personnel officer piel. Le insertarn una aguja muy delgada que tiene un electrodo en el [place], de a un msculo a la vez. Normalmente, se evalan varios msculos durante un nico estudio. Se colocar otro electrodo sobre la piel cerca del msculo. El mdico le pedir que siga permaneciendo quieto. Los electrodos Therapist, sports actividad elctrica de los msculos. Puede verla en un monitor o escucharla en la sala. Despus de estudiar los msculos en reposo, el mdico le pedir que los contraiga o los flexione. Los electrodos Therapist, sports actividad elctrica de los msculos. El mdico retirar los electrodos y la aguja con electrodo una vez finalizado el procedimiento. El procedimiento puede variar segn el mdico y el hospital. Para el estudio de conduccin nerviosa  Se colocar sobre la piel, junto al msculo que se estudia, un electrodo que registra la actividad de los nervios (electrodo de Control and instrumentation engineer). Cerca del electrodo de Control and instrumentation engineer, se colocar un electrodo que se [place] como referencia (electrodo de Biomedical engineer). Se aplicar una pasta o un gel sobre la piel entre el electrodo de Control and instrumentation engineer y el de Biomedical engineer. Se estimular el nervio con un choque suave. Los electrodos [employer] velocidad de los nervios y la fuerza de la [place]. El mdico retirar los electrodos y el gel una vez finalizado el procedimiento. El procedimiento puede variar segn el mdico y el hospital. Qu puedo esperar despus de la prueba? Es su responsabilidad retirar los [employer] de la prueba. Consulte al mdico o pregunte en el departamento donde se realiza la prueba cundo estarn [employer]. El mdico puede: Administrarle analgsicos. Controlar los lugares de insercin para asegurarse de que el sangrado se [place]. Usted debera estar en condiciones de conducir  hacia y desde  la prueba. Las [employer] pueden persistir durante algunas horas despus de la prueba, pero deberan mejorar al da siguiente. Comunquese con un mdico si: Tiene hinchazn, enrojecimiento o secrecin en cualquiera de los lugares de insercin. Resumen La electroneuromiografa es un estudio que se realiza para evaluar el funcionamiento de los msculos y de los nervios. Si los [employer] pruebas son Engineer, manufacturing systems, esto puede indicar una enfermedad o una lesin. Es un procedimiento Set designer. Sin embargo, pueden ocurrir problemas, como sangrado e infeccin. El mdico [employer] pruebas para completar este procedimiento. Una de ellas verifica los msculos (EMG) y la otra verifica los nervios (ECN). Es su responsabilidad retirar los [employer] de la prueba. Consulte al mdico o pregunte en el departamento donde se realiza la prueba cundo estarn [employer]. Esta informacin no tiene Marine scientist el consejo del mdico. Asegrese de hacerle al mdico cualquier pregunta que tenga. Document Revised: 08/22/2021 Document Reviewed: 08/22/2021 Elsevier Patient Education  [place].

## 2022-10-16 NOTE — Progress Notes (Unsigned)
BP 110/68   Pulse 70   Temp 98.2 F (36.8 C)   Ht '5\' 2"'$  (1.575 m)   Wt 141 lb 14.4 oz (64.4 kg)   SpO2 97%   BMI 25.95 kg/m    Subjective:    Patient ID: Sylvia Nguyen, female    DOB: 1971/12/06, 51 y.o.   MRN: [id number]  HPI: Sylvia Nguyen is a 51 y.o. female presenting on 10/16/2022 for Hypertension and Follow-up (potassium)   HPI  Chief Complaint  Patient presents with   Hypertension   Follow-up    potassium     She didn't pick up her amlodipine after her last appointment .  She says [facility] said no.  She did take the potassium.     She says she took her bp med last 8 days ago.     She has 2 empty bottle with her today that are from months ago.  She says she feels good but both her hads feel numb.  She says that Started 20 days ago and it has been numb constantly since that time.   She says it not really pain but more a tingle   She denies taking any supplements.   She has lived in the [place] for over 20 years She doesn't work except she cooks on the weekends.   She only eats food, no dirt or other non-food items.  She has had No diarrhea or emesis    K+ low 07/11/2019.  Last normal K+ was 06/16/17.  She finished her K+ rx just a few days ago-  Friday (about 5 days ago)     Relevant past medical, surgical, family and social history reviewed and updated as indicated. Interim medical history since our last visit reviewed. Allergies and medications reviewed and updated.     Current Outpatient Medications:    amLODipine (NORVASC) 10 MG tablet, Take 1 tablet (10 mg total) by mouth daily. Tome una tableta por boca diaria (Patient not taking: Reported on 10/16/2022), Disp: 30 tablet, Rfl: 1   fluconazole (DIFLUCAN) 200 MG tablet, Take 200 mg by mouth once. (Patient not taking: Reported on 07/24/2022), Disp: , Rfl:    potassium chloride SA (KLOR-CON M) 20 MEQ tablet, Take 1 tablet (20 mEq total) by mouth daily. (Patient not taking: Reported on 10/16/2022), Disp:  30 tablet, Rfl: 0   Psyllium (DAILY FIBER PO), Take by mouth as needed. (Patient not taking: Reported on 10/16/2022), Disp: , Rfl:    tamsulosin (FLOMAX) 0.4 MG CAPS capsule, Take 0.4 mg by mouth daily. (Patient not taking: Reported on 07/24/2022), Disp: , Rfl:      Review of Systems  Per HPI unless specifically indicated above     Objective:    BP 110/68   Pulse 70   Temp 98.2 F (36.8 C)   Ht '5\' 2"'$  (1.575 m)   Wt 141 lb 14.4 oz (64.4 kg)   SpO2 97%   BMI 25.95 kg/m   Wt Readings from Last 3 Encounters:  10/16/22 141 lb 14.4 oz (64.4 kg)  07/24/22 142 lb 14.4 oz (64.8 kg)  07/09/22 142 lb 14.4 oz (64.8 kg)    Physical Exam Vitals reviewed.  Constitutional:      General: She is not in acute distress.    Appearance: She is well-developed. She is not ill-appearing.  HENT:     Head: Normocephalic and atraumatic.  Cardiovascular:     Rate and Rhythm: Normal rate and regular rhythm.  Pulmonary:  Effort: Pulmonary effort is normal.     Breath sounds: Normal breath sounds.  Abdominal:     General: Bowel sounds are normal.     Palpations: Abdomen is soft. There is no mass.     Tenderness: There is no abdominal tenderness.  Musculoskeletal:     Right wrist: No swelling, deformity or tenderness. Normal range of motion. Normal pulse.     Left wrist: No swelling, deformity or tenderness. Normal range of motion. Normal pulse.     Right hand: Normal.     Left hand: Normal.     Cervical back: Neck supple.     Right lower leg: No edema.     Left lower leg: No edema.  Lymphadenopathy:     Cervical: No cervical adenopathy.  Skin:    General: Skin is warm and dry.  Neurological:     Mental Status: She is alert and oriented to person, place, and time.     Motor: No weakness or tremor.  Psychiatric:        Behavior: Behavior normal.     Results for orders placed or performed during the hospital encounter of 10/11/22  Potassium  Result Value Ref Range   Potassium 3.6 3.5  - 5.1 mmol/L      Assessment & Plan:    Encounter Diagnoses  Name Primary?   Primary hypertension Yes   Hypokalemia    Not proficient in English language    Paresthesia and pain of both upper extremities      HTN Pt to continue amlodipine   hypokalemia -24hour urine K+ is not performed at [facility] so not ordered (due to no insurance).   -EKG done 07/25/22 was unremarkable -with next labdraw, will check Mg and TSH.  Pt instructed labs on 10/23/22 -pt Will continue 93mq K+   Hand paresthesias -get Emg testing.  hand ?eval carpal tunnel-  .   uninsured -pt is given Cafa/application for [employer] financial assistance  Pt will be scheduled to follow up in office in one month.  She is to contact office sooner prn worsening or new symptoms

## 2022-10-17 ENCOUNTER — Other Ambulatory Visit: Payer: Self-pay | Admitting: Physician Assistant

## 2022-10-29 ENCOUNTER — Telehealth: Payer: Self-pay | Admitting: Student

## 2022-10-29 NOTE — Telephone Encounter (Signed)
LPN to remind pt she was suppose to get blood work on 10-23-22. Pt is to get this donce ASAP. LPN to also ask pt about CAFA and schedule EMG.  LPN called and lvm asking pt to call back to discuss this.

## 2022-10-30 ENCOUNTER — Encounter: Payer: Self-pay | Admitting: Student

## 2022-10-30 ENCOUNTER — Other Ambulatory Visit (HOSPITAL_COMMUNITY)
Admission: RE | Admit: 2022-10-30 | Discharge: 2022-10-30 | Disposition: A | Discharge status: Home / Self Care | Payer: Self-pay | Source: Ambulatory Visit | Attending: Physician Assistant | Admitting: Physician Assistant

## 2022-10-30 DIAGNOSIS — I10 Essential (primary) hypertension: Secondary | ICD-10-CM | POA: Insufficient documentation

## 2022-10-30 DIAGNOSIS — E876 Hypokalemia: Secondary | ICD-10-CM | POA: Insufficient documentation

## 2022-10-30 LAB — MAGNESIUM: Magnesium: 2.3 mg/dL (ref 1.7–2.4)

## 2022-10-30 LAB — TSH: TSH: 2.844 u[IU]/mL (ref 0.350–4.500)

## 2022-10-30 NOTE — Telephone Encounter (Signed)
error 

## 2022-10-30 NOTE — Telephone Encounter (Signed)
LPN called pt again today (10-30-22) and notified pt that she is overdue for her labwork. Pt verbalized understanding and states states she will go now.  Pt states she has not submitted her CAFA and will come by the office for another copy.  Pt states she would like to wait until she is approved for her CAFA for her EMG to be scheduled.

## 2022-10-31 LAB — RENAL FUNCTION PANEL
Albumin: 4 g/dL (ref 3.5–5.0)
Anion gap: 9 (ref 5–15)
BUN: 15 mg/dL (ref 6–20)
CO2: 25 mmol/L (ref 22–32)
Calcium: 9.2 mg/dL (ref 8.9–10.3)
Chloride: 106 mmol/L (ref 98–111)
Creatinine, Ser: 0.6 mg/dL (ref 0.44–1.00)
GFR, Estimated: >60 mL/min (ref >60)
Glucose, Bld: 89 mg/dL (ref 70–99)
Phosphorus: 3.5 mg/dL (ref 2.5–4.6)
Potassium: 3.1 mmol/L — ABNORMAL LOW (ref 3.5–5.1)
Sodium: 140 mmol/L (ref 135–145)

## 2022-11-19 ENCOUNTER — Ambulatory Visit: Payer: Self-pay | Admitting: Physician Assistant

## 2022-11-19 ENCOUNTER — Encounter: Payer: Self-pay | Admitting: Physician Assistant

## 2022-11-19 VITALS — BP 120/70 | HR 63 | Temp 98.0°F | Ht 62.0 in | Wt 142.5 lb

## 2022-11-19 DIAGNOSIS — I10 Essential (primary) hypertension: Secondary | ICD-10-CM

## 2022-11-19 DIAGNOSIS — M79601 Pain in right arm: Secondary | ICD-10-CM

## 2022-11-19 DIAGNOSIS — Z789 Other specified health status: Secondary | ICD-10-CM

## 2022-11-19 DIAGNOSIS — T148XXA Other injury of unspecified body region, initial encounter: Secondary | ICD-10-CM

## 2022-11-19 DIAGNOSIS — E876 Hypokalemia: Secondary | ICD-10-CM

## 2022-11-19 MED ORDER — AMLODIPINE BESYLATE 10 MG PO TABS: 10.0000 mg | ORAL_TABLET | Freq: Every day | ORAL | 1 refills | Status: DC

## 2022-11-19 MED ORDER — POTASSIUM CHLORIDE CRYS ER 20 MEQ PO TBCR
EXTENDED_RELEASE_TABLET | ORAL | 1 refills | Status: DC
Start: 2022-11-19 — End: 2022-12-31

## 2022-11-19 NOTE — Progress Notes (Unsigned)
BP 120/70   Pulse 63   Temp 98 F (36.7 C)   Ht '5\' 2"'$  (1.575 m)   Wt 142 lb 8 oz (64.6 kg)   SpO2 99%   BMI 26.06 kg/m    Subjective:    Patient ID: Sylvia Nguyen, female    DOB: 1971-11-20, 51 y.o.   MRN: [id number]  HPI: Sylvia Nguyen is a 51 y.o. female presenting on 11/19/2022 for Follow-up   HPI  Pt is 40yoF with appointment to follow up HTN, hypokalemia and paresthesia of both hands.   Pt says wanted to wait to scheduled EMG until she got cafa/financial assistance before she made the appointment.    Labs/renal panel results did not come to me when done on 11/9-  K+ still low.   She is unsure if she  submitted her cafa/financial assistance application   She ran out of her  meds yesterday.  She has refills on the bottles but didn't get them.   A dog bit her leg - her dog- last weeek- and she has a place on back of her leg- the dog is a pit mix.   She says she doesn't know why it bit her.  She says it has it rabies vaccination.  She doesn't remember when last she got a tetanus shot.  Pt says the area itches.  She has been using antibiotic cream which does not help.    Relevant past medical, surgical, family and social history reviewed and updated as indicated. Interim medical history since our last visit reviewed. Allergies and medications reviewed and updated.   Current Outpatient Medications:    amLODipine (NORVASC) 10 MG tablet, Take 1 tablet (10 mg total) by mouth daily. Tome una tableta por boca diaria, Disp: 30 tablet, Rfl: 1   potassium chloride SA (KLOR-CON M) 20 MEQ tablet, 1 po bid x 1 week then 1 po qd.   Tome una tableta por boca dos veces diarias despues Tome una tableta por boca diaria, Disp: 30 tablet, Rfl: 1     Review of Systems  Per HPI unless specifically indicated above     Objective:    BP 120/70   Pulse 63   Temp 98 F (36.7 C)   Ht '5\' 2"'$  (1.575 m)   Wt 142 lb 8 oz (64.6 kg)   SpO2 99%   BMI 26.06 kg/m   Wt Readings from  Last 3 Encounters:  11/19/22 142 lb 8 oz (64.6 kg)  10/16/22 141 lb 14.4 oz (64.4 kg)  07/24/22 142 lb 14.4 oz (64.8 kg)    Physical Exam Vitals reviewed.  Constitutional:      General: She is not in acute distress.    Appearance: She is well-developed. She is not toxic-appearing.  HENT:     Head: Normocephalic and atraumatic.  Cardiovascular:     Rate and Rhythm: Normal rate and regular rhythm.  Pulmonary:     Effort: Pulmonary effort is normal.     Breath sounds: Normal breath sounds.  Abdominal:     General: Bowel sounds are normal.     Palpations: Abdomen is soft. There is no mass.     Tenderness: There is no abdominal tenderness.  Musculoskeletal:     Cervical back: Neck supple.     Right lower leg: No edema.     Left lower leg: No edema.       Legs:     Comments: Posterior left thigh is a small papule that  is c/w healing puncture wound.  There is no redness, drainage, tenderness.     Lymphadenopathy:     Cervical: No cervical adenopathy.  Skin:    General: Skin is warm and dry.  Neurological:     Mental Status: She is alert and oriented to person, place, and time.  Psychiatric:        Behavior: Behavior normal.     Results for orders placed or performed during the hospital encounter of 10/30/22  Magnesium  Result Value Ref Range   Magnesium 2.3 1.7 - 2.4 mg/dL  TSH  Result Value Ref Range   TSH 2.844 0.350 - 4.500 uIU/mL      Assessment & Plan:     Encounter Diagnoses  Name Primary?   Primary hypertension Yes   Hypokalemia    Not proficient in English language    Paresthesia and pain of both upper extremities    Animal bite       hypokalemia K+ bid x 1 week then qd  HTN Continue Amlodipine qd  peresthesias -[facility] FA- 06/06/22-06/07/23 -Cafa - not active  yet.  Checked with care connect and the application was just submitted this week -will try to get test scheduled at [facility] if possible  Dog bite -no treatment is needed at wound site.  She  can use OTC cortisone cream if needed for itching -Animal control was notified.  Officer came to the office and spoke with pt (since a translator was available here) -Pt given rx for tdap to get [facility] or local pharmacy  Pt is scheduled to follow up in 6 weeks.  She is to contact office sooner prn

## 2022-12-11 ENCOUNTER — Other Ambulatory Visit: Payer: Self-pay | Admitting: Physician Assistant

## 2022-12-11 DIAGNOSIS — E876 Hypokalemia: Secondary | ICD-10-CM

## 2022-12-11 DIAGNOSIS — I10 Essential (primary) hypertension: Secondary | ICD-10-CM

## 2022-12-27 ENCOUNTER — Other Ambulatory Visit (HOSPITAL_COMMUNITY)
Admission: RE | Admit: 2022-12-27 | Discharge: 2022-12-27 | Disposition: A | Discharge status: Home / Self Care | Payer: Self-pay | Source: Ambulatory Visit | Attending: Physician Assistant | Admitting: Physician Assistant

## 2022-12-27 DIAGNOSIS — E876 Hypokalemia: Secondary | ICD-10-CM | POA: Insufficient documentation

## 2022-12-27 DIAGNOSIS — I10 Essential (primary) hypertension: Secondary | ICD-10-CM | POA: Insufficient documentation

## 2022-12-27 LAB — RENAL FUNCTION PANEL
Albumin: 4 g/dL (ref 3.5–5.0)
Anion gap: 10 (ref 5–15)
BUN: 17 mg/dL (ref 6–20)
CO2: 27 mmol/L (ref 22–32)
Calcium: 8.8 mg/dL — ABNORMAL LOW (ref 8.9–10.3)
Chloride: 101 mmol/L (ref 98–111)
Creatinine, Ser: 0.65 mg/dL (ref 0.44–1.00)
GFR, Estimated: >60 mL/min (ref >60)
Glucose, Bld: 96 mg/dL (ref 70–99)
Phosphorus: 4.5 mg/dL (ref 2.5–4.6)
Potassium: 2.5 mmol/L — CL (ref 3.5–5.1)
Sodium: 138 mmol/L (ref 135–145)

## 2022-12-31 ENCOUNTER — Encounter: Payer: Self-pay | Admitting: Physician Assistant

## 2022-12-31 ENCOUNTER — Ambulatory Visit: Payer: Self-pay | Admitting: Physician Assistant

## 2022-12-31 VITALS — BP 126/82 | HR 67 | Temp 98.0°F

## 2022-12-31 DIAGNOSIS — I10 Essential (primary) hypertension: Secondary | ICD-10-CM

## 2022-12-31 DIAGNOSIS — Z789 Other specified health status: Secondary | ICD-10-CM

## 2022-12-31 DIAGNOSIS — E876 Hypokalemia: Secondary | ICD-10-CM

## 2022-12-31 DIAGNOSIS — M79601 Pain in right arm: Secondary | ICD-10-CM

## 2022-12-31 MED ORDER — POTASSIUM CHLORIDE CRYS ER 20 MEQ PO TBCR: EXTENDED_RELEASE_TABLET | ORAL | 1 refills | Status: DC

## 2022-12-31 MED ORDER — AMLODIPINE BESYLATE 10 MG PO TABS: 10.0000 mg | ORAL_TABLET | Freq: Every day | ORAL | 1 refills | Status: DC

## 2022-12-31 NOTE — Progress Notes (Unsigned)
BP 126/82   Pulse 67   Temp 98 F (36.7 C)   SpO2 98%    Subjective:    Patient ID: Sylvia Nguyen, female    DOB: Aug 17, 1971, 53 y.o.   MRN: [id number]  HPI: Sylvia Nguyen is a 52 y.o. female presenting on 12/31/2022 for No chief complaint on file.   HPI  Pt is 89yoF who is in today for follow up htn and hypokalemia and paresthesia.   Pt was referred for EMG at her OV 11/19/22 but this test has not yet been scheduled as she doesn't yet have [employer] financial assistance and efforts have been made to get the test done through the [facility] system.  She went to ER for parestesia on 12/14/22 and the ER referred her to neurologist.   She says she doesn't yet have appointment with neurologist.   Pt has had hypokalemia and was prescribed supplemental K+.  She ran out of her medication and did not get it refilled.   After a call from the lab reporting K+ of 2.6, pt was called and told to go to ER.  She went and received replenishment and another rx for K+.    Her K+ has been low at least since 2020.   She did have some low readings prior to that as well.       Relevant past medical, surgical, family and social history reviewed and updated as indicated. Interim medical history since our last visit reviewed. Allergies and medications reviewed and updated.    Current Outpatient Medications:    amLODipine (NORVASC) 10 MG tablet, Take 1 tablet (10 mg total) by mouth daily. Tome una tableta por boca diaria, Disp: 30 tablet, Rfl: 1   potassium chloride SA (KLOR-CON M) 20 MEQ tablet, 1 po bid x 1 week then 1 po qd.   Tome una tableta por boca dos veces diarias despues Tome una tableta por boca diaria, Disp: 30 tablet, Rfl: 1     Review of Systems  Per HPI unless specifically indicated above     Objective:    BP 126/82   Pulse 67   Temp 98 F (36.7 C)   SpO2 98%   Wt Readings from Last 3 Encounters:  11/19/22 142 lb 8 oz (64.6 kg)  10/16/22 141 lb 14.4 oz (64.4 kg)  07/24/22  142 lb 14.4 oz (64.8 kg)    Physical Exam Constitutional:      General: She is not in acute distress.    Appearance: She is not ill-appearing or toxic-appearing.  HENT:     Head: Normocephalic and atraumatic.  Cardiovascular:     Rate and Rhythm: Normal rate and regular rhythm.  Pulmonary:     Effort: Pulmonary effort is normal. No respiratory distress.     Breath sounds: Normal breath sounds. No wheezing.  Musculoskeletal:     Right lower leg: No edema.     Left lower leg: No edema.  Neurological:     Mental Status: She is alert and oriented to person, place, and time.  Psychiatric:        Attention and Perception: Attention normal.        Speech: Speech normal.        Behavior: Behavior normal. Behavior is cooperative.           Assessment & Plan:   Encounter Diagnoses  Name Primary?   Primary hypertension Yes   Hypokalemia    Not proficient in [place] language  Paresthesia and pain of both upper extremities      Pt to continue her K+ and amlodipine.   She is counseled at length about the dangers of running out of her K+ including lowering her K+ which can be fatal.   Will Recheck K+ next week- mon or tues.  She is to follow up in office 6 weeks.  Will try to contact [facility] neurology for status of her referral sent through ER.  Pt is to contact office for any issues.

## 2023-01-01 ENCOUNTER — Encounter: Payer: Self-pay | Admitting: Physician Assistant

## 2023-01-01 NOTE — Progress Notes (Addendum)
LPN called [facility] Neurology [phone]) to f/u on referral entered by [facility] on 12-14-22 for "Paresthesia of upper and lower extremities of both sides". [facility] states that referral was only entered as a neurology referral and not directed to a specific location as to why the referral had not been worked on. [facility] staff member states she will have it directed to their office to review the notes, then to contact pt. [facility] staff member informs LPN that [facility] Neurology in general is booked out until [place], 2024.  LPN called pt and left vm asking pt to call back to inform her of this and to discuss the option of applying for CAFA in hopes of getting scheduled sooner than Aug/Sept 2024 through [facility].

## 2023-01-01 NOTE — Progress Notes (Signed)
Pt called back. Pt understands [facility] Neurology is booked and is in agreement in seeing if [facility]rology has a sooner available appt. Pt states she has already applied for CAFA and is waiting to hear back from that. Pt was advised to contact [facility] when she is approved to place referral. Pt verbalized understanding.

## 2023-01-07 ENCOUNTER — Other Ambulatory Visit (HOSPITAL_COMMUNITY)
Admission: RE | Admit: 2023-01-07 | Discharge: 2023-01-07 | Disposition: A | Discharge status: Home / Self Care | Payer: Self-pay | Source: Ambulatory Visit | Attending: Physician Assistant | Admitting: Physician Assistant

## 2023-01-07 DIAGNOSIS — I10 Essential (primary) hypertension: Secondary | ICD-10-CM | POA: Insufficient documentation

## 2023-01-07 DIAGNOSIS — E876 Hypokalemia: Secondary | ICD-10-CM | POA: Insufficient documentation

## 2023-01-07 LAB — RENAL FUNCTION PANEL
Albumin: 3.9 g/dL (ref 3.5–5.0)
Anion gap: 8 (ref 5–15)
BUN: 14 mg/dL (ref 6–20)
CO2: 26 mmol/L (ref 22–32)
Calcium: 8.7 mg/dL — ABNORMAL LOW (ref 8.9–10.3)
Chloride: 101 mmol/L (ref 98–111)
Creatinine, Ser: 0.64 mg/dL (ref 0.44–1.00)
GFR, Estimated: >60 mL/min (ref >60)
Glucose, Bld: 92 mg/dL (ref 70–99)
Phosphorus: 4 mg/dL (ref 2.5–4.6)
Potassium: 3.2 mmol/L — ABNORMAL LOW (ref 3.5–5.1)
Sodium: 135 mmol/L (ref 135–145)

## 2023-01-08 ENCOUNTER — Other Ambulatory Visit: Payer: Self-pay | Admitting: Physician Assistant

## 2023-01-08 DIAGNOSIS — E876 Hypokalemia: Secondary | ICD-10-CM

## 2023-02-11 ENCOUNTER — Encounter: Payer: Self-pay | Admitting: Physician Assistant

## 2023-02-11 ENCOUNTER — Ambulatory Visit: Payer: Self-pay | Admitting: Physician Assistant

## 2023-02-11 VITALS — BP 126/80 | HR 68 | Temp 98.2°F | Ht 62.0 in | Wt 147.8 lb

## 2023-02-11 DIAGNOSIS — Z789 Other specified health status: Secondary | ICD-10-CM

## 2023-02-11 DIAGNOSIS — I10 Essential (primary) hypertension: Secondary | ICD-10-CM

## 2023-02-11 DIAGNOSIS — E876 Hypokalemia: Secondary | ICD-10-CM

## 2023-02-11 DIAGNOSIS — M79601 Pain in right arm: Secondary | ICD-10-CM

## 2023-02-11 DIAGNOSIS — N200 Calculus of kidney: Secondary | ICD-10-CM

## 2023-02-11 MED ORDER — POTASSIUM CHLORIDE CRYS ER 20 MEQ PO TBCR: EXTENDED_RELEASE_TABLET | ORAL | 1 refills | Status: DC

## 2023-02-11 MED ORDER — AMLODIPINE BESYLATE 10 MG PO TABS: 10.0000 mg | ORAL_TABLET | Freq: Every day | ORAL | 1 refills | Status: DC

## 2023-02-11 NOTE — Progress Notes (Signed)
BP 126/80   Pulse 68   Temp 98.2 F (36.8 C)   Ht 5' 2"$  (1.575 m)   Wt 147 lb 12 oz (67 kg)   SpO2 99%   BMI 27.02 kg/m    Subjective:    Patient ID: Sylvia Nguyen, female    DOB: [birth year], 52 y.o.   MRN: [id number]  HPI: Sylvia Nguyen is a 52 y.o. female presenting on 02/11/2023 for Follow-up   HPI  Pt is 59yoF who is in for routine follow up.   She was seen last month and neurology referral was discussed.    She was referred fro paresthesias.   Pt went to ER 01/17/23 for flank pain and says she was told she has kidney stones.  Pt went to ER for same in June 2023.   She says sometimes it hurts and at other times not so much.     Relevant past medical, surgical, family and social history reviewed and updated as indicated. Interim medical history since our last visit reviewed. Allergies and medications reviewed and updated.    Current Outpatient Medications:    amLODipine (NORVASC) 10 MG tablet, Take 1 tablet (10 mg total) by mouth daily. Tome una tableta por boca diaria, Disp: 30 tablet, Rfl: 1   potassium chloride SA (KLOR-CON M) 20 MEQ tablet, 1 po bid x 1 week then 1 po qd.   Tome una tableta por boca dos veces diarias despues Tome una tableta por boca diaria, Disp: 30 tablet, Rfl: 1     Review of Systems  Per HPI unless specifically indicated above     Objective:    BP 126/80   Pulse 68   Temp 98.2 F (36.8 C)   Ht 5' 2"$  (1.575 m)   Wt 147 lb 12 oz (67 kg)   SpO2 99%   BMI 27.02 kg/m   Wt Readings from Last 3 Encounters:  02/11/23 147 lb 12 oz (67 kg)  11/19/22 142 lb 8 oz (64.6 kg)  10/16/22 141 lb 14.4 oz (64.4 kg)    Physical Exam Vitals reviewed.  Constitutional:      General: She is not in acute distress.    Appearance: She is well-developed. She is not toxic-appearing.  HENT:     Head: Normocephalic and atraumatic.  Cardiovascular:     Rate and Rhythm: Normal rate and regular rhythm.  Pulmonary:     Effort: Pulmonary effort is  normal.     Breath sounds: Normal breath sounds.  Abdominal:     General: Bowel sounds are normal.     Palpations: Abdomen is soft. There is no mass.     Tenderness: There is no abdominal tenderness.  Musculoskeletal:     Cervical back: Neck supple.     Right lower leg: No edema.     Left lower leg: No edema.  Lymphadenopathy:     Cervical: No cervical adenopathy.  Skin:    General: Skin is warm and dry.  Neurological:     Mental Status: She is alert and oriented to person, place, and time.  Psychiatric:        Behavior: Behavior normal.           Assessment & Plan:    Encounter Diagnoses  Name Primary?   Primary hypertension Yes   Hypokalemia    Not proficient in English language    Kidney stones    Paresthesia and pain of both upper extremities      -  Needs labs/ K+ rechecked -[facility]- pt states she is approved.  Will check with care connect on this -Refer to urology and neurology  -pt encouraged to drink plenty of Fluids/5-6 bottles water daily.  She can take Apap or ibu prn pain.  She is given reading information on kidney stones  -pt to continue amlodipine and potassium -pt will follow up in 6 weeks.  She is to contact office sooner prn

## 2023-02-11 NOTE — Patient Instructions (Signed)
Clculos renales Kidney Stones  Los clculos renales son depsitos slidos parecidos a piedras que se forman dentro de los riones. Los riones son un par de rganos que producen la [place]. Un clculo renal se puede formar en un rin y desplazarse a otras partes de las vas urinarias, que incluyen los conductos que conectan los riones con la vejiga (urteres), la vejiga y el conducto que lleva la orina hacia fuera del cuerpo (uretra). A medida que el clculo atraviesa estas zonas, puede causar dolor intenso y obstruir el paso de la [place]. Los clculos renales se forman cuando hay niveles altos de ciertos minerales presentes en la orina. Los clculos suelen eliminarse del cuerpo a travs de la [place], [place], en algunos [place], se necesita tratamiento mdico para eliminarlos. Cules son las causas? Los clculos renales pueden ser causados por lo siguiente: Una afeccin en la cual ciertas glndulas producen mucha hormona paratiroidea (hiperparatiroidismo primario), lo que causa demasiada acumulacin de calcio en la sangre. Una acumulacin de cristales de cido rico en la vejiga (hiperuricosuria). El cido rico es un qumico que el cuerpo produce cuando se ingieren determinados alimentos. Suele eliminarse del cuerpo a travs de Higher education careers adviser. Estrechamiento (estenosis) de un urter o de ambos. Una obstruccin en el rin presente al nacer (obstruccin congnita). Cirugas del rin o los urteres en el pasado. Qu incrementa el riesgo? Los siguientes factores pueden hacer que sea ms propenso a desarrollar esta afeccin: Haber tenido un clculo renal en el pasado. Tener antecedentes familiares de clculos renales. No beber suficiente agua. Seguir una dieta rica en protenas, sal (sodio) o azcar. Tener sobrepeso u obesidad. Cules son los signos o sntomas? Los sntomas de clculos renales pueden incluir los siguientes: Dolor en el costado del abdomen, justo debajo de las costillas (dolor en la fosa  lumbar). Dolor que generalmente se expande (irradia) hacia la ingle. Necesidad de Garment/textile technologist con ms frecuencia o [place]. Dolor al [place]. Sangre en la orina (hematuria). Nuseas. Vmitos. Cristy Hilts y escalofros. Cmo se diagnostica? Esta afeccin se puede diagnosticar en funcin de lo siguiente: Los sntomas y los antecedentes mdicos. Un examen fsico. Anlisis de sangre. Anlisis de [place]. Se pueden realizar antes y despus de que el clculo se elimine del cuerpo a travs de la orina. Estudios de diagnstico por imgenes, como una exploracin por tomografa computarizada (TC), una radiografa abdominal o una ecografa. Una intervencin para examinar el interior de la vejiga (cistoscopia). Cmo se trata? El tratamiento para los clculos renales depende del tamao, la ubicacin y la composicin de los clculos. Los clculos renales suelen eliminarse del cuerpo a travs de la [place]. Puede ser necesario hacer lo siguiente: Aumentar la ingesta de lquidos para ayudar a eliminar el clculo. En algunos casos, pueden administrarle lquidos a travs de [place] y tal vez deba permanecer bajo observacin en el hospital. Tomar analgsicos. Hacer cambios en su alimentacin para ayudar a prevenir que los clculos renales regresen. A veces, se necesitan procedimientos para extraer un clculo renal. Esto puede implicar lo siguiente: Un procedimiento para romper los clculos renales utilizando lo siguiente: Un haz de luz concentrado (terapia lser). Ondas de choque (litotricia extracorprea). Ciruga para extraer los clculos renales. Esto ser necesario si siente dolor intenso o los clculos obstruyen las vas [place]. Siga estas indicaciones en su casa: Medicamentos Use los medicamentos de venta libre y los recetados solamente como se lo haya indicado el mdico. Pregntele al mdico si el medicamento recetado le impide conducir o usar maquinaria pesada. Comida y  bebida Beba suficiente  lquido como para mantener la orina de color amarillo plido. Es posible que le indiquen que beba al [employer] 8 y 71 vasos de agua por Training and development officer. Esto lo ayudar a eliminar el clculo renal. Si se lo indican, modifique la dieta. Esto puede incluir: Limitar la ingesta de sodio. Comer ms frutas y verduras. Limitar la cantidad de protena animal que consume. Las protenas animales incluyen carne [place], aves, pescado y [place]. Tomar una cantidad normal de calcio (1000 a 1300 mg por da). Siga las instrucciones del mdico respecto de las restricciones en las comidas o las bebidas. Indicaciones generales Recoja muestras de orina como se lo haya indicado el mdico. Es posible que deba recoger una [place] de orina: 24 horas despus de eliminar el clculo. Entre 8 y 12 semanas despus de haber eliminado el clculo renal, y cada 6 a 12 meses despus de eso. Cuele la orina cada vez que orine segn las indicaciones del mdico. Use el colador que el mdico le haya recomendado. No deseche el clculo renal despus de haberlo eliminado. Consrvelo para que el mdico Barrister's clerk. Analizar la composicin del clculo renal puede evitar la formacin de posibles clculos renales en el futuro. Concurra a [place]. Es posible que le realicen radiografas o ecografas para asegurarse de que haya eliminado el clculo. Cmo se previene? Para prevenir la formacin de otro clculo renal: Beba suficiente lquido como para mantener la orina de color amarillo plido. Esta es la mejor manera de prevenir la formacin de clculos renales. Siga una dieta saludable. Siga las recomendaciones del mdico respecto de los alimentos que debe evitar. Las recomendaciones varan segn el tipo de clculo renal que tenga. Es posible que le indiquen que siga una dieta baja en protenas. Mantenga un peso saludable. Dnde obtener ms informacin [employer] ([employer]) ([place]):  [email or web address]) (Fundacin de Cuidados Urolgicos): [email or web address].org Comunquese con un mdico si: Tiene un dolor que empeora o que no mejora con los medicamentos. Solicite ayuda de inmediato si: Tiene fiebre o escalofros. Siente dolor intenso. Siente dolor abdominal. Se desmaya. No puede orinar. Resumen Los clculos renales son depsitos slidos parecidos a piedras que se forman dentro de los riones. Los clculos renales pueden causar nuseas, vmitos, sangre en la orina, dolor abdominal y necesidad imperiosa de Garment/textile technologist con frecuencia. El tratamiento para los clculos renales depende del tamao, la ubicacin y la composicin de los clculos. Los clculos renales suelen eliminarse del cuerpo a travs de la [place]. Los clculos renales pueden prevenirse bebiendo suficientes lquidos, siguiendo una dieta saludable y manteniendo un peso saludable. Esta informacin no tiene Marine scientist el consejo del mdico. Asegrese de hacerle al mdico cualquier pregunta que tenga. Document Revised: 04/16/2022 Document Reviewed: 04/16/2022 Elsevier Patient Education  [place].

## 2023-02-14 ENCOUNTER — Other Ambulatory Visit (HOSPITAL_COMMUNITY)
Admission: RE | Admit: 2023-02-14 | Discharge: 2023-02-14 | Disposition: A | Discharge status: Home / Self Care | Payer: Self-pay | Source: Ambulatory Visit | Attending: Physician Assistant | Admitting: Physician Assistant

## 2023-02-14 DIAGNOSIS — I10 Essential (primary) hypertension: Secondary | ICD-10-CM | POA: Insufficient documentation

## 2023-02-14 DIAGNOSIS — E876 Hypokalemia: Secondary | ICD-10-CM | POA: Insufficient documentation

## 2023-02-14 LAB — POTASSIUM: Potassium: 2.9 mmol/L — ABNORMAL LOW (ref 3.5–5.1)

## 2023-02-18 ENCOUNTER — Other Ambulatory Visit: Payer: Self-pay | Admitting: Physician Assistant

## 2023-02-18 MED ORDER — POTASSIUM CHLORIDE CRYS ER 10 MEQ PO TBCR: 30.0000 meq | EXTENDED_RELEASE_TABLET | Freq: Every day | ORAL | 1 refills | Status: DC

## 2023-03-10 ENCOUNTER — Other Ambulatory Visit: Payer: Self-pay | Admitting: Physician Assistant

## 2023-03-10 DIAGNOSIS — R7989 Other specified abnormal findings of blood chemistry: Secondary | ICD-10-CM

## 2023-03-10 DIAGNOSIS — I10 Essential (primary) hypertension: Secondary | ICD-10-CM

## 2023-03-10 DIAGNOSIS — E876 Hypokalemia: Secondary | ICD-10-CM

## 2023-03-12 ENCOUNTER — Encounter: Payer: Self-pay | Admitting: Urology

## 2023-03-12 ENCOUNTER — Ambulatory Visit (INDEPENDENT_AMBULATORY_CARE_PROVIDER_SITE_OTHER): Payer: Self-pay | Admitting: Urology

## 2023-03-12 VITALS — BP 125/85 | HR 76 | Ht 62.0 in | Wt 147.0 lb

## 2023-03-12 DIAGNOSIS — N200 Calculus of kidney: Secondary | ICD-10-CM

## 2023-03-12 DIAGNOSIS — M549 Dorsalgia, unspecified: Secondary | ICD-10-CM

## 2023-03-12 DIAGNOSIS — G8929 Other chronic pain: Secondary | ICD-10-CM

## 2023-03-12 MED ORDER — CYCLOBENZAPRINE HCL 5 MG PO TABS: 5.0000 mg | ORAL_TABLET | Freq: Three times a day (TID) | ORAL | 1 refills | Status: DC | PRN

## 2023-03-12 MED ORDER — TAMSULOSIN HCL 0.4 MG PO CAPS: 0.4000 mg | ORAL_CAPSULE | Freq: Every day | ORAL | 11 refills | Status: DC

## 2023-03-12 NOTE — Progress Notes (Signed)
03/12/2023 4:16 PM   Sylvia Nguyen August 23, 1971 [id number]  Referring provider: Soyla Dryer, PA-C [address] [place],  [place] [address]  nephrolithiasis   HPI: Sylvia Nguyen is a 52yo here for evaluation of nephrolithiasis and back pain. January 2024 she presented to [facility] and was diagnosed with bilateral small renal calculi. She has intermittent mild sharp bilateral flank pain. No family hx of nephrolithiasis. Sometimes she has back pain worse on the left that radiates down her left leg. She denies any LUTS. No dysuria or hematuria. The back pain is worse with activity. No other associated symptoms   PMH: Past Medical History:  Diagnosis Date   GERD (gastroesophageal reflux disease)    Hyperlipidemia    Hypertension    Nephrolithiasis    Uterine fibroid     Surgical History: Past Surgical History:  Procedure Laterality Date   COLONOSCOPY N/A 03/30/2020   Dr. Oneida Alar: Tortuous left colon, 3 mm polyp removed benign.  Internal/external hemorrhoids Next colonoscopy in 10 years   POLYPECTOMY  03/30/2020   Procedure: POLYPECTOMY;  Surgeon: Danie Binder, MD;  [facility];  Service: Endoscopy;;  rectal    Home Medications:  Allergies as of 03/12/2023       Reactions   Megavite Fruits & Veggies [actical] Itching        Medication List        Accurate as of March 12, 2023  4:16 PM. If you have any questions, ask your nurse or doctor.          amLODipine 10 MG tablet Commonly known as: NORVASC Take 1 tablet (10 mg total) by mouth daily. Tome una tableta por boca diaria   potassium chloride 10 MEQ tablet Commonly known as: KLOR-CON M Take 3 tablets (30 mEq total) by mouth daily. Tome tres tabletas por boca diaria        Allergies:  Allergies  Allergen Reactions   Megavite Fruits & Veggies [Actical] Itching    Family History: Family History  Problem Relation Age of Onset   Other Father        MVA   Colon cancer Neg Hx      Social History:  reports that she has never smoked. She has never used smokeless tobacco. She reports that she does not drink alcohol and does not use drugs.  ROS: All other review of systems were reviewed and are negative except what is noted above in HPI  Physical Exam: BP 125/85   Pulse 76   Ht 5\' 2"  (1.575 m)   Wt 147 lb (66.7 kg)   BMI 26.89 kg/m   Constitutional:  Alert and oriented, No acute distress. HEENT: [place] AT, moist mucus membranes.  Trachea midline, no masses. Cardiovascular: No clubbing, cyanosis, or edema. Respiratory: Normal respiratory effort, no increased work of breathing. GI: Abdomen is soft, nontender, nondistended, no abdominal masses GU: No CVA tenderness.  Lymph: No cervical or inguinal lymphadenopathy. Skin: No rashes, bruises or suspicious lesions. Neurologic: Grossly intact, no focal deficits, moving all 4 extremities. Psychiatric: Normal mood and affect.  Laboratory Data: Lab Results  Component Value Date   WBC 7.6 07/24/2022   HGB 13.2 07/24/2022   HCT 39.3 07/24/2022   MCV 86.8 07/24/2022   PLT 267 07/24/2022    Lab Results  Component Value Date   CREATININE 0.64 01/07/2023    No results found for: "PSA"  No results found for: "TESTOSTERONE"  Lab Results  Component Value Date  HGBA1C 5.6 09/04/2022    Urinalysis    Component Value Date/Time   COLORURINE YELLOW 10/05/2021 1449   APPEARANCEUR CLEAR 10/05/2021 1449   LABSPEC 1.020 10/05/2021 1449   PHURINE 6.0 10/05/2021 1449   GLUCOSEU NEGATIVE 10/05/2021 1449   HGBUR TRACE (A) 10/05/2021 1449   BILIRUBINUR NEGATIVE 10/05/2021 1449   KETONESUR NEGATIVE 10/05/2021 1449   PROTEINUR NEGATIVE 10/05/2021 1449   UROBILINOGEN 0.2 01/31/2010 2318   NITRITE NEGATIVE 10/05/2021 1449   LEUKOCYTESUR SMALL (A) 10/05/2021 1449    Lab Results  Component Value Date   BACTERIA FEW (A) 10/05/2021    Pertinent Imaging: CT 01/15/2023: Images reviewed and discussed with the patient   No results found for this or any previous visit.  No results found for this or any previous visit.  No results found for this or any previous visit.  No results found for this or any previous visit.  No results found for this or any previous visit.  No valid procedures specified. No results found for this or any previous visit.  Results for orders placed during the hospital encounter of 10/24/20  CT Renal Stone Study  Narrative CLINICAL DATA:  Back pain, flank pain, nephrolithiasis  EXAM: CT ABDOMEN AND PELVIS WITHOUT CONTRAST  TECHNIQUE: Multidetector CT imaging of the abdomen and pelvis was performed following the standard protocol without IV contrast.  COMPARISON:  04/06/2020  FINDINGS: Lower chest: The visualized lung bases are clear bilaterally. The visualized heart and pericardium are unremarkable peer  Hepatobiliary: No focal liver abnormality is seen. No gallstones, gallbladder wall thickening, or biliary dilatation.  Pancreas: Unremarkable  Spleen: Unremarkable  Adrenals/Urinary Tract: The adrenal glands are unremarkable. The kidneys are normal in size and position. There is moderate right hydronephrosis and hydroureter secondary to an obstructing 3 mm x 4 mm x 5 mm calculus at the right ureterovesicular junction. There are at least 4 nonobstructing renal calculi within the right kidney measuring up to 3 mm. No nephro or urolithiasis on the left. No hydronephrosis on the left. The bladder is unremarkable.  Stomach/Bowel: Stomach is within normal limits. Appendix appears normal. No evidence of bowel wall thickening, distention, or inflammatory changes. No free intraperitoneal gas or fluid. Tiny fat containing umbilical hernia.  Vascular/Lymphatic: Minimal atherosclerotic calcification within the a left common iliac artery. No aortic aneurysm. No pathologic adenopathy within the abdomen and pelvis.  Reproductive: Uterus and bilateral adnexa are  unremarkable.  Other: Rectum unremarkable  Musculoskeletal: No acute or significant osseous findings.  IMPRESSION: Moderate right hydronephrosis and hydroureter secondary to an obstructing 3 mm x 4 mm x 5 mm calculus at the right ureterovesicular junction. Superimposed mild right nonobstructing nephrolithiasis.  Aortic Atherosclerosis (ICD10-I70.0).   Electronically Signed By: Fidela Salisbury MD On: 10/24/2020 01:13   Assessment & Plan:    1. Nephrolithiasis -We discussed the management of kidney stones. These options include observation, ureteroscopy, shockwave lithotripsy (ESWL) and percutaneous nephrolithotomy (PCNL). We discussed which options are relevant to the patient's stone(s). We discussed the natural history of kidney stones as well as the complications of untreated stones and the impact on quality of life without treatment as well as with each of the above listed treatments. We also discussed the efficacy of each treatment in its ability to clear the stone burden. With any of these management options I discussed the signs and symptoms of infection and the need for emergent treatment should these be experienced. For each option we discussed the ability of each procedure to clear  the patient of their stone burden.   For observation I described the risks which include but are not limited to silent renal damage, life-threatening infection, need for emergent surgery, failure to pass stone and pain.   For ureteroscopy I described the risks which include bleeding, infection, damage to contiguous structures, positioning injury, ureteral stricture, ureteral avulsion, ureteral injury, need for prolonged ureteral stent, inability to perform ureteroscopy, need for an interval procedure, inability to clear stone burden, stent discomfort/pain, heart attack, stroke, pulmonary embolus and the inherent risks with general anesthesia.   For shockwave lithotripsy I described the risks which  include arrhythmia, kidney contusion, kidney hemorrhage, need for transfusion, pain, inability to adequately break up stone, inability to pass stone fragments, Steinstrasse, infection associated with obstructing stones, need for alternate surgical procedure, need for repeat shockwave lithotripsy, MI, CVA, PE and the inherent risks with anesthesia/conscious sedation.   For PCNL I described the risks including positioning injury, pneumothorax, hydrothorax, need for chest tube, inability to clear stone burden, renal laceration, arterial venous fistula or malformation, need for embolization of kidney, loss of kidney or renal function, need for repeat procedure, need for prolonged nephrostomy tube, ureteral avulsion, MI, CVA, PE and the inherent risks of general anesthesia.   - The patient would like to proceed with medical expulsive therapy. Rx for flomax given  - Urinalysis, Routine w reflex microscopic  2. Back pain -flexeril 5mg  TID PRN  Nicolette Bang, MD  [facility] [place]

## 2023-03-12 NOTE — Patient Instructions (Signed)
Recomendaciones dietticas para prevenir la formacin de clculos renales Dietary Guidelines to Help Prevent Kidney Stones Los clculos renales son depsitos de minerales y sales que se forman en el interior de los riones. Su riesgo de tener clculos renales puede ser mayor en funcin de su dieta, el estilo de vida, los medicamentos que toma y la presencia de ciertas afecciones mdicas. La mayora de las personas pueden reducir el riesgo de desarrollar clculos renales siguiendo estas pautas alimentarias. Su nutricionista puede darle indicaciones ms especficas segn su estado de salud general y el tipo de clculos renales que tienda a desarrollar. Consejos para seguir este plan Al leer las etiquetas de los alimentos  Elija alimentos con etiquetas que digan "sin sal agregada" o "bajo contenido de sal". Limite el consumo de sal (sodio) a menos de 1,500 mg por da. Elija alimentos con calcio para incluirlos en cada comida o refrigerio. Trate de incorporar 300 mg de calcio en cada comida. Entre los alimentos que contienen de 200 a 500 mg de calcio por porcin se incluyen los siguientes: 8 onzas (237 ml) de leche, leche no lcteafortificada con calcio y jugo de frutafortificado con calcio. Que una bebida est fortificada con calcio significa que se le ha aadido calcio. 8 onzas (237 ml) de kfir, yogur y yogur de soja. 4 onzas (114 ml) de tofu. 1 onza (28 g) de queso. 1 taza (150 g) de higos secos. 1 taza (91 g) de brcoli cocido. Una lata de 3 onzas (85 g) de sardinas o caballa. La mayora de las personas necesitan de 1,000 a 1,500 mg de calcio por da. Hable con su nutricionista sobre la cantidad de calcio que recomendara para usted. Al ir de compras Compre mucha fruta y verdura fresca. La mayora de las personas no necesitan dejar de consumir frutas y verduras, aun cuando contengan nutrientes que puedan contribuir a formar clculos renales. Cuando compre alimentos semipreparados, elija los  siguientes: Frutas enteras. Ensaladas preparadas con aderezo aparte. Batidos de fruta y yogur descremados. Evite comprar comidas congeladas o fiambres. Estos pueden tener un alto contenido de sodio. Busque alimentos con cultivos vivos, como yogur y kfir. Elija cereales con alto contenido de fibras, como panes integrales, salvado de avena y cereales de trigo integral. Al cocinar No agregue sal a los alimentos cuando cocine. Ponga un salero en la mesa y deje que cada persona agregue sal a su gusto. Para las pastas, guisos y sopas, use protenas vegetales, como frijoles, protena vegetal texturada o tofu, en lugar de carnes. Planificacin de las comidas Si su nutricionista se lo indica, ingiera menos sal. Para hacer esto: Evite consumir alimentos procesados o preelaborados. Evite las comidas rpidas. Coma menos protenas animales, como queso, carne de vaca, ave o pescado, si as se lo indica su nutricionista. Para hacer esto: Limite la cantidad de veces que ingiere carne de vaca, ave, pescado, o queso por semana. Siga una dieta sin carne vacuna, al menos 2 das por semana. Coma solo una porcin por da de carne de vaca, ave, pescado o mariscos. Cuando prepare protenas animales, corte los trozos en porciones pequeas. En trminos generales, una porcin de carne vacuna o de pescado tiene el tamao aproximado de la palma de la mano. Coma al menos cinco porciones de frutas y verduras frescas por da. Para hacer esto: Tenga a mano frutas y verduras para los refrigerios. Coma una fruta o un puado de frutos rojos con el desayuno. Coma una ensalada y fruta en el almuerzo. Incorpore dos clases de   verduras en la cena. Es posible que le hayan indicado que limite los alimentos con alto contenido de una sustancia llamada oxalato. Estos incluyen: Espinaca (cocida), ruibarbo, remolachas, batatas y acelga. Manes. Papas fritas de bolsa, papas fritas y papas al horno con piel. Frutos secos y productos con  frutos secos. Chocolate. Si toma diurticos regularmente, asegrese de comer al menos 1 o 2 porciones de frutas o verduras con alto contenido de potasio todos los das. Estos incluyen: [place]. Banana. Naranja, ciruela pasa, zanahoria, o jugo de tomate. Patata al horno. Repollo. Frijoles y arvejas partidas. Estilo de vida  Beba suficiente lquido como para mantener la orina de color amarillo plido. Esto es lo ms importante que puede hacer. Distribuya la ingesta de lquidos durante el da. Si bebe alcohol: Limite la cantidad que bebe a lo siguiente: De 0 a 1 medida por da para las mujeres que no estn embarazadas. De 0 a 2 medidas por da para los hombres. Sepa cunta cantidad de alcohol hay en las bebidas que toma. En los [place], una medida equivale a una botella de cerveza de 12 oz (355 ml), un vaso de vino de 5 oz (148 ml) o un vaso de una bebida alcohlica de alta graduacin de 1 oz (44 ml). Si su mdico se lo indica, baje de peso. Trabaje con su nutricionista para encontrar el plan de alimentacin y las estrategias para bajar de peso que mejor funcionen para usted. Informacin general Hable con su mdico y su nutricionista sobre tomar suplementos diarios. En funcin de su salud y de la causa de sus clculos renales, usted puede recibir las siguientes recomendaciones: No tomar suplementos de vitamina C en dosis altas (1000 mg al da o ms). Tomar un suplemento de calcio. Tomar un suplemento probitico diario. Tomar otros suplementos como magnesio, aceite de pescado o vitamina B6. Use los medicamentos de venta libre y los recetados solamente como se lo haya indicado el mdico. Estos incluyen suplementos. Qu alimentos debo limitar? Limite la ingesta de los siguientes alimentos, o cmalos segn se lo indique su nutricionista. Verduras Espinaca. Ruibarbo. Remolachas. Verduras enlatadas. Pepinillos. Aceitunas. Papas al horno con piel. Granos Salvado de trigo. Panificados.  Galletas saladas. Cereales con alto contenido de azcar. Carnes y otras protenas Frutos secos. Mantequilla de frutos secos. Porciones grandes de carne vacuna, de ave o de pescado. Carnes saladas, precocidas o curadas, como salchichas, panes de carne y hot dogs. Lcteos Quesos. Bebidas Refrescos regulares. Jugo de verduras regular. Alios y condimentos Mezclas de condimentos con sal. Condimentos para ensalada. Salsa de soja. Ktchup. Salsa barbacoa. Otros alimentos Sopas enlatadas. Salsa en lata para pastas. Guisos. Pizza. Lasaa. Comidas congeladas. Papas fritas en bolsa. Papas fritas. Es posible que los productos que se enumeran ms arriba no constituyan una lista completa de los alimentos y las bebidas que debe limitar. Consulte a un nutricionista para obtener ms informacin. Qu alimentos debo evitar? Hable con su nutricionista sobre los alimentos especficos que debera evitar de acuerdo con el tipo de clculos renales que tiene y su estado de salud general. Frutas Pomelo. Es posible que los productos que se enumeran ms arriba no constituyan una lista completa de los alimentos y las bebidas que debe evitar. Consulte a un nutricionista para obtener ms informacin. Resumen Los clculos renales son depsitos de minerales y sales que se forman en el interior de los riones. Puede reducir el riesgo de tener clculos renales si introduce cambios en su alimentacin. Lo ms importante es que beba mucho lquido. Beba   suficiente lquido como para mantener la orina de color amarillo plido. Hable con su nutricionista sobre la cantidad de calcio que debe consumir por da y consuma menos sal y protenas animales como se lo haya indicado el nutricionista. Esta informacin no tiene como fin reemplazar el consejo del mdico. Asegrese de hacerle al mdico cualquier pregunta que tenga. Document Revised: 04/16/2022 Document Reviewed: 04/16/2022 Elsevier Patient Education  2023 Elsevier Inc.  

## 2023-03-13 LAB — URINALYSIS, ROUTINE W REFLEX MICROSCOPIC
Bilirubin, UA: NEGATIVE
Glucose, UA: NEGATIVE
Ketones, UA: NEGATIVE
Leukocytes,UA: NEGATIVE
Nitrite, UA: NEGATIVE
Protein,UA: NEGATIVE
RBC, UA: NEGATIVE
Specific Gravity, UA: 1.015 (ref 1.005–1.030)
Urobilinogen, Ur: 0.2 mg/dL (ref 0.2–1.0)
pH, UA: 7 (ref 5.0–7.5)

## 2023-03-28 ENCOUNTER — Other Ambulatory Visit (HOSPITAL_COMMUNITY)
Admission: RE | Admit: 2023-03-28 | Discharge: 2023-03-28 | Disposition: A | Discharge status: Home / Self Care | Payer: Self-pay | Source: Ambulatory Visit | Attending: Physician Assistant | Admitting: Physician Assistant

## 2023-03-28 DIAGNOSIS — E876 Hypokalemia: Secondary | ICD-10-CM | POA: Insufficient documentation

## 2023-03-28 DIAGNOSIS — R7989 Other specified abnormal findings of blood chemistry: Secondary | ICD-10-CM | POA: Insufficient documentation

## 2023-03-28 DIAGNOSIS — I10 Essential (primary) hypertension: Secondary | ICD-10-CM | POA: Insufficient documentation

## 2023-03-28 LAB — COMPREHENSIVE METABOLIC PANEL
ALT: 21 U/L (ref 0–44)
AST: 21 U/L (ref 15–41)
Albumin: 3.8 g/dL (ref 3.5–5.0)
Alkaline Phosphatase: 135 U/L — ABNORMAL HIGH (ref 38–126)
Anion gap: 10 (ref 5–15)
BUN: 17 mg/dL (ref 6–20)
CO2: 24 mmol/L (ref 22–32)
Calcium: 8.9 mg/dL (ref 8.9–10.3)
Chloride: 103 mmol/L (ref 98–111)
Creatinine, Ser: 0.64 mg/dL (ref 0.44–1.00)
GFR, Estimated: >60 mL/min (ref >60)
Glucose, Bld: 101 mg/dL — ABNORMAL HIGH (ref 70–99)
Potassium: 2.8 mmol/L — ABNORMAL LOW (ref 3.5–5.1)
Sodium: 137 mmol/L (ref 135–145)
Total Bilirubin: 0.8 mg/dL (ref 0.3–1.2)
Total Protein: 7.1 g/dL (ref 6.5–8.1)

## 2023-04-01 ENCOUNTER — Encounter: Payer: Self-pay | Admitting: Physician Assistant

## 2023-04-01 ENCOUNTER — Ambulatory Visit: Payer: Self-pay | Admitting: Physician Assistant

## 2023-04-01 VITALS — BP 141/88 | HR 58 | Temp 97.5°F

## 2023-04-01 DIAGNOSIS — E876 Hypokalemia: Secondary | ICD-10-CM

## 2023-04-01 DIAGNOSIS — I10 Essential (primary) hypertension: Secondary | ICD-10-CM

## 2023-04-01 DIAGNOSIS — Z789 Other specified health status: Secondary | ICD-10-CM

## 2023-04-01 DIAGNOSIS — R7989 Other specified abnormal findings of blood chemistry: Secondary | ICD-10-CM

## 2023-04-01 MED ORDER — AMLODIPINE BESYLATE 10 MG PO TABS: 10.0000 mg | ORAL_TABLET | Freq: Every day | ORAL | 1 refills | Status: DC

## 2023-04-01 NOTE — Progress Notes (Signed)
BP (!) 141/88   Pulse (!) 58   Temp (!) 97.5 F (36.4 C)   SpO2 99%    Subjective:    Patient ID: Sylvia Nguyen, female    DOB: 1971-07-17, 52 y.o.   MRN: [id number]  HPI: Sylvia Nguyen is a 52 y.o. female presenting on 04/01/2023 for Follow-up   HPI  Chief Complaint  Patient presents with   Follow-up     Pt says she ran Out of amlodipine for 2 days.   Yesterday translantor said she was only taking her K+ qd and not 3 tab qd (when called with labs).    She has appt to f/u with urology next month.  Pt says neurology told her she has to pay them money in order to see them (?old bill?)    Relevant past medical, surgical, family and social history reviewed and updated as indicated. Interim medical history since our last visit reviewed. Allergies and medications reviewed and updated.   Current Outpatient Medications:    potassium chloride (KLOR-CON M) 10 MEQ tablet, Take 3 tablets (30 mEq total) by mouth daily. Tome tres tabletas por boca diaria, Disp: 90 tablet, Rfl: 1   amLODipine (NORVASC) 10 MG tablet, Take 1 tablet (10 mg total) by mouth daily. Tome una tableta por boca diaria (Patient not taking: Reported on 04/01/2023), Disp: 30 tablet, Rfl: 1   cyclobenzaprine (FLEXERIL) 5 MG tablet, Take 1 tablet (5 mg total) by mouth 3 (three) times daily as needed for muscle spasms., Disp: 30 tablet, Rfl: 1   tamsulosin (FLOMAX) 0.4 MG CAPS capsule, Take 1 capsule (0.4 mg total) by mouth daily after supper., Disp: 30 capsule, Rfl: 11   Review of Systems  Per HPI unless specifically indicated above     Objective:    BP (!) 141/88   Pulse (!) 58   Temp (!) 97.5 F (36.4 C)   SpO2 99%   Wt Readings from Last 3 Encounters:  03/12/23 147 lb (66.7 kg)  02/11/23 147 lb 12 oz (67 kg)  11/19/22 142 lb 8 oz (64.6 kg)    Physical Exam Vitals reviewed.  Constitutional:      General: She is not in acute distress.    Appearance: She is well-developed. She is not toxic-appearing.   HENT:     Head: Normocephalic and atraumatic.  Cardiovascular:     Rate and Rhythm: Normal rate and regular rhythm.  Pulmonary:     Effort: Pulmonary effort is normal.     Breath sounds: Normal breath sounds.  Abdominal:     General: Bowel sounds are normal.     Palpations: Abdomen is soft. There is no mass.     Tenderness: There is no abdominal tenderness.  Musculoskeletal:     Cervical back: Neck supple.     Right lower leg: No edema.     Left lower leg: No edema.  Lymphadenopathy:     Cervical: No cervical adenopathy.  Skin:    General: Skin is warm and dry.  Neurological:     Mental Status: She is alert and oriented to person, place, and time.  Psychiatric:        Behavior: Behavior normal.     Results for orders placed or performed during the hospital encounter of 03/28/23  Comprehensive metabolic panel  Result Value Ref Range   Sodium 137 135 - 145 mmol/L   Potassium 2.8 (L) 3.5 - 5.1 mmol/L   Chloride 103 98 - 111 mmol/L  CO2 24 22 - 32 mmol/L   Glucose, Bld 101 (H) 70 - 99 mg/dL   BUN 17 6 - 20 mg/dL   Creatinine, Ser 5.46 0.44 - 1.00 mg/dL   Calcium 8.9 8.9 - 56.8 mg/dL   Total Protein 7.1 6.5 - 8.1 g/dL   Albumin 3.8 3.5 - 5.0 g/dL   AST 21 15 - 41 U/L   ALT 21 0 - 44 U/L   Alkaline Phosphatase 135 (H) 38 - 126 U/L   Total Bilirubin 0.8 0.3 - 1.2 mg/dL   GFR, Estimated >12 >75 mL/min   Anion gap 10 5 - 15      Assessment & Plan:    Encounter Diagnoses  Name Primary?   Primary hypertension Yes   Hypokalemia    Elevated LFTs    Not proficient in English language       -Reviewed labs with pt -pt counseled to take K+ - 3 tab bid x 1 week then blood work next week.  -she is encouraged to avoid running out of her bp med -pt to follow up 6 weeks.  She is to contact office sooner prn

## 2023-04-01 NOTE — Patient Instructions (Addendum)
Potassium  -   take 3 tablets two times daily for one week and then go to get bloodwork/labs drawn.    Amlodipine - avoid running out of your medication to keep your blood pressure controlled.   Potasio: tome 3 tabletas dos veces al da durante una semana y luego vaya a hacerse anlisis de sangre o anlisis de laboratorio. Amlodipino: evite quedarse sin medicamento para mantener controlada su presin arterial.

## 2023-04-07 ENCOUNTER — Other Ambulatory Visit: Payer: Self-pay

## 2023-04-07 ENCOUNTER — Emergency Department (HOSPITAL_COMMUNITY)
Admission: EM | Admit: 2023-04-07 | Discharge: 2023-04-07 | Disposition: A | Discharge status: Home / Self Care | Payer: Self-pay | Attending: Emergency Medicine | Admitting: Emergency Medicine

## 2023-04-07 ENCOUNTER — Encounter (HOSPITAL_COMMUNITY): Payer: Self-pay | Admitting: Emergency Medicine

## 2023-04-07 DIAGNOSIS — I10 Essential (primary) hypertension: Secondary | ICD-10-CM | POA: Insufficient documentation

## 2023-04-07 DIAGNOSIS — Z79899 Other long term (current) drug therapy: Secondary | ICD-10-CM | POA: Insufficient documentation

## 2023-04-07 DIAGNOSIS — R0981 Nasal congestion: Secondary | ICD-10-CM | POA: Insufficient documentation

## 2023-04-07 MED ORDER — TRIAMCINOLONE ACETONIDE 55 MCG/ACT NA AERO: 2.0000 | INHALATION_SPRAY | Freq: Every day | NASAL | 1 refills | Status: DC

## 2023-04-07 MED ORDER — AMOXICILLIN 500 MG PO CAPS: 500.0000 mg | ORAL_CAPSULE | Freq: Three times a day (TID) | ORAL | 0 refills | Status: DC

## 2023-04-07 NOTE — Discharge Instructions (Signed)
Begin taking amoxicillin as prescribed.  Begin using Nasacort nasal spray as directed.  Continue taking Allegra as needed for congestion.  Follow-up with primary doctor if not improving in the next week.

## 2023-04-07 NOTE — ED Provider Notes (Signed)
Sylvia Nguyen EMERGENCY DEPARTMENT AT [facility] Provider Note   CSN: [id number] Arrival date & time: 04/07/23  2240     History  Chief Complaint  Patient presents with   Nasal Congestion    Sylvia Nguyen is a 52 y.o. female.  Patient is a 52 year old female with history of seasonal allergies, hypertension.  Patient presenting today with complaints of nasal congestion.  This has been ongoing for the past 3 weeks.  She has been taking Benadryl and Allegra with little relief.  She finds it difficult to sleep at night secondary to the congestion.  She denies any fevers or chills.  She denies any ill contacts.  The history is provided by the patient.       Home Medications Prior to Admission medications   Medication Sig Start Date End Date Taking? Authorizing Provider  amLODipine (NORVASC) 10 MG tablet Take 1 tablet (10 mg total) by mouth daily. Tome una tableta por boca diaria 04/01/23   Jacquelin Hawking, PA-C  potassium chloride (KLOR-CON M) 10 MEQ tablet Take 3 tablets (30 mEq total) by mouth daily. Tome tres tabletas por boca diaria 02/18/23   Jacquelin Hawking, PA-C  tamsulosin (FLOMAX) 0.4 MG CAPS capsule Take 1 capsule (0.4 mg total) by mouth daily after supper. 03/12/23   McKenzie, Mardene Celeste, MD      Allergies    Megavite fruits & veggies [actical]    Review of Systems   Review of Systems  All other systems reviewed and are negative.   Physical Exam Updated Vital Signs BP 119/71   Pulse 62   Temp 97.8 F (36.6 C) (Oral)   Resp 18   Wt 66.7 kg   SpO2 99%   BMI 26.89 kg/m  Physical Exam Vitals and nursing note reviewed.  Constitutional:      General: She is not in acute distress.    Appearance: Normal appearance. She is not ill-appearing.  HENT:     Head: Normocephalic and atraumatic.     Nose: Congestion and rhinorrhea present.     Mouth/Throat:     Mouth: Mucous membranes are moist.     Pharynx: No oropharyngeal exudate or posterior oropharyngeal  erythema.  Cardiovascular:     Rate and Rhythm: Normal rate and regular rhythm.     Heart sounds: No murmur heard. Pulmonary:     Effort: Pulmonary effort is normal. No respiratory distress.     Breath sounds: No wheezing or rhonchi.  Musculoskeletal:     Cervical back: Normal range of motion and neck supple. No rigidity.  Lymphadenopathy:     Cervical: No cervical adenopathy.  Skin:    General: Skin is warm and dry.  Neurological:     Mental Status: She is alert and oriented to person, place, and time.     ED Results / Procedures / Treatments   Labs (all labs ordered are listed, but only abnormal results are displayed) Labs Reviewed - No data to display  EKG None  Radiology No results found.  Procedures Procedures    Medications Ordered in ED Medications - No data to display  ED Course/ Medical Decision Making/ A&P  Patient presenting with nasal congestion for the past 3 weeks.  Symptoms sound most consistent with seasonal allergies, but sinus infection cannot be ruled out.  I will treat patient with antibiotics, Nasacort, and follow-up as needed.  Final Clinical Impression(s) / ED Diagnoses Final diagnoses:  None    Rx / DC Orders ED Discharge  Orders     None         Geoffery Lyons, MD 04/07/23 2348

## 2023-04-07 NOTE — ED Triage Notes (Signed)
Pt complains of allergies and nasal congestion x 3 weeks making it difficult to sleep. Mucinex taken today .

## 2023-04-14 ENCOUNTER — Telehealth: Payer: Self-pay

## 2023-04-14 ENCOUNTER — Other Ambulatory Visit (HOSPITAL_COMMUNITY)
Admission: RE | Admit: 2023-04-14 | Discharge: 2023-04-14 | Disposition: A | Discharge status: Home / Self Care | Payer: Self-pay | Source: Ambulatory Visit | Attending: Physician Assistant | Admitting: Physician Assistant

## 2023-04-14 DIAGNOSIS — E876 Hypokalemia: Secondary | ICD-10-CM | POA: Insufficient documentation

## 2023-04-14 LAB — POTASSIUM: Potassium: 3.4 mmol/L — ABNORMAL LOW (ref 3.5–5.1)

## 2023-04-14 NOTE — Telephone Encounter (Signed)
Made two (2) attempts to f/u with pt by phone call and text message on today but pt was unavailable at the time of the call.    This call was in attempt to provide nurse case management to ensure pt did not have any additional questions or concerns regarding any post hospital instruction,medical concerns, obtaining her medications and serving as a reminder of any upcoming appointments.  Per review of notes, pt is connected with her PCP at [facility] [place] with her last PCP visit on 4.9.24 followup appointment  By way of text message, pt was reminded on times to contact and utilize her PCP versus Urgent Care or Emergency Room   Will attempt to make additional phone contact, if pt does not attempt  to return call first.

## 2023-04-15 ENCOUNTER — Telehealth: Payer: Self-pay

## 2023-04-15 NOTE — Telephone Encounter (Signed)
Pt returned phone call by way of Spanish interpreter/Care Guide Sylvia Nguyen.  Pt was following up in regards to my attempt to contact her per her hospital/ER visit on 4.15.24.  Pt states she continues to have issues with her allergies /nasal congestion and feels as though what she is currently taking is still not helping to improve her symptoms.  Pt was advised to follow up with her PCP at [facility] and to consult about any new recommendations of medication or treatments that can be provided.    Pt was educated on the appropriate times to contact her PCP versus attending the Urgent Care or Emergency Room  Pt stated she has no other needs and was very grateful of receiving the information provided and will attempt to call her PCP on 4.24.24  The call ended

## 2023-04-17 ENCOUNTER — Other Ambulatory Visit: Payer: Self-pay | Admitting: Physician Assistant

## 2023-04-17 DIAGNOSIS — E876 Hypokalemia: Secondary | ICD-10-CM

## 2023-04-29 ENCOUNTER — Other Ambulatory Visit: Payer: Self-pay | Admitting: Physician Assistant

## 2023-05-05 ENCOUNTER — Ambulatory Visit: Payer: Self-pay | Admitting: Urology

## 2023-05-05 ENCOUNTER — Other Ambulatory Visit: Payer: Self-pay

## 2023-05-05 ENCOUNTER — Other Ambulatory Visit: Payer: Self-pay | Admitting: Physician Assistant

## 2023-05-05 DIAGNOSIS — N200 Calculus of kidney: Secondary | ICD-10-CM

## 2023-05-05 MED ORDER — POTASSIUM CHLORIDE ER 10 MEQ PO TBCR: 30.0000 meq | EXTENDED_RELEASE_TABLET | Freq: Every day | ORAL | 0 refills | Status: DC

## 2023-05-13 ENCOUNTER — Ambulatory Visit: Payer: Self-pay | Admitting: Physician Assistant

## 2023-05-15 ENCOUNTER — Encounter: Payer: Self-pay | Admitting: Neurology

## 2023-05-20 ENCOUNTER — Other Ambulatory Visit (HOSPITAL_COMMUNITY)
Admission: RE | Admit: 2023-05-20 | Discharge: 2023-05-20 | Disposition: A | Discharge status: Home / Self Care | Payer: Self-pay | Source: Ambulatory Visit | Attending: Physician Assistant | Admitting: Physician Assistant

## 2023-05-20 ENCOUNTER — Ambulatory Visit: Payer: Self-pay | Admitting: Physician Assistant

## 2023-05-20 ENCOUNTER — Encounter: Payer: Self-pay | Admitting: Physician Assistant

## 2023-05-20 VITALS — BP 110/66 | HR 72 | Temp 98.2°F | Ht 62.0 in | Wt 144.5 lb

## 2023-05-20 DIAGNOSIS — Z789 Other specified health status: Secondary | ICD-10-CM

## 2023-05-20 DIAGNOSIS — Z2821 Immunization not carried out because of patient refusal: Secondary | ICD-10-CM

## 2023-05-20 DIAGNOSIS — E876 Hypokalemia: Secondary | ICD-10-CM

## 2023-05-20 DIAGNOSIS — I10 Essential (primary) hypertension: Secondary | ICD-10-CM

## 2023-05-20 DIAGNOSIS — Z1239 Encounter for other screening for malignant neoplasm of breast: Secondary | ICD-10-CM

## 2023-05-20 LAB — POTASSIUM: Potassium: 3.5 mmol/L (ref 3.5–5.1)

## 2023-05-20 MED ORDER — AMLODIPINE BESYLATE 10 MG PO TABS: 10.0000 mg | ORAL_TABLET | Freq: Every day | ORAL | 1 refills | Status: DC

## 2023-05-20 MED ORDER — POTASSIUM CHLORIDE ER 10 MEQ PO TBCR: 30.0000 meq | EXTENDED_RELEASE_TABLET | Freq: Every day | ORAL | 0 refills | Status: DC

## 2023-05-20 NOTE — Progress Notes (Signed)
BP 110/66   Pulse 72   Temp 98.2 F (36.8 C)   Ht 5\' 2"  (1.575 m)   Wt 144 lb 8 oz (65.5 kg)   SpO2 97%   BMI 26.43 kg/m    Subjective:    Patient ID: Sylvia Nguyen, female    DOB: 03/22/1971, 52 y.o.   MRN: [id number]  HPI: ARIEYANA BREDEHOEFT is a 52 y.o. female presenting on 05/20/2023 for Hypertension and Follow-up   HPI  Chief Complaint  Patient presents with   Hypertension   Follow-up    Pt is still takig  3 tab bid of her K+; she was supposed to take it bid for one week only and then return to 3 tab qd.  She has not yet gotten her labs drawn.  Pt has had c/o BUE numbness since October 2023.  EMG was ordered but never done (pt didn't want to schedule before getting cafa).  She was seen in ER in December and referred to neurologist. Pt says numbness BUE is gone now-  it has been gone for 20-30 days.     Relevant past medical, surgical, family and social history reviewed and updated as indicated. Interim medical history since our last visit reviewed. Allergies and medications reviewed and updated.   Current Outpatient Medications:    amLODipine (NORVASC) 10 MG tablet, Take 1 tablet (10 mg total) by mouth daily. Tome una tableta por boca diaria, Disp: 30 tablet, Rfl: 1   potassium chloride (KLOR-CON) 10 MEQ tablet, Take 3 tablets (30 mEq total) by mouth daily., Disp: 90 tablet, Rfl: 0   tamsulosin (FLOMAX) 0.4 MG CAPS capsule, Take 1 capsule (0.4 mg total) by mouth daily after supper., Disp: 30 capsule, Rfl: 11   triamcinolone (NASACORT) 55 MCG/ACT AERO nasal inhaler, Place 2 sprays into the nose daily. (Patient not taking: Reported on 05/20/2023), Disp: 1 each, Rfl: 1    Review of Systems  Per HPI unless specifically indicated above     Objective:    BP 110/66   Pulse 72   Temp 98.2 F (36.8 C)   Ht 5\' 2"  (1.575 m)   Wt 144 lb 8 oz (65.5 kg)   SpO2 97%   BMI 26.43 kg/m   Wt Readings from Last 3 Encounters:  05/20/23 144 lb 8 oz (65.5 kg)  04/07/23 147  lb (66.7 kg)  03/12/23 147 lb (66.7 kg)    Physical Exam Vitals reviewed.  Constitutional:      General: She is not in acute distress.    Appearance: She is well-developed. She is not toxic-appearing.  HENT:     Head: Normocephalic and atraumatic.  Cardiovascular:     Rate and Rhythm: Normal rate and regular rhythm.  Pulmonary:     Effort: Pulmonary effort is normal.     Breath sounds: Normal breath sounds.  Abdominal:     General: Bowel sounds are normal.     Palpations: Abdomen is soft. There is no mass.     Tenderness: There is no abdominal tenderness.  Musculoskeletal:     Cervical back: Neck supple.  Lymphadenopathy:     Cervical: No cervical adenopathy.  Skin:    General: Skin is warm and dry.  Neurological:     Mental Status: She is alert and oriented to person, place, and time.  Psychiatric:        Behavior: Behavior normal.           Assessment & Plan:  Encounter Diagnoses  Name Primary?   Primary hypertension Yes   Hypokalemia    Not proficient in English language    Encounter for screening for malignant neoplasm of breast, unspecified screening modality    COVID-19 vaccination declined      Htn -well controlled.  Pt to continue amlodipine  Hypokalemia -pt counseled to return to taking K+ daily.  She is encouraged to get labs drawn today when leaves office  Paresthesias -pt encouraged to cancel appt with neurologist if she is still without numbness on Monday morning  HCM -refer for screening mammogram -offered covid vax.  Pt declined  -pt to follow up 3 months.  She is to contact office sooner prn

## 2023-05-20 NOTE — Patient Instructions (Addendum)
Call and cancel appt with neurology on Monday if still without numbness of hands  Llame y cancele la cita con el neurologo el Lunes si aun no tiene entumido las manos

## 2023-05-26 ENCOUNTER — Telehealth: Payer: Self-pay

## 2023-05-26 NOTE — Telephone Encounter (Signed)
Telephoned patient using language line interpreter#[phone]. Left a voice message with [facility] ([facility]) contact information.

## 2023-05-28 ENCOUNTER — Encounter: Payer: Self-pay | Admitting: Neurology

## 2023-05-28 ENCOUNTER — Ambulatory Visit (INDEPENDENT_AMBULATORY_CARE_PROVIDER_SITE_OTHER): Payer: Self-pay | Admitting: Neurology

## 2023-05-28 VITALS — BP 117/68 | HR 74 | Ht 62.0 in | Wt 141.0 lb

## 2023-05-28 DIAGNOSIS — M79601 Pain in right arm: Secondary | ICD-10-CM

## 2023-05-28 DIAGNOSIS — R202 Paresthesia of skin: Secondary | ICD-10-CM

## 2023-05-28 DIAGNOSIS — M79602 Pain in left arm: Secondary | ICD-10-CM

## 2023-05-28 NOTE — Patient Instructions (Signed)
Nerve testing of both arms  ELECTROMYOGRAM AND NERVE CONDUCTION STUDIES (EMG/NCS) INSTRUCTIONS  How to Prepare The neurologist conducting the EMG will need to know if you have certain medical conditions. Tell the neurologist and other EMG lab personnel if you: . Have a pacemaker or any other electrical medical device . Take blood-thinning medications . Have hemophilia, a blood-clotting disorder that causes prolonged bleeding Bathing Take a shower or bath shortly before your exam in order to remove oils from your skin. Don't apply lotions or creams before the exam.  What to Expect You'll likely be asked to change into a hospital gown for the procedure and lie down on an examination table. The following explanations can help you understand what will happen during the exam.  . Electrodes. The neurologist or a technician places surface electrodes at various locations on your skin depending on where you're experiencing symptoms. Or the neurologist may insert needle electrodes at different sites depending on your symptoms.  . Sensations. The electrodes will at times transmit a tiny electrical current that you may feel as a twinge or spasm. The needle electrode may cause discomfort or pain that usually ends shortly after the needle is removed. If you are concerned about discomfort or pain, you may want to talk to the neurologist about taking a short break during the exam.  . Instructions. During the needle EMG, the neurologist will assess whether there is any spontaneous electrical activity when the muscle is at rest - activity that isn't present in healthy muscle tissue - and the degree of activity when you slightly contract the muscle.  He or she will give you instructions on resting and contracting a muscle at appropriate times. Depending on what muscles and nerves the neurologist is examining, he or she may ask you to change positions during the exam.  After your EMG You may experience some  temporary, minor bruising where the needle electrode was inserted into your muscle. This bruising should fade within several days. If it persists, contact your primary care doctor.    

## 2023-05-28 NOTE — Progress Notes (Signed)
[facility] Neurology Division Clinic Note - Initial Visit   Date: 05/28/2023   Sylvia Nguyen MRN: [id number] DOB: 11-Sep-1971   Dear Jacquelin Hawking, PA-C;  Thank you for your kind referral of [name] for consultation of bilateral hand tingling. Although her history is well known to you, please allow [place] to reiterate it for the purpose of our medical record. The patient was accompanied to the clinic by Spanish interpretor who also provides collateral information.     Sylvia Nguyen is a 52 y.o. right-handed female with hypertension presenting for evaluation of bilateral hand tingling.   IMPRESSION/PLAN: Bilateral hand paresthesias, nonspecific.  Symptoms do not fit a cutaneous nerve or dermatome.  Exam is normal. To better localize her symptoms, she will return for NCS/EMG of the arms.  Further recommendations pending results.  -------------------------------------------------- History of present illness: Starting in late 2023, she began having numbness/tingling of the arms that radiates up to her elbows and shoulder.  Symptoms are worse at night.  She has pain in the elbow and swelling. She does not have weakness.  She has generalized pain, including neck pain and bilateral leg pain.  She had grapefruit juice which alleviated her pain in the arms.   She is not working.    Out-side paper records, electronic medical record, and images have been reviewed where available and summarized as:  Lab Results  Component Value Date   HGBA1C 5.6 09/04/2022   No results found for: "VITAMINB12" Lab Results  Component Value Date   TSH 2.844 10/30/2022   No results found for: "ESRSEDRATE", "POCTSEDRATE"  Past Medical History:  Diagnosis Date   GERD (gastroesophageal reflux disease)    Hyperlipidemia    Hypertension    Nephrolithiasis    Uterine fibroid     Past Surgical History:  Procedure Laterality Date   COLONOSCOPY N/A 03/30/2020   Dr. Darrick Penna: Tortuous left  colon, 3 mm polyp removed benign.  Internal/external hemorrhoids Next colonoscopy in 10 years   POLYPECTOMY  03/30/2020   Procedure: POLYPECTOMY;  Surgeon: West Bali, MD;  Location: [facility];  Service: Endoscopy;;  rectal     Medications:  Outpatient Encounter Medications as of 05/28/2023  Medication Sig   amLODipine (NORVASC) 10 MG tablet Take 1 tablet (10 mg total) by mouth daily. Tome una tableta por boca diaria   Fexofenadine HCl (ALLEGRA ALLERGY PO) Take by mouth.   potassium chloride (KLOR-CON) 10 MEQ tablet Take 3 tablets (30 mEq total) by mouth daily.   tamsulosin (FLOMAX) 0.4 MG CAPS capsule Take 1 capsule (0.4 mg total) by mouth daily after supper.   [DISCONTINUED] potassium chloride (KLOR-CON M) 10 MEQ tablet Take 3 tablets (30 mEq total) by mouth daily. Tome tres tabletas por boca diaria   [DISCONTINUED] triamcinolone (NASACORT) 55 MCG/ACT AERO nasal inhaler Place 2 sprays into the nose daily. (Patient not taking: Reported on 05/20/2023)   No facility-administered encounter medications on file as of 05/28/2023.    Allergies:  Allergies  Allergen Reactions   Megavite Fruits & Veggies [Actical] Itching    Family History: Family History  Problem Relation Age of Onset   Other Father        MVA   Colon cancer Neg Hx     Social History: Social History   Tobacco Use   Smoking status: Never   Smokeless tobacco: Never  Vaping Use   Vaping Use: Never used  Substance Use Topics   Alcohol use: No   Drug use:  No   Social History   Social History Narrative   Right Handed   Lives in a one story home     Vital Signs:  BP 117/68   Pulse 74   Ht 5\' 2"  (1.575 m)   Wt 141 lb (64 kg)   SpO2 97%   BMI 25.79 kg/m   Neurological Exam: MENTAL STATUS including orientation to time, place, person, recent and remote memory, attention span and concentration, language, and fund of knowledge is normal.  Speech is not dysarthric.  CRANIAL NERVES: II:  No visual field  defects.     III-IV-VI: Pupils equal round and reactive to light.  Normal conjugate, extra-ocular eye movements in all directions of gaze.  No nystagmus.  No ptosis.   V:  Normal facial sensation.    VII:  Normal facial symmetry and movements.   VIII:  Normal hearing and vestibular function.   IX-X:  Normal palatal movement.   XI:  Normal shoulder shrug and head rotation.   XII:  Normal tongue strength and range of motion, no deviation or fasciculation.  MOTOR:  No atrophy, fasciculations or abnormal movements.  No pronator drift.   Upper Extremity:  Right  Left  Deltoid  5/5   5/5   Biceps  5/5   5/5   Triceps  5/5   5/5   Wrist extensors  5/5   5/5   Wrist flexors  5/5   5/5   Finger extensors  5/5   5/5   Finger flexors  5/5   5/5   Dorsal interossei  5/5   5/5   Abductor pollicis  5/5   5/5   Tone (Ashworth scale)  0  0   Lower Extremity:  Right  Left  Hip flexors  5/5   5/5   Knee flexors  5/5   5/5   Knee extensors  5/5   5/5   Dorsiflexors  5/5   5/5   Plantarflexors  5/5   5/5   Toe extensors  5/5   5/5   Toe flexors  5/5   5/5   Tone (Ashworth scale)  0  0   MSRs:                                           Right        Left brachioradialis 2+  2+  biceps 2+  2+  triceps 2+  2+  patellar 2+  2+  ankle jerk 2+  2+  Hoffman no  no  plantar response down  down   SENSORY:  Normal and symmetric perception of light touch, pinprick, vibration, and proprioception.  Romberg's sign absent.   COORDINATION/GAIT: Normal finger-to- nose-finger.  Intact rapid alternating movements bilaterally.  Able to rise from a chair without using arms.  Gait narrow based and stable. Tandem and stressed gait intact.     Thank you for allowing me to participate in patient's care.  If I can answer any additional questions, I would be pleased to do so.    Sincerely,    Heydy Montilla K. Allena Katz, DO

## 2023-06-03 ENCOUNTER — Ambulatory Visit: Payer: Self-pay | Admitting: Urology

## 2023-06-06 ENCOUNTER — Encounter: Payer: Self-pay | Admitting: Neurology

## 2023-06-12 ENCOUNTER — Ambulatory Visit: Payer: Self-pay | Admitting: Urology

## 2023-06-12 NOTE — Progress Notes (Signed)
History of Present Illness: [name] is a 52 y.o. female who presents today for follow up visit at [facility] [place]. She is accompanied by her son Sylvia Nguyen, who assisted with interpretation per patient request. - GU History: 1. Kidney stones.  At last visit with Dr. Ronne Binning on 03/12/2023: - Reviewed CT abdomen/pelvis w/o contrast from [facility] on 01/17/2023 which showed "Tiny bilateral renal calculi. No evidence of ureteral calculi, hydronephrosis, or other acute findings." - Plan was for medical expulsive therapy with Flomax. Also prescribed Flexeril 5 mg PRN for back pain.   Today: KUB today: Awaiting radiology read. Bowel contents appear to obstruct view of left kidney.  She reports chief complaint of burning pain in her bladder and bilateral low back x2 years which occurs only when she eats spicy / acidic foods or drinks soda. She reports increased urinary urgency and frequency when the pain is present. Denies worsening with bladder fullness. Denies relief with voiding. She states that Flomax helped. She denies dysuria, gross hematuria, straining to void, or sensations of incomplete emptying. She denies recent episode of stone pain / passage; she remains fairly concerned about her kidney stones.   Fall Screening: Do you usually have a device to assist in your mobility? No   Medications: Current Outpatient Medications  Medication Sig Dispense Refill   amLODipine (NORVASC) 10 MG tablet Take 1 tablet (10 mg total) by mouth daily. Tome una tableta por boca diaria 30 tablet 1   Fexofenadine HCl (ALLEGRA ALLERGY PO) Take by mouth daily as needed. prn     potassium chloride (KLOR-CON M) 10 MEQ tablet 3 po every day.  Tome 3 tabletas por boca diaria 90 tablet 0   potassium chloride (KLOR-CON) 10 MEQ tablet Take 3 tablets (30 mEq total) by mouth daily. 90 tablet 0   tamsulosin (FLOMAX) 0.4 MG CAPS capsule Take 1 capsule (0.4 mg total) by mouth daily after supper. 30 capsule 11   No  current facility-administered medications for this visit.    Allergies: Allergies  Allergen Reactions   Megavite Fruits & Veggies [Actical] Itching    Past Medical History:  Diagnosis Date   GERD (gastroesophageal reflux disease)    Hyperlipidemia    Hypertension    Nephrolithiasis    Uterine fibroid    Past Surgical History:  Procedure Laterality Date   COLONOSCOPY N/A 03/30/2020   Dr. Darrick Penna: Tortuous left colon, 3 mm polyp removed benign.  Internal/external hemorrhoids Next colonoscopy in 10 years   POLYPECTOMY  03/30/2020   Procedure: POLYPECTOMY;  Surgeon: West Bali, MD;  Location: [facility];  Service: Endoscopy;;  rectal   Family History  Problem Relation Age of Onset   Other Father        MVA   Colon cancer Neg Hx    Social History   Socioeconomic History   Marital status: Single    Spouse name: Not on file   Number of children: 4   Years of education: Not on file   Highest education level: High school graduate  Occupational History   Not on file  Tobacco Use   Smoking status: Never   Smokeless tobacco: Never  Vaping Use   Vaping Use: Never used  Substance and Sexual Activity   Alcohol use: No   Drug use: No   Sexual activity: Not Currently    Birth control/protection: None  Other Topics Concern   Not on file  Social History Narrative   Right Handed  Lives in a one story home    Social Determinants of Health   Financial Resource Strain: Not on file  Food Insecurity: No Food Insecurity (02/15/2022)   Hunger Vital Sign    Worried About Running Out of Food in the Last Year: Never true    Ran Out of Food in the Last Year: Never true  Transportation Needs: No Transportation Needs (02/15/2022)   PRAPARE - Administrator, Civil Service (Medical): No    Lack of Transportation (Non-Medical): No  Physical Activity: Not on file  Stress: Not on file  Social Connections: Not on file  Intimate Partner Violence: Not on file     SUBJECTIVE  Review of Systems Constitutional: Patient denies any unintentional weight loss or change in strength lntegumentary: Patient denies any rashes or pruritus Eyes: Patient denies dry eyes ENT: Patient denies dry mouth Cardiovascular: Patient denies chest pain or syncope Respiratory: Patient denies shortness of breath Gastrointestinal: Patient denies nausea, vomiting, constipation, or diarrhea Musculoskeletal: Patient denies muscle cramps or weakness Neurologic: Patient denies convulsions or seizures Psychiatric: Patient denies memory problems Allergic/Immunologic: Patient denies recent allergic reaction(s) Hematologic/Lymphatic: Patient denies bleeding tendencies Endocrine: Patient denies heat/cold intolerance  GU: As per HPI.  OBJECTIVE Vitals:   06/17/23 1445  BP: 113/75  Pulse: 82  Temp: 98 F (36.7 C)   There is no height or weight on file to calculate BMI.  Physical Examination  Constitutional: No obvious distress; patient is non-toxic appearing  Cardiovascular: No visible lower extremity edema.  Respiratory: The patient does not have audible wheezing/stridor; respirations do not appear labored  Gastrointestinal: Abdomen non-distended Musculoskeletal: Normal ROM of UEs  Skin: No obvious rashes/open sores  Neurologic: CN 2-12 grossly intact Psychiatric: Answered questions appropriately with normal affect  Hematologic/Lymphatic/Immunologic: No obvious bruises or sites of spontaneous bleeding  UA: negative  ASSESSMENT Nephrolithiasis - Plan: Urinalysis, Routine w reflex microscopic, CT RENAL STONE STUDY  Lower abdominal pain - Plan: CT RENAL STONE STUDY  Bilateral flank pain - Plan: CT RENAL STONE STUDY  Bladder pain  We reviewed recent imaging results; awaiting radiology results, appears to have no acute findings on today's KUB which would explain her bilateral flank pain. It was explained to patient that bilateral flank / low back pain is typically  not attributable to kidney stones however she remains quite concerned about her stones. We agreed to proceed with CT stone study for further evaluation of stone burden due to impaired visualization on today's KUB due to overlying bowel contents and to assess for hydronephrosis or other GU findings which may be contributory to her pain. She was advised that if negative then she will be advise to follow up with PCP to evaluate for non-GU etiology, such as musculoskeletal pain.   For bladder pain, we discussed suspected interstitial cystitis. It seems she is able to manage those symptoms with simple behavioral modification via avoidance of dietary triggers. She is concerned about other possible underlying causes however therefore we agreed to proceed with cystoscopy with Dr. Ronne Binning for further evaluation.   Pt verbalized understanding and agreement. All questions were answered.  PLAN Advised the following: CT stone. Avoid dietary triggers for bladder pain.  Return for 1st available cystoscopy with Dr. Ronne Binning.  Orders Placed This Encounter  Procedures   CT RENAL STONE STUDY    Standing Status:   Future    Standing Expiration Date:   06/16/2024    Order Specific Question:   Is patient pregnant?  Answer:   No    Order Specific Question:   Preferred imaging location?    Answer:   [facility]   Urinalysis, Routine w reflex microscopic   Total time spent caring for the patient today was over 30 minutes. This includes time spent on the date of the visit reviewing the patient's chart before the visit, time spent during the visit, and time spent after the visit on documentation. Over 50% of that time was spent in face-to-face time with this patient for direct counseling. E&M based on time and complexity of medical decision making.  It has been explained that the patient is to follow regularly with their PCP in addition to all other providers involved in their care and to follow instructions  provided by these respective offices. Patient advised to contact urology clinic if any urologic-pertaining questions, concerns, new symptoms or problems arise in the interim period.  There are no Patient Instructions on file for this visit.  Electronically signed by:  Donnita Falls, MSN, FNP-C, CUNP 06/17/2023 5:00 PM

## 2023-06-17 ENCOUNTER — Ambulatory Visit (INDEPENDENT_AMBULATORY_CARE_PROVIDER_SITE_OTHER): Payer: Self-pay | Admitting: Urology

## 2023-06-17 ENCOUNTER — Ambulatory Visit (HOSPITAL_COMMUNITY)
Admission: RE | Admit: 2023-06-17 | Discharge: 2023-06-17 | Disposition: A | Discharge status: Home / Self Care | Payer: Self-pay | Source: Ambulatory Visit | Attending: Urology | Admitting: Urology

## 2023-06-17 ENCOUNTER — Other Ambulatory Visit: Payer: Self-pay | Admitting: Physician Assistant

## 2023-06-17 ENCOUNTER — Encounter: Payer: Self-pay | Admitting: Urology

## 2023-06-17 VITALS — BP 113/75 | HR 82 | Temp 98.0°F

## 2023-06-17 DIAGNOSIS — R1030 Lower abdominal pain, unspecified: Secondary | ICD-10-CM

## 2023-06-17 DIAGNOSIS — N200 Calculus of kidney: Secondary | ICD-10-CM | POA: Insufficient documentation

## 2023-06-17 DIAGNOSIS — R3989 Other symptoms and signs involving the genitourinary system: Secondary | ICD-10-CM

## 2023-06-17 DIAGNOSIS — R109 Unspecified abdominal pain: Secondary | ICD-10-CM

## 2023-06-18 LAB — URINALYSIS, ROUTINE W REFLEX MICROSCOPIC
Bilirubin, UA: NEGATIVE
Glucose, UA: NEGATIVE
Ketones, UA: NEGATIVE
Leukocytes,UA: NEGATIVE
Nitrite, UA: NEGATIVE
Specific Gravity, UA: 1.01 (ref 1.005–1.030)
Urobilinogen, Ur: 1 mg/dL (ref 0.2–1.0)
pH, UA: 7 (ref 5.0–7.5)

## 2023-06-18 LAB — MICROSCOPIC EXAMINATION: Bacteria, UA: NONE SEEN

## 2023-07-03 ENCOUNTER — Ambulatory Visit (INDEPENDENT_AMBULATORY_CARE_PROVIDER_SITE_OTHER): Payer: Self-pay | Admitting: Neurology

## 2023-07-03 DIAGNOSIS — G5603 Carpal tunnel syndrome, bilateral upper limbs: Secondary | ICD-10-CM

## 2023-07-03 DIAGNOSIS — R202 Paresthesia of skin: Secondary | ICD-10-CM

## 2023-07-03 DIAGNOSIS — M79601 Pain in right arm: Secondary | ICD-10-CM

## 2023-07-03 DIAGNOSIS — M79602 Pain in left arm: Secondary | ICD-10-CM

## 2023-07-03 NOTE — Procedures (Signed)
[facility]rology  [address] [place], [address]  [place], [place] [address] Tel: [phone] Fax: [phone] Test Date:  07/03/2023  Patient: Sylvia Nguyen DOB: Jan 21, 1971 Physician: Nita Sickle, DO  Sex: Female Height: 5\' 2"  Ref Phys: Nita Sickle, DO  ID#: [id number]   Technician:    History: This is a 52 year old female referred for evaluation of bilateral arm paresthesias.  NCV & EMG Findings: Extensive electrodiagnostic testing of the right upper extremity and additional studies of the left shows:  Right median sensory response shows prolonged latency (4.0 ms) and reduced amplitude (9.5 V).  Left mixed palmar sensory responses show prolonged latency.  Right median and bilateral ulnar sensory responses are within normal limits. Right median motor response shows prolonged latency (4.1 ms).  Left median and bilateral ulnar motor responses are within normal limits.   There is no evidence of active or chronic motor axonal loss changes affecting any of the tested muscles.  Motor unit configuration and recruitment pattern is within normal limits.    Impression: Bilateral median neuropathy at or distal to the wrist, consistent with a clinical diagnosis of carpal tunnel syndrome.  Overall, these findings are moderate on the right and very mild on the left. There is no evidence of a cervical radiculopathy affecting either upper extremity.   ___________________________ Nita Sickle, DO    Nerve Conduction Studies   Stim Site NR Peak (ms) Norm Peak (ms) O-P Amp (V) Norm O-P Amp  Left Median Anti Sensory (2nd Digit)  32 C  Wrist    3.0 <3.6 37.0 >15  Right Median Anti Sensory (2nd Digit)  32 C  Wrist    *4.0 <3.6 *9.5 >15  Left Ulnar Anti Sensory (5th Digit)  32 C  Wrist    2.6 <3.1 40.7 >10  Right Ulnar Anti Sensory (5th Digit)  32 C  Wrist    2.3 <3.1 40.2 >10     Stim Site NR Onset (ms) Norm Onset (ms) O-P Amp (mV) Norm O-P Amp Site1 Site2 Delta-0 (ms) Dist (cm)  Vel (m/s) Norm Vel (m/s)  Left Median Motor (Abd Poll Brev)  32 C  Wrist    3.2 <4.0 13.7 >6 Elbow Wrist 4.6 28.0 61 >50  Elbow    7.8  13.1         Right Median Motor (Abd Poll Brev)  32 C  Wrist    *4.1 <4.0 10.4 >6 Elbow Wrist 4.7 27.0 57 >50  Elbow    8.8  10.3         Left Ulnar Motor (Abd Dig Minimi)  32 C  Wrist    2.4 <3.1 10.4 >7 B Elbow Wrist 3.1 20.0 65 >50  B Elbow    5.5  10.1  A Elbow B Elbow 1.7 10.0 59 >50  A Elbow    7.2  9.5         Right Ulnar Motor (Abd Dig Minimi)  32 C  Wrist    2.3 <3.1 10.7 >7 B Elbow Wrist 2.9 20.0 69 >50  B Elbow    5.2  10.8  A Elbow B Elbow 1.5 10.0 67 >50  A Elbow    6.7  10.4            Stim Site NR Peak (ms) Norm Peak (ms) P-T Amp (V) Site1 Site2 Delta-P (ms) Norm Delta (ms)  Left Median/Ulnar Palm Comparison (Wrist - 8cm)  32 C  Median Palm    1.8 <2.2 87.2 Median  Palm Ulnar Palm *0.4   Ulnar Palm    1.4 <2.2 19.5       Electromyography   Side Muscle Ins.Act Fibs Fasc Recrt Amp Dur Poly Activation Comment  Right 1stDorInt Nml Nml Nml Nml Nml Nml Nml Nml N/A  Right Abd Poll Brev Nml Nml Nml Nml Nml Nml Nml Nml N/A  Right PronatorTeres Nml Nml Nml Nml Nml Nml Nml Nml N/A  Right Biceps Nml Nml Nml Nml Nml Nml Nml Nml N/A  Right Triceps Nml Nml Nml Nml Nml Nml Nml Nml N/A  Right Deltoid Nml Nml Nml Nml Nml Nml Nml Nml N/A  Left 1stDorInt Nml Nml Nml Nml Nml Nml Nml Nml N/A  Left Abd Poll Brev Nml Nml Nml Nml Nml Nml Nml Nml N/A  Left PronatorTeres Nml Nml Nml Nml Nml Nml Nml Nml N/A  Left Biceps Nml Nml Nml Nml Nml Nml Nml Nml N/A  Left Triceps Nml Nml Nml Nml Nml Nml Nml Nml N/A  Left Deltoid Nml Nml Nml Nml Nml Nml Nml Nml N/A      Waveforms:

## 2023-07-08 ENCOUNTER — Other Ambulatory Visit: Payer: Self-pay | Admitting: Nurse Practitioner

## 2023-07-08 DIAGNOSIS — Z1231 Encounter for screening mammogram for malignant neoplasm of breast: Secondary | ICD-10-CM

## 2023-07-11 ENCOUNTER — Telehealth: Payer: Self-pay | Admitting: Neurology

## 2023-07-11 NOTE — Telephone Encounter (Signed)
Pt states that she is returning a call to go over the test results

## 2023-07-11 NOTE — Telephone Encounter (Signed)
I advised results are not available at this time.

## 2023-07-21 ENCOUNTER — Encounter (HOSPITAL_COMMUNITY): Payer: Self-pay

## 2023-07-21 ENCOUNTER — Ambulatory Visit (HOSPITAL_COMMUNITY)
Admission: RE | Admit: 2023-07-21 | Discharge: 2023-07-21 | Disposition: A | Discharge status: Home / Self Care | Payer: Self-pay | Source: Ambulatory Visit | Attending: Nurse Practitioner | Admitting: Nurse Practitioner

## 2023-07-21 DIAGNOSIS — Z1231 Encounter for screening mammogram for malignant neoplasm of breast: Secondary | ICD-10-CM | POA: Insufficient documentation

## 2023-07-23 ENCOUNTER — Encounter: Payer: Self-pay | Admitting: Emergency Medicine

## 2023-07-23 ENCOUNTER — Ambulatory Visit
Admission: EM | Admit: 2023-07-23 | Discharge: 2023-07-23 | Disposition: A | Discharge status: Home / Self Care | Payer: Self-pay | Attending: Family Medicine | Admitting: Family Medicine

## 2023-07-23 DIAGNOSIS — J309 Allergic rhinitis, unspecified: Secondary | ICD-10-CM

## 2023-07-23 MED ORDER — CETIRIZINE HCL 10 MG PO TABS: 10.0000 mg | ORAL_TABLET | Freq: Two times a day (BID) | ORAL | 0 refills | Status: DC

## 2023-07-23 MED ORDER — PREDNISONE 20 MG PO TABS: 40.0000 mg | ORAL_TABLET | Freq: Every day | ORAL | 0 refills | Status: DC

## 2023-07-23 MED ORDER — FLUTICASONE PROPIONATE 50 MCG/ACT NA SUSP: 1.0000 | Freq: Two times a day (BID) | NASAL | 2 refills | Status: DC

## 2023-07-23 NOTE — ED Triage Notes (Signed)
Nasal congestion x 3 to 4 months.  States she thinks her upper lip is swollen.  Has tried allegra to help with symptoms with some relief.

## 2023-07-23 NOTE — ED Notes (Signed)
Also states eyes itch

## 2023-07-23 NOTE — ED Provider Notes (Signed)
[facility]    CSN: [id number] Arrival date & time: 07/23/23  1652      History   Chief Complaint No chief complaint on file.   HPI Sylvia Nguyen is a 52 y.o. female.   Presenting today with several months of persistent significant nasal congestion.  Also having some chest tightness occasionally.  Denies fever, chills, cough, chest pain, shortness of breath, abdominal pain, nausea vomiting or diarrhea.  Ran out of her antihistamines 2 days ago and feels like her eyes and her lip is a bit swollen.  No new exposures, foods, medications, sick contacts.  Trying antihistamines with good relief temporarily.    Past Medical History:  Diagnosis Date   GERD (gastroesophageal reflux disease)    Hyperlipidemia    Hypertension    Nephrolithiasis    Uterine fibroid     Patient Active Problem List   Diagnosis Date Noted   Nephrolithiasis    Polyp of rectum    Essential hypertension 09/23/2017    Past Surgical History:  Procedure Laterality Date   COLONOSCOPY N/A 03/30/2020   Dr. Darrick Penna: Tortuous left colon, 3 mm polyp removed benign.  Internal/external hemorrhoids Next colonoscopy in 10 years   POLYPECTOMY  03/30/2020   Procedure: POLYPECTOMY;  Surgeon: West Bali, MD;  Location: [facility];  Service: Endoscopy;;  rectal    OB History     Gravida  4   Para  4   Term  4   Preterm      AB      Living  4      SAB      IAB      Ectopic      Multiple      Live Births               Home Medications    Prior to Admission medications   Medication Sig Start Date End Date Taking? Authorizing Provider  cetirizine (ZYRTEC ALLERGY) 10 MG tablet Take 1 tablet (10 mg total) by mouth 2 (two) times daily. 07/23/23  Yes Particia Nearing, PA-C  fluticasone [facility]) 50 MCG/ACT nasal spray Place 1 spray into both nostrils 2 (two) times daily. 07/23/23  Yes Particia Nearing, PA-C  predniSONE (DELTASONE) 20 MG tablet Take 2 tablets (40 mg  total) by mouth daily with breakfast. 07/23/23  Yes Particia Nearing, PA-C  amLODipine (NORVASC) 10 MG tablet Take 1 tablet (10 mg total) by mouth daily. Tome una tableta por boca diaria 05/20/23   Jacquelin Hawking, PA-C  Fexofenadine HCl (ALLEGRA ALLERGY PO) Take by mouth daily as needed. prn    [provider]  potassium chloride (KLOR-CON M) 10 MEQ tablet 3 po every day.  Tome 3 tabletas por boca diaria 06/17/23   Jacquelin Hawking, PA-C  potassium chloride (KLOR-CON) 10 MEQ tablet Take 3 tablets (30 mEq total) by mouth daily. 05/20/23   Jacquelin Hawking, PA-C  tamsulosin (FLOMAX) 0.4 MG CAPS capsule Take 1 capsule (0.4 mg total) by mouth daily after supper. 03/12/23   McKenzie, Mardene Celeste, MD    Family History Family History  Problem Relation Age of Onset   Other Father        MVA   Colon cancer Neg Hx     Social History Social History   Tobacco Use   Smoking status: Never   Smokeless tobacco: Never  Vaping Use   Vaping status: Never Used  Substance Use Topics   Alcohol use: No  Drug use: No     Allergies   Megavite fruits & veggies [actical]   Review of Systems Review of Systems Per HPI  Physical Exam Triage Vital Signs ED Triage Vitals  Encounter Vitals Group     BP 07/23/23 1725 137/83     Systolic BP Percentile --      Diastolic BP Percentile --      Pulse Rate 07/23/23 1725 75     Resp 07/23/23 1725 16     Temp 07/23/23 1725 98.4 F (36.9 C)     Temp Source 07/23/23 1725 Oral     SpO2 07/23/23 1725 97 %     Weight --      Height --      Head Circumference --      Peak Flow --      Pain Score 07/23/23 1726 0     Pain Loc --      Pain Education --      Exclude from Growth Chart --    No data found.  Updated Vital Signs BP 137/83 (BP Location: Right Arm)   Pulse 75   Temp 98.4 F (36.9 C) (Oral)   Resp 16   SpO2 97%   Visual Acuity Right Eye Distance:   Left Eye Distance:   Bilateral Distance:    Right Eye Near:   Left Eye  Near:    Bilateral Near:     Physical Exam Vitals and nursing note reviewed.  Constitutional:      Appearance: Normal appearance.  HENT:     Head: Atraumatic.     Right Ear: Tympanic membrane and external ear normal.     Left Ear: Tympanic membrane and external ear normal.     Nose: Congestion present.     Mouth/Throat:     Mouth: Mucous membranes are moist.     Pharynx: Posterior oropharyngeal erythema present.  Eyes:     Extraocular Movements: Extraocular movements intact.     Conjunctiva/sclera: Conjunctivae normal.  Cardiovascular:     Rate and Rhythm: Normal rate and regular rhythm.     Heart sounds: Normal heart sounds.  Pulmonary:     Effort: Pulmonary effort is normal.     Breath sounds: Normal breath sounds. No wheezing or rales.  Musculoskeletal:        General: Normal range of motion.     Cervical back: Normal range of motion and neck supple.  Skin:    General: Skin is warm and dry.  Neurological:     Mental Status: She is alert and oriented to person, place, and time.  Psychiatric:        Mood and Affect: Mood normal.        Thought Content: Thought content normal.      UC Treatments / Results  Labs (all labs ordered are listed, but only abnormal results are displayed) Labs Reviewed - No data to display  EKG   Radiology No results found.  Procedures Procedures (including critical care time)  Medications Ordered in UC Medications - No data to display  Initial Impression / Assessment and Plan / UC Course  I have reviewed the triage vital signs and the nursing notes.  Pertinent labs & imaging results that were available during my care of the patient were reviewed by me and considered in my medical decision making (see chart for details).     Consistent with uncontrolled seasonal allergies.  Treat current exacerbation with prednisone and twice daily Zyrtec and  Flonase until symptoms improve.  Then may back down to once daily on the Zyrtec.   Discussed supportive home care and return precautions.  Final Clinical Impressions(s) / UC Diagnoses   Final diagnoses:  Allergic sinusitis   Discharge Instructions   None    ED Prescriptions     Medication Sig Dispense Auth. Provider   cetirizine (ZYRTEC ALLERGY) 10 MG tablet Take 1 tablet (10 mg total) by mouth 2 (two) times daily. 60 tablet Particia Nearing, PA-C   fluticasone [facility]) 50 MCG/ACT nasal spray Place 1 spray into both nostrils 2 (two) times daily. 16 g Particia Nearing, [place]   predniSONE (DELTASONE) 20 MG tablet Take 2 tablets (40 mg total) by mouth daily with breakfast. 10 tablet Particia Nearing, [place]      PDMP not reviewed this encounter.   Particia Nearing, [place] 07/23/23 1754

## 2023-07-28 ENCOUNTER — Other Ambulatory Visit: Payer: Self-pay | Admitting: Physician Assistant

## 2023-07-28 DIAGNOSIS — R7989 Other specified abnormal findings of blood chemistry: Secondary | ICD-10-CM

## 2023-07-28 DIAGNOSIS — I10 Essential (primary) hypertension: Secondary | ICD-10-CM

## 2023-07-28 DIAGNOSIS — E876 Hypokalemia: Secondary | ICD-10-CM

## 2023-07-28 DIAGNOSIS — E785 Hyperlipidemia, unspecified: Secondary | ICD-10-CM

## 2023-08-18 ENCOUNTER — Other Ambulatory Visit (HOSPITAL_COMMUNITY)
Admission: RE | Admit: 2023-08-18 | Discharge: 2023-08-18 | Disposition: A | Discharge status: Home / Self Care | Payer: Self-pay | Source: Ambulatory Visit | Attending: Physician Assistant | Admitting: Physician Assistant

## 2023-08-18 DIAGNOSIS — I10 Essential (primary) hypertension: Secondary | ICD-10-CM | POA: Insufficient documentation

## 2023-08-18 DIAGNOSIS — R7989 Other specified abnormal findings of blood chemistry: Secondary | ICD-10-CM | POA: Insufficient documentation

## 2023-08-18 DIAGNOSIS — E785 Hyperlipidemia, unspecified: Secondary | ICD-10-CM | POA: Insufficient documentation

## 2023-08-18 DIAGNOSIS — E876 Hypokalemia: Secondary | ICD-10-CM | POA: Insufficient documentation

## 2023-08-18 LAB — COMPREHENSIVE METABOLIC PANEL
ALT: 21 U/L (ref 0–44)
AST: 22 U/L (ref 15–41)
Albumin: 4.2 g/dL (ref 3.5–5.0)
Alkaline Phosphatase: 115 U/L (ref 38–126)
Anion gap: 9 (ref 5–15)
BUN: 14 mg/dL (ref 6–20)
CO2: 27 mmol/L (ref 22–32)
Calcium: 9.1 mg/dL (ref 8.9–10.3)
Chloride: 105 mmol/L (ref 98–111)
Creatinine, Ser: 0.63 mg/dL (ref 0.44–1.00)
GFR, Estimated: >60 mL/min (ref >60)
Glucose, Bld: 101 mg/dL — ABNORMAL HIGH (ref 70–99)
Potassium: 3.1 mmol/L — ABNORMAL LOW (ref 3.5–5.1)
Sodium: 141 mmol/L (ref 135–145)
Total Bilirubin: 0.9 mg/dL (ref 0.3–1.2)
Total Protein: 7.7 g/dL (ref 6.5–8.1)

## 2023-08-18 LAB — LIPID PANEL
Cholesterol: 192 mg/dL (ref 0–200)
HDL: 51 mg/dL (ref >40)
LDL Cholesterol: 118 mg/dL — ABNORMAL HIGH (ref 0–99)
Total CHOL/HDL Ratio: 3.8 RATIO
Triglycerides: 113 mg/dL (ref <150)
VLDL: 23 mg/dL (ref 0–40)

## 2023-08-19 ENCOUNTER — Ambulatory Visit (HOSPITAL_COMMUNITY)
Admission: RE | Admit: 2023-08-19 | Discharge: 2023-08-19 | Disposition: A | Discharge status: Home / Self Care | Payer: Self-pay | Source: Ambulatory Visit | Attending: Urology | Admitting: Urology

## 2023-08-19 DIAGNOSIS — R10A3 Flank pain, bilateral: Secondary | ICD-10-CM

## 2023-08-19 DIAGNOSIS — R1030 Lower abdominal pain, unspecified: Secondary | ICD-10-CM

## 2023-08-19 DIAGNOSIS — N200 Calculus of kidney: Secondary | ICD-10-CM

## 2023-08-19 DIAGNOSIS — R109 Unspecified abdominal pain: Secondary | ICD-10-CM | POA: Insufficient documentation

## 2023-08-20 ENCOUNTER — Encounter: Payer: Self-pay | Admitting: Physician Assistant

## 2023-08-20 ENCOUNTER — Other Ambulatory Visit: Payer: Self-pay | Admitting: Urology

## 2023-08-20 ENCOUNTER — Ambulatory Visit: Payer: Self-pay | Admitting: Physician Assistant

## 2023-08-20 VITALS — BP 112/70 | HR 65 | Temp 98.2°F | Ht 62.0 in | Wt 136.0 lb

## 2023-08-20 DIAGNOSIS — E876 Hypokalemia: Secondary | ICD-10-CM

## 2023-08-20 DIAGNOSIS — Z789 Other specified health status: Secondary | ICD-10-CM

## 2023-08-20 DIAGNOSIS — I10 Essential (primary) hypertension: Secondary | ICD-10-CM

## 2023-08-20 MED ORDER — AMLODIPINE BESYLATE 10 MG PO TABS: 10.0000 mg | ORAL_TABLET | Freq: Every day | ORAL | 0 refills | Status: DC

## 2023-08-20 MED ORDER — POTASSIUM CHLORIDE CRYS ER 10 MEQ PO TBCR: EXTENDED_RELEASE_TABLET | ORAL | 0 refills | Status: DC

## 2023-08-20 NOTE — Progress Notes (Unsigned)
BP 112/70   Pulse 65   Temp 98.2 F (36.8 C)   Ht 5\' 2"  (1.575 m)   Wt 136 lb (61.7 kg)   SpO2 98%   BMI 24.87 kg/m    Subjective:    Patient ID: Sylvia Nguyen, female    DOB: [birth year], 52 y.o.   MRN: [id number]  HPI: Sylvia Nguyen is a 52 y.o. female presenting on 08/20/2023 for Hypertension   HPI   Chief Complaint  Patient presents with   Hypertension   Pt is in for routine follow up.     Relevant past medical, surgical, family and social history reviewed and updated as indicated. Interim medical history since our last visit reviewed. Allergies and medications reviewed and updated.   Current Outpatient Medications:    amLODipine (NORVASC) 10 MG tablet, Take 1 tablet (10 mg total) by mouth daily. Tome una tableta por boca diaria, Disp: 30 tablet, Rfl: 1   cetirizine (ZYRTEC ALLERGY) 10 MG tablet, Take 1 tablet (10 mg total) by mouth 2 (two) times daily., Disp: 60 tablet, Rfl: 0   fluticasone (FLONASE) 50 MCG/ACT nasal spray, Place 1 spray into both nostrils 2 (two) times daily., Disp: 16 g, Rfl: 2   potassium chloride (KLOR-CON M) 10 MEQ tablet, 3 po every day.  Tome 3 tabletas por boca diaria, Disp: 90 tablet, Rfl: 0   tamsulosin (FLOMAX) 0.4 MG CAPS capsule, Take 1 capsule (0.4 mg total) by mouth daily after supper., Disp: 30 capsule, Rfl: 11   Fexofenadine HCl (ALLEGRA ALLERGY PO), Take by mouth daily as needed. prn (Patient not taking: Reported on 08/20/2023), Disp: , Rfl:      Review of Systems  Per HPI unless specifically indicated above     Objective:    BP 112/70   Pulse 65   Temp 98.2 F (36.8 C)   Ht 5\' 2"  (1.575 m)   Wt 136 lb (61.7 kg)   SpO2 98%   BMI 24.87 kg/m   Wt Readings from Last 3 Encounters:  08/20/23 136 lb (61.7 kg)  05/28/23 141 lb (64 kg)  05/20/23 144 lb 8 oz (65.5 kg)    Physical Exam Vitals reviewed.  Constitutional:      General: She is not in acute distress.    Appearance: She is well-developed. She is not  toxic-appearing.  HENT:     Head: Normocephalic and atraumatic.  Cardiovascular:     Rate and Rhythm: Normal rate and regular rhythm.  Pulmonary:     Effort: Pulmonary effort is normal.     Breath sounds: Normal breath sounds.  Abdominal:     General: Bowel sounds are normal.     Palpations: Abdomen is soft. There is no mass.     Tenderness: There is no abdominal tenderness.  Musculoskeletal:     Cervical back: Neck supple.     Right lower leg: No edema.     Left lower leg: No edema.  Lymphadenopathy:     Cervical: No cervical adenopathy.  Skin:    General: Skin is warm and dry.  Neurological:     Mental Status: She is alert and oriented to person, place, and time.  Psychiatric:        Behavior: Behavior normal.        Results for orders placed or performed during the hospital encounter of 08/18/23  Comprehensive metabolic panel  Result Value Ref Range   Sodium 141 135 - 145 mmol/L   Potassium 3.1 (L)  3.5 - 5.1 mmol/L   Chloride 105 98 - 111 mmol/L   CO2 27 22 - 32 mmol/L   Glucose, Bld 101 (H) 70 - 99 mg/dL   BUN 14 6 - 20 mg/dL   Creatinine, Ser 8.29 0.44 - 1.00 mg/dL   Calcium 9.1 8.9 - 56.2 mg/dL   Total Protein 7.7 6.5 - 8.1 g/dL   Albumin 4.2 3.5 - 5.0 g/dL   AST 22 15 - 41 U/L   ALT 21 0 - 44 U/L   Alkaline Phosphatase 115 38 - 126 U/L   Total Bilirubin 0.9 0.3 - 1.2 mg/dL   GFR, Estimated >13 >08 mL/min   Anion gap 9 5 - 15  Lipid panel  Result Value Ref Range   Cholesterol 192 0 - 200 mg/dL   Triglycerides 657 <846 mg/dL   HDL 51 >96 mg/dL   Total CHOL/HDL Ratio 3.8 RATIO   VLDL 23 0 - 40 mg/dL   LDL Cholesterol 295 (H) 0 - 99 mg/dL      Assessment & Plan:   Encounter Diagnoses  Name Primary?   Primary hypertension Yes   Hypokalemia    Not proficient in English language       -Reviewed labs with pt -pt to increase K+ to bid x 1 week then resume qd -she is to continue amlodipine for HTN -she is encouraged to follow lowfat diet for  lipids -pt to follow up 3 months. She is to contact office sooner prn

## 2023-08-25 ENCOUNTER — Other Ambulatory Visit: Payer: Self-pay | Admitting: Physician Assistant

## 2023-08-26 ENCOUNTER — Telehealth: Payer: Self-pay

## 2023-08-26 NOTE — Telephone Encounter (Signed)
-----   Message from Donnita [place] sent at 08/26/2023 11:05 AM EDT ----- Please let pt know that CT showed small bilateral non-obstructing kidney stones (up to 5 mm on right side and up to 2 mm on left side). Advised to schedule f/u here with [facility] for stone surveillance in 6 months.  She is advised to reschedule cystoscopy with Dr. Ronne Binning for further evaluation of bladder pain x2 years (she was a no show for that on 08/20/2023).

## 2023-08-26 NOTE — Telephone Encounter (Signed)
[id number] [employer] Language line-tried calling patient with no answer, left vm for return call to office

## 2023-09-04 ENCOUNTER — Telehealth: Payer: Self-pay | Admitting: Student

## 2023-09-04 ENCOUNTER — Other Ambulatory Visit: Payer: Self-pay | Admitting: Physician Assistant

## 2023-09-04 DIAGNOSIS — E876 Hypokalemia: Secondary | ICD-10-CM

## 2023-09-04 NOTE — Telephone Encounter (Signed)
LPN called and lvm instructing pt to go to [facility] one day next week (week of 09-08-23) to recheck K+. Will attempt contacting pt again at a later time.

## 2023-09-10 NOTE — Telephone Encounter (Signed)
Pt's K+ has not been resulted - pt has not gone to get her blood drawn. LPN called and LVM again today, 09-10-23 to remind her to get this done.

## 2023-09-11 ENCOUNTER — Other Ambulatory Visit (HOSPITAL_COMMUNITY)
Admission: RE | Admit: 2023-09-11 | Discharge: 2023-09-11 | Disposition: A | Discharge status: Home / Self Care | Payer: Self-pay | Source: Ambulatory Visit | Attending: Physician Assistant | Admitting: Physician Assistant

## 2023-09-11 DIAGNOSIS — E876 Hypokalemia: Secondary | ICD-10-CM | POA: Insufficient documentation

## 2023-09-11 LAB — POTASSIUM: Potassium: 3.3 mmol/L — ABNORMAL LOW (ref 3.5–5.1)

## 2023-09-23 ENCOUNTER — Other Ambulatory Visit: Payer: Self-pay | Admitting: Physician Assistant

## 2023-09-23 DIAGNOSIS — E876 Hypokalemia: Secondary | ICD-10-CM

## 2023-11-05 ENCOUNTER — Other Ambulatory Visit: Payer: Self-pay | Admitting: Physician Assistant

## 2023-11-05 ENCOUNTER — Telehealth: Payer: Self-pay | Admitting: Student

## 2023-11-05 DIAGNOSIS — I10 Essential (primary) hypertension: Secondary | ICD-10-CM

## 2023-11-05 DIAGNOSIS — E876 Hypokalemia: Secondary | ICD-10-CM

## 2023-11-05 NOTE — Telephone Encounter (Signed)
LPN called and spoke with patient notifying her she is 3 weeks past due to recheck her K+ levels. Pt was urged to get her nonfasting labs drawn ASAP. Pt verbalized understanding and states she will go today or tomorrow.

## 2023-11-07 ENCOUNTER — Other Ambulatory Visit (HOSPITAL_COMMUNITY)
Admission: RE | Admit: 2023-11-07 | Discharge: 2023-11-07 | Disposition: A | Discharge status: Home / Self Care | Payer: Self-pay | Source: Ambulatory Visit | Attending: Physician Assistant | Admitting: Physician Assistant

## 2023-11-07 DIAGNOSIS — I10 Essential (primary) hypertension: Secondary | ICD-10-CM | POA: Insufficient documentation

## 2023-11-07 DIAGNOSIS — E876 Hypokalemia: Secondary | ICD-10-CM | POA: Insufficient documentation

## 2023-11-07 LAB — BASIC METABOLIC PANEL
Anion gap: 10 (ref 5–15)
BUN: 11 mg/dL (ref 6–20)
CO2: 28 mmol/L (ref 22–32)
Calcium: 9 mg/dL (ref 8.9–10.3)
Chloride: 101 mmol/L (ref 98–111)
Creatinine, Ser: 0.64 mg/dL (ref 0.44–1.00)
GFR, Estimated: >60 mL/min (ref >60)
Glucose, Bld: 117 mg/dL — ABNORMAL HIGH (ref 70–99)
Potassium: 3.5 mmol/L (ref 3.5–5.1)
Sodium: 139 mmol/L (ref 135–145)

## 2023-11-18 ENCOUNTER — Ambulatory Visit: Payer: Self-pay | Admitting: Physician Assistant

## 2023-11-18 ENCOUNTER — Encounter: Payer: Self-pay | Admitting: Physician Assistant

## 2023-11-18 VITALS — BP 119/71 | HR 69 | Temp 98.0°F | Ht 62.0 in | Wt 142.0 lb

## 2023-11-18 DIAGNOSIS — E876 Hypokalemia: Secondary | ICD-10-CM

## 2023-11-18 DIAGNOSIS — I10 Essential (primary) hypertension: Secondary | ICD-10-CM

## 2023-11-18 DIAGNOSIS — Z789 Other specified health status: Secondary | ICD-10-CM

## 2023-11-18 DIAGNOSIS — Z2821 Immunization not carried out because of patient refusal: Secondary | ICD-10-CM

## 2023-11-18 MED ORDER — POTASSIUM CHLORIDE CRYS ER 10 MEQ PO TBCR: EXTENDED_RELEASE_TABLET | ORAL | 0 refills | Status: DC

## 2023-11-18 MED ORDER — AMLODIPINE BESYLATE 10 MG PO TABS: 10.0000 mg | ORAL_TABLET | Freq: Every day | ORAL | 0 refills | Status: DC

## 2023-11-18 NOTE — Progress Notes (Signed)
BP 119/71   Pulse 69   Temp 98 F (36.7 C)   Ht 5\' 2"  (1.575 m)   Wt 142 lb (64.4 kg)   SpO2 99%   BMI 25.97 kg/m    Subjective:    Patient ID: Sylvia Nguyen, female    DOB: 01/01/1971, 52 y.o.   MRN: [id number]  HPI: Sylvia Nguyen is a 52 y.o. female presenting on 11/18/2023 for Hypertension   HPI  Chief Complaint  Patient presents with   Hypertension    Pt does not report cp or sob   Relevant past medical, surgical, family and social history reviewed and updated as indicated. Interim medical history since our last visit reviewed. Allergies and medications reviewed and updated.   Current Outpatient Medications:    cetirizine (ZYRTEC ALLERGY) 10 MG tablet, Take 1 tablet (10 mg total) by mouth 2 (two) times daily. (Patient taking differently: Take 10 mg by mouth daily as needed for allergies.), Disp: 60 tablet, Rfl: 0   amLODipine (NORVASC) 10 MG tablet, Take 1 tablet (10 mg total) by mouth daily. Tome una tableta por boca diaria, Disp: 90 tablet, Rfl: 0   potassium chloride (KLOR-CON M) 10 MEQ tablet, 2 po bid .  Tome dos tabletas por [employer] veces diarias, Disp: 360 tablet, Rfl: 0    Review of Systems  Per HPI unless specifically indicated above     Objective:    BP 119/71   Pulse 69   Temp 98 F (36.7 C)   Ht 5\' 2"  (1.575 m)   Wt 142 lb (64.4 kg)   SpO2 99%   BMI 25.97 kg/m   Wt Readings from Last 3 Encounters:  11/18/23 142 lb (64.4 kg)  08/20/23 136 lb (61.7 kg)  05/28/23 141 lb (64 kg)    Physical Exam Vitals reviewed.  Constitutional:      General: She is not in acute distress.    Appearance: She is well-developed. She is not toxic-appearing.  HENT:     Head: Normocephalic and atraumatic.  Cardiovascular:     Rate and Rhythm: Normal rate and regular rhythm.  Pulmonary:     Effort: Pulmonary effort is normal.     Breath sounds: Normal breath sounds.  Abdominal:     General: Bowel sounds are normal.     Palpations: Abdomen is soft.  There is no mass.     Tenderness: There is no abdominal tenderness.  Musculoskeletal:     Cervical back: Neck supple.     Right lower leg: No edema.     Left lower leg: No edema.  Lymphadenopathy:     Cervical: No cervical adenopathy.  Skin:    General: Skin is warm and dry.  Neurological:     Mental Status: She is alert and oriented to person, place, and time.  Psychiatric:        Behavior: Behavior normal.     Results for orders placed or performed during the hospital encounter of 11/07/23  Basic metabolic panel  Result Value Ref Range   Sodium 139 135 - 145 mmol/L   Potassium 3.5 3.5 - 5.1 mmol/L   Chloride 101 98 - 111 mmol/L   CO2 28 22 - 32 mmol/L   Glucose, Bld 117 (H) 70 - 99 mg/dL   BUN 11 6 - 20 mg/dL   Creatinine, Ser 3.08 0.44 - 1.00 mg/dL   Calcium 9.0 8.9 - 65.7 mg/dL   GFR, Estimated >84 >69 mL/min   Anion  gap 10 5 - 15      Assessment & Plan:   Encounter Diagnoses  Name Primary?   Primary hypertension Yes   Hypokalemia    Not proficient in English language    COVID-19 vaccination declined    Influenza vaccination declined      -reviewed labs with pt -Flu and Covid vax declined by pt -pt to continue amlodipine 10mg  as prescribed -pt to Continue K+ 2 tab bid as she has been taking it -pt to follow up3 months.  She is to contact office sooner prn

## 2023-11-19 ENCOUNTER — Ambulatory Visit: Payer: Self-pay | Admitting: Physician Assistant

## 2023-12-02 ENCOUNTER — Ambulatory Visit
Admission: EM | Admit: 2023-12-02 | Discharge: 2023-12-02 | Disposition: A | Discharge status: Home / Self Care | Payer: Self-pay | Attending: Family Medicine | Admitting: Family Medicine

## 2023-12-02 ENCOUNTER — Other Ambulatory Visit: Payer: Self-pay

## 2023-12-02 ENCOUNTER — Encounter: Payer: Self-pay | Admitting: Emergency Medicine

## 2023-12-02 DIAGNOSIS — N898 Other specified noninflammatory disorders of vagina: Secondary | ICD-10-CM | POA: Insufficient documentation

## 2023-12-02 LAB — POCT URINALYSIS DIP (MANUAL ENTRY)
Bilirubin, UA: NEGATIVE
Blood, UA: NEGATIVE
Glucose, UA: NEGATIVE mg/dL
Ketones, POC UA: NEGATIVE mg/dL
Leukocytes, UA: NEGATIVE
Nitrite, UA: NEGATIVE
Protein Ur, POC: NEGATIVE mg/dL
Spec Grav, UA: 1.02 (ref 1.010–1.025)
Urobilinogen, UA: 1 U/dL
pH, UA: 7 (ref 5.0–8.0)

## 2023-12-02 NOTE — Discharge Instructions (Signed)
Your urine testing was normal today.  We have sent out a vaginal swab for further evaluation and we will let you know if anything comes back positive and discuss treatment at that time.  Avoid any scented or irritating soaps in this area as well as feminine wipes and washes, douches.

## 2023-12-02 NOTE — ED Triage Notes (Addendum)
Interpreter used for triage.   Pt reports vaginal itching and vaginal pain for last several days. Pt reports urinary frequency, lower back pain, denies dysuria or vaginal discharge. Pt reports would like to be tested for STD "just in case".     Pt attempting to obtain urine sample and cytology sample post triage.

## 2023-12-03 LAB — CERVICOVAGINAL ANCILLARY ONLY
Bacterial Vaginitis (gardnerella): POSITIVE — AB
Candida Glabrata: NEGATIVE
Candida Vaginitis: NEGATIVE
Chlamydia: NEGATIVE
Comment: NEGATIVE
Comment: NEGATIVE
Comment: NEGATIVE
Comment: NEGATIVE
Comment: NEGATIVE
Comment: NORMAL
Neisseria Gonorrhea: NEGATIVE
Trichomonas: NEGATIVE

## 2023-12-05 ENCOUNTER — Telehealth (HOSPITAL_COMMUNITY): Payer: Self-pay

## 2023-12-05 MED ORDER — METRONIDAZOLE 500 MG PO TABS: 500.0000 mg | ORAL_TABLET | Freq: Two times a day (BID) | ORAL | 0 refills | Status: AC

## 2023-12-05 MED ORDER — METRONIDAZOLE 500 MG PO TABS: 500.0000 mg | ORAL_TABLET | Freq: Two times a day (BID) | ORAL | 0 refills | Status: DC

## 2023-12-05 NOTE — ED Provider Notes (Signed)
[facility]    CSN: [id number] Arrival date & time: 12/02/23  1903      History   Chief Complaint No chief complaint on file.   HPI Sylvia Nguyen is a 52 y.o. female.   Medical interpreter utilized today to facilitate visit with patient's consent.  She states she has had several days of vaginal itching and vaginal irritation.  Denies dysuria, hematuria, flank pain, pelvic or abdominal pain, fevers, chills.  Requesting STD testing.    Past Medical History:  Diagnosis Date   GERD (gastroesophageal reflux disease)    Hyperlipidemia    Hypertension    Nephrolithiasis    Uterine fibroid     Patient Active Problem List   Diagnosis Date Noted   Nephrolithiasis    Polyp of rectum    Essential hypertension 09/23/2017    Past Surgical History:  Procedure Laterality Date   COLONOSCOPY N/A 03/30/2020   Dr. Darrick Penna: Tortuous left colon, 3 mm polyp removed benign.  Internal/external hemorrhoids Next colonoscopy in 10 years   POLYPECTOMY  03/30/2020   Procedure: POLYPECTOMY;  Surgeon: West Bali, MD;  Location: [facility];  Service: Endoscopy;;  rectal    OB History     Gravida  4   Para  4   Term  4   Preterm      AB      Living  4      SAB      IAB      Ectopic      Multiple      Live Births               Home Medications    Prior to Admission medications   Medication Sig Start Date End Date Taking? Authorizing Provider  amLODipine (NORVASC) 10 MG tablet Take 1 tablet (10 mg total) by mouth daily. Tome una tableta por boca diaria 11/18/23   Jacquelin Hawking, PA-C  cetirizine (ZYRTEC ALLERGY) 10 MG tablet Take 1 tablet (10 mg total) by mouth 2 (two) times daily. Patient taking differently: Take 10 mg by mouth daily as needed for allergies. 07/23/23   Particia Nearing, PA-C  metroNIDAZOLE (FLAGYL) 500 MG tablet Take 1 tablet (500 mg total) by mouth 2 (two) times daily for 7 days. 12/05/23 12/12/23  Merrilee Jansky, MD   potassium chloride (KLOR-CON M) 10 MEQ tablet 2 po bid .  [employer] tabletas por boca dos veces diarias 11/18/23   Jacquelin Hawking, PA-C    Family History Family History  Problem Relation Age of Onset   Other Father        MVA   Colon cancer Neg Hx     Social History Social History   Tobacco Use   Smoking status: Never   Smokeless tobacco: Never  Vaping Use   Vaping status: Never Used  Substance Use Topics   Alcohol use: No   Drug use: No     Allergies   Megavite fruits & veggies [actical]   Review of Systems Review of Systems Per HPI  Physical Exam Triage Vital Signs ED Triage Vitals  Encounter Vitals Group     BP 12/02/23 1929 128/79     Systolic BP Percentile --      Diastolic BP Percentile --      Pulse Rate 12/02/23 1929 83     Resp 12/02/23 1929 20     Temp 12/02/23 1929 98.5 F (36.9 C)     Temp Source  12/02/23 1929 Oral     SpO2 12/02/23 1929 97 %     Weight --      Height --      Head Circumference --      Peak Flow --      Pain Score 12/02/23 1927 7     Pain Loc --      Pain Education --      Exclude from Growth Chart --    No data found.  Updated Vital Signs BP 128/79 (BP Location: Right Arm)   Pulse 83   Temp 98.5 F (36.9 C) (Oral)   Resp 20   SpO2 97%   Visual Acuity Right Eye Distance:   Left Eye Distance:   Bilateral Distance:    Right Eye Near:   Left Eye Near:    Bilateral Near:     Physical Exam Vitals and nursing note reviewed.  Constitutional:      Appearance: Normal appearance. She is not ill-appearing.  HENT:     Head: Atraumatic.  Eyes:     Extraocular Movements: Extraocular movements intact.     Conjunctiva/sclera: Conjunctivae normal.  Cardiovascular:     Rate and Rhythm: Normal rate and regular rhythm.     Heart sounds: Normal heart sounds.  Pulmonary:     Effort: Pulmonary effort is normal.     Breath sounds: Normal breath sounds.  Genitourinary:    Comments: GU exam deferred, self swab  performed Musculoskeletal:        General: Normal range of motion.     Cervical back: Normal range of motion and neck supple.  Skin:    General: Skin is warm and dry.  Neurological:     Mental Status: She is alert and oriented to person, place, and time.  Psychiatric:        Mood and Affect: Mood normal.        Thought Content: Thought content normal.        Judgment: Judgment normal.      UC Treatments / Results  Labs (all labs ordered are listed, but only abnormal results are displayed) Labs Reviewed  CERVICOVAGINAL ANCILLARY ONLY - Abnormal; Notable for the following components:      Result Value   Bacterial Vaginitis (gardnerella) Positive (*)    All other components within normal limits  POCT URINALYSIS DIP (MANUAL ENTRY)    EKG   Radiology No results found.  Procedures Procedures (including critical care time)  Medications Ordered in UC Medications - No data to display  Initial Impression / Assessment and Plan / UC Course  I have reviewed the triage vital signs and the nursing notes.  Pertinent labs & imaging results that were available during my care of the patient were reviewed by me and considered in my medical decision making (see chart for details).     Vaginal swab pending, declines HIV and syphilis testing today.  Discussed supportive over-the-counter medications, home care while awaiting results Final Clinical Impressions(s) / UC Diagnoses   Final diagnoses:  Vaginal itching     Discharge Instructions      Your urine testing was normal today.  We have sent out a vaginal swab for further evaluation and we will let you know if anything comes back positive and discuss treatment at that time.  Avoid any scented or irritating soaps in this area as well as feminine wipes and washes, douches.    ED Prescriptions   None    PDMP not reviewed this  encounter.   Particia Nearing, [place] 12/05/23 1756

## 2023-12-05 NOTE — Addendum Note (Signed)
Addended by: Warren Danes on: 12/05/2023 04:20 PM   Modules accepted: Orders

## 2023-12-05 NOTE — Telephone Encounter (Signed)
Per protocol, pt requires tx with metronidazole. Attempted to reach patient x1 using interpreter line. LVM.  Rx sent to pharmacy on file.

## 2023-12-05 NOTE — Telephone Encounter (Signed)
Pharmacy changed per pt request

## 2023-12-10 ENCOUNTER — Ambulatory Visit: Payer: Self-pay | Admitting: Physician Assistant

## 2023-12-10 ENCOUNTER — Encounter: Payer: Self-pay | Admitting: Physician Assistant

## 2023-12-10 VITALS — BP 125/83 | HR 68 | Temp 97.2°F | Ht 62.0 in | Wt 138.5 lb

## 2023-12-10 DIAGNOSIS — Z789 Other specified health status: Secondary | ICD-10-CM

## 2023-12-10 DIAGNOSIS — K0889 Other specified disorders of teeth and supporting structures: Secondary | ICD-10-CM

## 2023-12-10 MED ORDER — AMOXICILLIN-POT CLAVULANATE 875-125 MG PO TABS: 1.0000 | ORAL_TABLET | Freq: Two times a day (BID) | ORAL | 0 refills | Status: AC

## 2023-12-10 NOTE — Progress Notes (Signed)
   BP 125/83   Pulse 68   Temp (!) 97.2 F (36.2 C)   Ht 5\' 2"  (1.575 m)   Wt 138 lb 8 oz (62.8 kg)   SpO2 99%   BMI 25.33 kg/m    Subjective:    Patient ID: Sylvia Nguyen, female    DOB: 07-Jan-1971, 52 y.o.   MRN: [id number]  HPI: Sylvia Nguyen is a 52 y.o. female presenting on 12/10/2023 for Dental Pain (Yesterday morning she woke and her L cheek was swollen and tender to the touch.  Pt states she has a broken tooth on upper L side of mouth which pt thinks is causing the swelling. Pt states she feels as if her tooth "is being pulled." Pt has not self medicated for this and has not done anything to help relieve the pain.)   HPI   Chief Complaint  Patient presents with   Dental Pain    Yesterday morning she woke and her L cheek was swollen and tender to the touch.  Pt states she has a broken tooth on upper L side of mouth which pt thinks is causing the swelling. Pt states she feels as if her tooth "is being pulled." Pt has not self medicated for this and has not done anything to help relieve the pain.    Tooth broke several months ago.  Pain started yesterday.  Relevant past medical, surgical, family and social history reviewed and updated as indicated. Interim medical history since our last visit reviewed. Allergies and medications reviewed and updated.  Review of Systems  Per HPI unless specifically indicated above     Objective:    BP 125/83   Pulse 68   Temp (!) 97.2 F (36.2 C)   Ht 5\' 2"  (1.575 m)   Wt 138 lb 8 oz (62.8 kg)   SpO2 99%   BMI 25.33 kg/m   Wt Readings from Last 3 Encounters:  12/10/23 138 lb 8 oz (62.8 kg)  11/18/23 142 lb (64.4 kg)  08/20/23 136 lb (61.7 kg)    Physical Exam Constitutional:      General: She is not in acute distress.    Appearance: She is not toxic-appearing.  HENT:     Head: Normocephalic and atraumatic.     Mouth/Throat:     Dentition: Dental tenderness and dental caries present.     Pharynx: Oropharynx is clear.      Comments: Partial dentures on top.  Tender broken tooth left upper.  Clears secretions without difficulty/no drooling.  No trismus. Mild swelling face over left upper tooth area Pulmonary:     Effort: Pulmonary effort is normal. No respiratory distress.  Neurological:     Mental Status: She is alert and oriented to person, place, and time.  Psychiatric:        Behavior: Behavior normal.           Assessment & Plan:   Encounter Diagnoses  Name Primary?   Dentalgia Yes   Not proficient in English language       -rx augment -referral to dentist ([facility] notified)

## 2023-12-10 NOTE — Patient Instructions (Signed)
Dolor dental Dental Pain El Web designer es a menudo un signo de que sucede algo en los dientes o las encas. Tambin es algo que puede ocurrir despus de Merchandiser, retail. Si tiene Web designer, es importante que se ponga en contacto con su dentista, especialmente si no se ha determinado la causa del dolor. La intensidad del dolor dental puede variar y sus causas pueden ser [place], entre ellas, las siguientes: Caries. Las caries son causadas por bacterias que producen cidos que irritan el nervio del [place], volvindolo sensible al aire y a las temperaturas altas o bajas. Esto con el tiempo provoca molestias o dolor. Infeccin o absceso. Una vez que las bacterias llegan a la parte interna del diente (pulpa), puede producirse una infeccin bacteriana (absceso dental). El pus suele acumularse en el extremo de la raz de un diente. Lesiones. Una grieta en el diente. Retraccin de las encas que expone la raz y posiblemente los nervios de un diente. Enfermedad en las encas (periodontal). Apretar o rechinar los dientes de forma anormal. Cuidado deficiente o inadecuado en el hogar. Una razn desconocida (idioptica). El dolor puede ser leve o intenso. Puede ocurrir cuando usted: [place]. Est expuesto a temperaturas calientes o fras. Come alimentos o tomar bebidas con azcar, por ejemplo, gaseosas o caramelos. El dolor puede ser [place], o puede aparecer y desaparecer sin ninguna causa. Siga estas instrucciones en su casa: Las siguientes medidas pueden servir para Paramedic cualquier molestia que est sintiendo antes o despus de recibir Ship broker. Medicamentos Tome los medicamentos de venta libre y los recetados solamente como se lo haya indicado el dentista. Si le recetaron un antibitico, tmelo como se lo haya indicado el dentista. No deje de tomar los antibiticos aunque comience a sentirse mejor. Comida y bebida [employer] alimentos o las bebidas que le causen dolor, como los  siguientes: [place] o bebidas muy calientes o muy fros. Alimentos o bebidas dulces o con azcar. Control del dolor y la hinchazn  El hielo a veces se puede usar para reducir Chief Technology Officer y la hinchazn, especialmente si el dolor aparece despus de un tratamiento dental. Si se lo indican, aplique hielo sobre la zona dolorida de la cara. Para hacer esto: Ponga el hielo en una bolsa plstica. Coloque una toalla entre la piel y Copy. Aplique el hielo durante 20 minutos, 2 o 3 veces por da. Retire el hielo si la piel se pone de color rojo brillante. Esto es [employer]. Si no puede sentir dolor, calor o fro, tiene un mayor riesgo de que se dae la zona. Cepillarse los Barista la boca y las encas sanas, cepllese los dientes dos veces por da con una pasta dental con flor. Use una pasta dental para dientes sensibles como se lo haya indicado el dentista, especialmente si la raz est expuesta. Cepllese siempre los dientes con un cepillo de cerdas suaves. Esto ayudar a Transport planner irritacin de las encas. Instrucciones generales Use hilo dental al menos una vez al da. No se aplique calor en la parte externa de la cara. Haga grgaras con una mezcla de agua y sal 3 o 4 veces al da, o segn sea necesario. Para preparar agua con sal, disuelva totalmente de  a 1 cucharadita (de 3 a 6 g) de sal en 1 taza (237 ml) de agua tibia. Concurra a todas las visitas de seguimiento. Esto es importante. Comunquese con un dentista si: Padece algn dolor dental inexplicable. El dolor no se [employer]  medicamentos. Sus sntomas empeoran. Aparecen nuevos sntomas. Solicite ayuda de inmediato si: No puede abrir Government social research officer. Tiene problemas para respirar o tragar. Tiene fiebre. Observa que tiene [employer] cara, el cuello o la [place]. Estos sntomas pueden representar un problema grave que constituye Radio broadcast assistant. No espere a ver si los sntomas desaparecen. Solicite atencin  mdica de inmediato. Comunquese con el servicio de emergencias de su localidad (911 en los [place]). No conduzca por sus propios medios [employer]. Resumen Las causas del dolor dental pueden ser [place], entre ellas, Neomia Dear caries y Neomia Dear infeccin. El dolor puede ser leve o intenso. Tome los medicamentos de venta libre y los recetados solamente como se lo haya indicado el dentista. Controle el dolor dental a fin de Public house manager cambio. Informe a su dentista si los sntomas empeoran. Esta informacin no tiene Theme park manager el consejo del mdico. Asegrese de hacerle al mdico cualquier pregunta que tenga. Document Revised: 10/03/2020 Document Reviewed: 10/03/2020 Elsevier Patient Education  2024 [employer].

## 2023-12-11 ENCOUNTER — Telehealth: Payer: Self-pay

## 2023-12-22 ENCOUNTER — Ambulatory Visit: Payer: Self-pay | Admitting: Physician Assistant

## 2023-12-22 ENCOUNTER — Encounter: Payer: Self-pay | Admitting: Physician Assistant

## 2023-12-22 VITALS — BP 120/80 | HR 68 | Temp 97.9°F

## 2023-12-22 DIAGNOSIS — K0889 Other specified disorders of teeth and supporting structures: Secondary | ICD-10-CM

## 2023-12-22 DIAGNOSIS — Z789 Other specified health status: Secondary | ICD-10-CM

## 2023-12-22 MED ORDER — CLINDAMYCIN HCL 300 MG PO CAPS: 300.0000 mg | ORAL_CAPSULE | Freq: Four times a day (QID) | ORAL | 0 refills | Status: DC

## 2023-12-22 NOTE — Patient Instructions (Addendum)
Dentist appointment- Dr Kaleen Odea 08 January 2024 at 3:00pm  Cita con el dentista: Dr. Kaleen Odea 16 de enero de 2025 a las 15:00 horas

## 2023-12-22 NOTE — Progress Notes (Signed)
   BP 120/80   Pulse 68   Temp 97.9 F (36.6 C)   SpO2 99%    Subjective:    Patient ID: Sylvia Nguyen, female    DOB: Nov 11, 1971, 52 y.o.   MRN: [id number]  HPI: Sylvia Nguyen is a 52 y.o. female presenting on 12/22/2023 for No chief complaint on file.   HPI  She was in office 12/10/23 and got augmentin for 10 day for dental infection.   She finished these a couple days ago.   She says abx helped but it is still swelled/not gone.   She has appointment with dentist 01/08/24 at 3pm.   Relevant past medical, surgical, family and social history reviewed and updated as indicated. Interim medical history since our last visit reviewed. Allergies and medications reviewed and updated.  CURRENT MEDS: Amlodipine KCl   Review of Systems  Per HPI unless specifically indicated above     Objective:    BP 120/80   Pulse 68   Temp 97.9 F (36.6 C)   SpO2 99%   Wt Readings from Last 3 Encounters:  12/10/23 138 lb 8 oz (62.8 kg)  11/18/23 142 lb (64.4 kg)  08/20/23 136 lb (61.7 kg)    Physical Exam Constitutional:      General: She is not in acute distress.    Appearance: She is not toxic-appearing.  HENT:     Head: Normocephalic and atraumatic.     Mouth/Throat:     Mouth: Mucous membranes are moist.     Dentition: Abnormal dentition. Dental tenderness present. No dental abscesses.     Pharynx: Oropharynx is clear.     Comments: Face without swelling.  Speech clear.  No trismus Pulmonary:     Effort: Pulmonary effort is normal. No respiratory distress.  Neurological:     Mental Status: She is alert and oriented to person, place, and time.  Psychiatric:        Behavior: Behavior normal.           Assessment & Plan:   Encounter Diagnoses  Name Primary?   Dentalgia Yes   Not proficient in English language       -rx clindamycin.  Discussed with pt that she needs to see the dentist for 'cure'.   -she is to follow up in February as scheduled

## 2024-02-02 ENCOUNTER — Other Ambulatory Visit: Payer: Self-pay | Admitting: Physician Assistant

## 2024-02-02 DIAGNOSIS — E876 Hypokalemia: Secondary | ICD-10-CM

## 2024-02-02 DIAGNOSIS — I10 Essential (primary) hypertension: Secondary | ICD-10-CM

## 2024-02-17 ENCOUNTER — Ambulatory Visit: Payer: Self-pay | Admitting: Physician Assistant

## 2024-02-17 ENCOUNTER — Other Ambulatory Visit (HOSPITAL_COMMUNITY)
Admission: RE | Admit: 2024-02-17 | Discharge: 2024-02-17 | Disposition: A | Discharge status: Home / Self Care | Payer: Self-pay | Source: Ambulatory Visit | Attending: Physician Assistant | Admitting: Physician Assistant

## 2024-02-17 ENCOUNTER — Encounter: Payer: Self-pay | Admitting: Physician Assistant

## 2024-02-17 VITALS — BP 110/71 | HR 82 | Temp 98.0°F | Ht 62.0 in | Wt 144.0 lb

## 2024-02-17 DIAGNOSIS — I10 Essential (primary) hypertension: Secondary | ICD-10-CM

## 2024-02-17 DIAGNOSIS — Z789 Other specified health status: Secondary | ICD-10-CM

## 2024-02-17 DIAGNOSIS — E876 Hypokalemia: Secondary | ICD-10-CM | POA: Insufficient documentation

## 2024-02-17 DIAGNOSIS — K0889 Other specified disorders of teeth and supporting structures: Secondary | ICD-10-CM

## 2024-02-17 LAB — BASIC METABOLIC PANEL
Anion gap: 11 (ref 5–15)
BUN: 8 mg/dL (ref 6–20)
CO2: 25 mmol/L (ref 22–32)
Calcium: 9.2 mg/dL (ref 8.9–10.3)
Chloride: 102 mmol/L (ref 98–111)
Creatinine, Ser: 0.61 mg/dL (ref 0.44–1.00)
GFR, Estimated: >60 mL/min (ref >60)
Glucose, Bld: 140 mg/dL — ABNORMAL HIGH (ref 70–99)
Potassium: 3.1 mmol/L — ABNORMAL LOW (ref 3.5–5.1)
Sodium: 138 mmol/L (ref 135–145)

## 2024-02-17 NOTE — Progress Notes (Signed)
 BP 110/71   Pulse 82   Temp 98 F (36.7 C)   Ht 5\' 2"  (1.575 m)   Wt 144 lb (65.3 kg)   SpO2 99%   BMI 26.34 kg/m    Subjective:    Patient ID: Sylvia Nguyen, female    DOB: 20-Sep-1971, 53 y.o.   MRN: [id number]  HPI: Sylvia Nguyen is a 53 y.o. female presenting on 02/17/2024 for Hypertension and Dental Problem (Pt states she is still having problems with her tooth. Pt is wondering if her sugars are ok since her dental issues are taking long to heal.)   HPI  Chief Complaint  Patient presents with   Hypertension   Dental Problem    Pt states she is still having problems with her tooth. Pt is wondering if her sugars are ok since her dental issues are taking long to heal.    Pt says dentist pulled tooth but it's still infected.   She has no idea when she went to the dentist, maybe 2 weeks ago.  She says dentist gave her 2 different meds- amoxil and she doesn't remember the other one.   Pt says she drank a big glass of juice just before she had her labs drawn this morning.  She is taking her K+ 2 bid.  But She didn't take her K+ when she was taking her antibiotics for her teeth. (Because she thought it was too many pills)     Relevant past medical, surgical, family and social history reviewed and updated as indicated. Interim medical history since our last visit reviewed. Allergies and medications reviewed and updated.   Current Outpatient Medications:    amLODipine (NORVASC) 10 MG tablet, Take 1 tablet (10 mg total) by mouth daily. Tome una tableta por boca diaria, Disp: 90 tablet, Rfl: 0   cetirizine (ZYRTEC ALLERGY) 10 MG tablet, Take 1 tablet (10 mg total) by mouth 2 (two) times daily. (Patient taking differently: Take 10 mg by mouth daily as needed for allergies.), Disp: 60 tablet, Rfl: 0   potassium chloride (KLOR-CON M) 10 MEQ tablet, 2 po bid .  Tome dos tabletas por [employer] veces diarias, Disp: 360 tablet, Rfl: 0       Review of Systems  Per HPI unless  specifically indicated above     Objective:    BP 110/71   Pulse 82   Temp 98 F (36.7 C)   Ht 5\' 2"  (1.575 m)   Wt 144 lb (65.3 kg)   SpO2 99%   BMI 26.34 kg/m   Wt Readings from Last 3 Encounters:  02/17/24 144 lb (65.3 kg)  12/10/23 138 lb 8 oz (62.8 kg)  11/18/23 142 lb (64.4 kg)    Physical Exam Vitals reviewed.  Constitutional:      General: She is not in acute distress.    Appearance: She is well-developed. She is not toxic-appearing.  HENT:     Head: Normocephalic and atraumatic.     Comments: No swelling of the face    Mouth/Throat:     Dentition: No dental abscesses.     Pharynx: Oropharynx is clear. No pharyngeal swelling.      Comments: Tooth missing with gum tissue not completely healed.  No purulence or signs infection Cardiovascular:     Rate and Rhythm: Normal rate and regular rhythm.  Pulmonary:     Effort: Pulmonary effort is normal.     Breath sounds: Normal breath sounds.  Abdominal:  General: Bowel sounds are normal.     Palpations: Abdomen is soft. There is no mass.     Tenderness: There is no abdominal tenderness.  Musculoskeletal:     Cervical back: Neck supple.     Right lower leg: No edema.     Left lower leg: No edema.  Lymphadenopathy:     Cervical: No cervical adenopathy.  Skin:    General: Skin is warm and dry.  Neurological:     Mental Status: She is alert and oriented to person, place, and time.  Psychiatric:        Behavior: Behavior normal.     Results for orders placed or performed during the hospital encounter of 02/17/24  Basic metabolic panel   Collection Time: 02/17/24 10:03 AM  Result Value Ref Range   Sodium 138 135 - 145 mmol/L   Potassium 3.1 (L) 3.5 - 5.1 mmol/L   Chloride 102 98 - 111 mmol/L   CO2 25 22 - 32 mmol/L   Glucose, Bld 140 (H) 70 - 99 mg/dL   BUN 8 6 - 20 mg/dL   Creatinine, Ser 1.61 0.44 - 1.00 mg/dL   Calcium 9.2 8.9 - 09.6 mg/dL   GFR, Estimated >04 >54 mL/min   Anion gap 11 5 - 15       Assessment & Plan:    Encounter Diagnoses  Name Primary?   Primary hypertension Yes   Hypokalemia    Dentalgia    Not proficient in English language      dentalgia -She has f/u with dentist march 11  hypokalemia -Increase K+ to tid x 1 week then reck K+ labs  Htn -well controlled on amlodipine  F/u 3 months.  Will update PAP at that time

## 2024-02-17 NOTE — Patient Instructions (Addendum)
 Increase potassium to 2 capsules THREE times daily for one week. Then go to lab to get blood rechecked  Aumenta el potasio  2 capsulas TRES veces diarias por una semana. Luego ve al laboratorio para volver a Database administrator

## 2024-02-26 ENCOUNTER — Other Ambulatory Visit: Payer: Self-pay | Admitting: Physician Assistant

## 2024-02-26 MED ORDER — POTASSIUM CHLORIDE CRYS ER 10 MEQ PO TBCR: EXTENDED_RELEASE_TABLET | ORAL | 0 refills | Status: DC

## 2024-02-26 MED ORDER — AMLODIPINE BESYLATE 10 MG PO TABS: 10.0000 mg | ORAL_TABLET | Freq: Every day | ORAL | 0 refills | Status: DC

## 2024-03-11 ENCOUNTER — Other Ambulatory Visit (HOSPITAL_COMMUNITY)
Admission: RE | Admit: 2024-03-11 | Discharge: 2024-03-11 | Disposition: A | Discharge status: Home / Self Care | Payer: Self-pay | Source: Ambulatory Visit | Attending: Physician Assistant | Admitting: Physician Assistant

## 2024-03-11 DIAGNOSIS — I10 Essential (primary) hypertension: Secondary | ICD-10-CM | POA: Insufficient documentation

## 2024-03-11 DIAGNOSIS — E876 Hypokalemia: Secondary | ICD-10-CM | POA: Insufficient documentation

## 2024-03-11 LAB — POTASSIUM: Potassium: 3.5 mmol/L (ref 3.5–5.1)

## 2024-04-14 ENCOUNTER — Ambulatory Visit: Payer: Self-pay | Admitting: Physician Assistant

## 2024-04-14 ENCOUNTER — Encounter: Payer: Self-pay | Admitting: Physician Assistant

## 2024-04-14 VITALS — BP 133/89 | HR 58 | Temp 98.0°F | Ht 62.0 in | Wt 143.0 lb

## 2024-04-14 DIAGNOSIS — J019 Acute sinusitis, unspecified: Secondary | ICD-10-CM

## 2024-04-14 DIAGNOSIS — Z789 Other specified health status: Secondary | ICD-10-CM

## 2024-04-14 MED ORDER — AMOXICILLIN 500 MG PO CAPS: 500.0000 mg | ORAL_CAPSULE | Freq: Three times a day (TID) | ORAL | 0 refills | Status: AC

## 2024-04-14 NOTE — Progress Notes (Signed)
 BP 133/89   Pulse (!) 58   Temp 98 F (36.7 C)   Ht 5\' 2"  (1.575 m)   Wt 143 lb (64.9 kg)   SpO2 99%   BMI 26.16 kg/m    Subjective:    Patient ID: Sylvia Nguyen, female    DOB: [birth year], 53 y.o.   MRN: [id number]  HPI: Sylvia Nguyen is a 53 y.o. female presenting on 04/14/2024 for Eye Pain (Pulsing Pain around the L eye for about 2 weeks. Pt states she feels a spasm as if her eye is being pulled. Pt states pain then radiates towards her head.  Pt has taken IBU which eases the pain. Pt states she noticed the pain is worse when she is cooking. Pt denies injury. Pt states she has an appt with the dentist in May.  Pt would like refill on her zyrtec  which she has been out of for 2 weeks.)   HPI   Chief Complaint  Patient presents with   Eye Pain    Pulsing Pain around the L eye for about 2 weeks. Pt states she feels a spasm as if her eye is being pulled. Pt states pain then radiates towards her head.  Pt has taken IBU which eases the pain. Pt states she noticed the pain is worse when she is cooking. Pt denies injury. Pt states she has an appt with the dentist in May.  Pt would like refill on her zyrtec  which she has been out of for 2 weeks.     This has been going on for 2 weeks.  Pain radiates from tooth up through face to behind left eye and across left forehead.     Relevant past medical, surgical, family and social history reviewed and updated as indicated. Interim medical history since our last visit reviewed. Allergies and medications reviewed and updated.   Current Outpatient Medications:    amLODipine  (NORVASC ) 10 MG tablet, Take 1 tablet (10 mg total) by mouth daily. Tome una tableta por boca diaria, Disp: 90 tablet, Rfl: 0   potassium chloride  (KLOR-CON  M) 10 MEQ tablet, 2 po bid .  Tome dos tabletas por boca dos veces diarias, Disp: 360 tablet, Rfl: 0   cetirizine  (ZYRTEC  ALLERGY) 10 MG tablet, Take 1 tablet (10 mg total) by mouth 2 (two) times daily. (Patient not  taking: Reported on 04/14/2024), Disp: 60 tablet, Rfl: 0    Review of Systems  Per HPI unless specifically indicated above     Objective:    BP 133/89   Pulse (!) 58   Temp 98 F (36.7 C)   Ht 5\' 2"  (1.575 m)   Wt 143 lb (64.9 kg)   SpO2 99%   BMI 26.16 kg/m   Wt Readings from Last 3 Encounters:  04/14/24 143 lb (64.9 kg)  02/17/24 144 lb (65.3 kg)  12/10/23 138 lb 8 oz (62.8 kg)    Physical Exam Vitals reviewed.  Constitutional:      General: She is not in acute distress.    Appearance: She is well-developed. She is not toxic-appearing.  HENT:     Head: Normocephalic and atraumatic.     Right Ear: Hearing, tympanic membrane, ear canal and external ear normal.     Left Ear: Hearing, tympanic membrane, ear canal and external ear normal.     Nose: Congestion and rhinorrhea present.     Mouth/Throat:     Mouth: Mucous membranes are moist.     Dentition: Abnormal  dentition. No dental abscesses.     Pharynx: Uvula midline. Oropharyngeal exudate present.  Cardiovascular:     Rate and Rhythm: Normal rate and regular rhythm.  Pulmonary:     Effort: Pulmonary effort is normal.     Breath sounds: Normal breath sounds. No wheezing.  Musculoskeletal:     Cervical back: Neck supple.  Lymphadenopathy:     Cervical: No cervical adenopathy.  Skin:    General: Skin is warm and dry.  Neurological:     Mental Status: She is alert and oriented to person, place, and time.  Psychiatric:        Behavior: Behavior normal.           Assessment & Plan:    Encounter Diagnoses  Name Primary?   Acute sinusitis, recurrence not specified, unspecified location Yes   Not proficient in English language      Rx amoxil  To dentist in may as scheduled Discussed zyrtec  is OTC Follow up as scheduled

## 2024-05-03 ENCOUNTER — Other Ambulatory Visit: Payer: Self-pay | Admitting: Physician Assistant

## 2024-05-03 DIAGNOSIS — I10 Essential (primary) hypertension: Secondary | ICD-10-CM

## 2024-05-03 DIAGNOSIS — E876 Hypokalemia: Secondary | ICD-10-CM

## 2024-05-18 ENCOUNTER — Ambulatory Visit: Payer: Self-pay | Admitting: Physician Assistant

## 2024-05-19 ENCOUNTER — Encounter: Payer: Self-pay | Admitting: Physician Assistant

## 2024-05-19 ENCOUNTER — Other Ambulatory Visit (HOSPITAL_COMMUNITY)
Admission: RE | Admit: 2024-05-19 | Discharge: 2024-05-19 | Disposition: A | Discharge status: Home / Self Care | Payer: Self-pay | Source: Ambulatory Visit | Attending: Physician Assistant | Admitting: Physician Assistant

## 2024-05-19 ENCOUNTER — Ambulatory Visit: Payer: Self-pay | Admitting: Physician Assistant

## 2024-05-19 VITALS — BP 120/78 | HR 59 | Temp 98.4°F | Ht 62.0 in | Wt 145.0 lb

## 2024-05-19 DIAGNOSIS — I10 Essential (primary) hypertension: Secondary | ICD-10-CM | POA: Insufficient documentation

## 2024-05-19 DIAGNOSIS — R9431 Abnormal electrocardiogram [ECG] [EKG]: Secondary | ICD-10-CM

## 2024-05-19 DIAGNOSIS — E876 Hypokalemia: Secondary | ICD-10-CM | POA: Insufficient documentation

## 2024-05-19 DIAGNOSIS — R09A2 Foreign body sensation, throat: Secondary | ICD-10-CM

## 2024-05-19 DIAGNOSIS — Z789 Other specified health status: Secondary | ICD-10-CM

## 2024-05-19 DIAGNOSIS — R079 Chest pain, unspecified: Secondary | ICD-10-CM

## 2024-05-19 LAB — BASIC METABOLIC PANEL WITH GFR
Anion gap: 10 (ref 5–15)
BUN: 11 mg/dL (ref 6–20)
CO2: 25 mmol/L (ref 22–32)
Calcium: 8.9 mg/dL (ref 8.9–10.3)
Chloride: 102 mmol/L (ref 98–111)
Creatinine, Ser: 0.6 mg/dL (ref 0.44–1.00)
GFR, Estimated: >60 mL/min (ref >60)
Glucose, Bld: 149 mg/dL — ABNORMAL HIGH (ref 70–99)
Potassium: 3.1 mmol/L — ABNORMAL LOW (ref 3.5–5.1)
Sodium: 137 mmol/L (ref 135–145)

## 2024-05-19 MED ORDER — PANTOPRAZOLE SODIUM 40 MG PO TBEC: 40.0000 mg | DELAYED_RELEASE_TABLET | Freq: Two times a day (BID) | ORAL | 3 refills | Status: DC

## 2024-05-19 MED ORDER — NITROGLYCERIN 0.4 MG SL SUBL: 0.4000 mg | SUBLINGUAL_TABLET | SUBLINGUAL | 3 refills | Status: AC | PRN

## 2024-05-19 NOTE — Patient Instructions (Signed)
 Nitroglycerin Sublingual Tablets Qu es este medicamento? La NITROGLICERINA previene y trata Chief Technology Officer en el pecho (angina de pecho). Acta relajando los vasos sanguneos y as se reduce el trabajo que debe realizar el corazn. Pertenece a un grupo de medicamentos llamados nitratos. Este medicamento puede ser utilizado para otros usos; si tiene alguna pregunta consulte con su proveedor de atencin mdica o con su farmacutico. MARCAS COMUNES: Nitroquick, Nitrostat, Nitrotab Qu le debo informar a mi profesional de la salud antes de tomar este medicamento? Necesitan saber si usted presenta alguno de los siguientes problemas o situaciones: Anemia Lesin de la cabeza, accidente cerebrovascular reciente o hemorragia cerebral Enfermedad heptica [place] cardiaco previo Una reaccin alrgica o inusual a la nitroglicerina, a otros medicamentos, alimentos, colorantes o conservantes Si est embarazada o buscando quedar embarazada Si est amamantando a un beb Cmo debo utilizar este medicamento? Tome este medicamento por va oral segn sea necesario. selo ante el primer signo de un ataque de angina de pecho (dolor u opresin en el pecho). Tambin puede usar este medicamento 5 a 10 minutos antes de una situacin que probablemente produzca dolor en el pecho. Siga las instrucciones exactamente como indica la etiqueta del [place]. Colquese una tableta debajo de la lengua y deje que se disuelva. No la trague entera. Remplace la dosis si la traga accidentalmente. Ser de ayuda que su boca no est seca. La saliva alrededor de la tableta ayudar a que se disuelva ms rpido. No coma ni beba, fume ni mastique tabaco mientras se disuelve la tableta. Sintese mientras usa  este medicamento. Con un ataque de angina de pecho, debera sentirse mejor en un plazo de 5 minutos despus de la primera dosis. Puede tomar una dosis cada 5 minutos hasta un total de 3 dosis. Si no se siente mejor o se siente peor despus de 1  dosis, llame de inmediato al 9-1-1. No tome ms de 3 dosis en un periodo de 15 minutos. Es posible que su proveedor de atencin Careers adviser d otras instrucciones. Si as sucede, siga esas instrucciones. No use su medicamento con una frecuencia mayor a la indicada. Hable con su proveedor de atencin mdica sobre el uso de este medicamento en nios. Puede requerir atencin especial. Sobredosis: Pngase en contacto inmediatamente con un centro toxicolgico o una sala de urgencia si usted cree que haya tomado demasiado medicamento.<br>ATENCIN: [employer] es solo para usted. No comparta este medicamento con nadie. Qu sucede si me olvido de una dosis? No se aplica en este caso. Este medicamento solo se usa  cuando se necesita. Qu puede interactuar con este medicamento? No use este medicamento con ninguno de los siguientes productos: Ciertos medicamentos para migraas, tales como ergotamina y dihidroergotamina Medicamentos utilizados para tratar la disfuncin erctil, tales como sildenafilo, tadalafilo y vardenafilo Riociguat Este medicamento tambin podra interactuar con los siguientes productos: Alteplasa Aspirina Heparina Medicamentos para la presin arterial Medicamentos para la depresin Otros medicamentos utilizados para tratar la angina de Hotel manager Fenotiazinas, tales como Barista, mesoridazina, proclorperazina, tioridazina Puede ser que esta lista no menciona todas las posibles interacciones. Informe a su profesional de [employer] de [employer] productos a base de hierbas, medicamentos de [place] o suplementos nutritivos que est tomando. Si usted fuma, consume bebidas alcohlicas o si utiliza drogas ilegales, indqueselo tambin a su profesional de [employer]. Algunas sustancias pueden interactuar con su medicamento. A qu debo estar atento al usar [employer]? Hable con su equipo de atencin si siente que su medicamento ya no funciona. Lleve este  medicamento con usted en  todo momento. Sintese o acustese cuando use el medicamento para evitar caerse si se marea o se desmaya despus de usarlo. Intente mantener la calma. Esto lo ayudar a sentirse mejor ms rpido. Si se marea, respire hondo varias veces y recustese con los pies elevados o inclnese hacia delante con la cabeza apoyada entre las rodillas. Este medicamento podra afectar su coordinacin, tiempo de reaccin o juicio. No conduzca ni opere maquinaria pesada hasta que sepa cmo le afecta este medicamento. Pngase de pie o levntese lentamente para reducir el riesgo de mareos o [place]. Beber alcohol con [employer] puede aumentar el riesgo de estos efectos secundarios. No se trate usted mismo si tiene tos, resfriado o dolor mientras est usando este medicamento sin consultar con su equipo de atencin. Algunos ingredientes pueden aumentar su presin arterial. Qu efectos secundarios puedo tener al [employer] este medicamento? Efectos secundarios que debe informar a su mdico o a Producer, television/film/video de la salud tan pronto como sea posible: Therapist, art (erupcin cutnea, comezn/picazn o urticaria; hinchazn de la cara, los labios o Scientist, product/process development) presin arterial baja (mareos; sensacin de desmayo o aturdimiento; cadas; debilidad o cansancio inusuales) recuentos bajos de glbulos rojos (dificultad para respirar; sensacin de desmayo; aturdimiento; cadas; debilidad o cansancio inusuales) Efectos secundarios que generalmente no requieren atencin mdica (infrmelos a su mdico o a su profesional de la salud si persisten o si son molestos): enrojecimiento facial dolor de cabeza nuseas, vmito Puede ser que [employer] no menciona todos los posibles efectos secundarios. Comunquese a su mdico por asesoramiento mdico [employer]. Usted puede informar los efectos secundarios a la FDA por telfono al 1-800-FDA-1088. Dnde debo guardar mi medicina? Mantenga fuera del alcance de los  nios. Guarde a temperatura ambiente, entre 20 y 25 grados Celsius (68 y 61 grados Fahrenheit). Guarde en el recipiente original. Proteja de la luz y la humedad. Mantenga bien cerrado. Deseche todo el medicamento que no haya utilizado despus de la fecha de vencimiento. ATENCIN: Este folleto es un resumen. Puede ser que no cubra toda la posible informacin. Si usted tiene preguntas acerca de esta medicina, consulte con su mdico, su farmacutico o su profesional de Radiographer, therapeutic.  2024 Elsevier/Gold Standard (2023-05-05 00:00:00) -------------------------------------------------------------------------------------------------------------  ERGE en adultos: cambios en la dieta GERD in Adults: Diet Changes Cuando una persona tiene enfermedad de reflujo gastroesofgico (ERGE), es posible que deba hacer cambios en su dieta. Elegir los alimentos adecuados puede ayudar a [employer]. Considere recurrir a un experto en alimentacin saludable llamado nutricionista. Este puede ayudarlo a hacer elecciones alimentarias saludables. Consejos para seguir Surveyor, minerals Al leer las etiquetas de los alimentos Elija alimentos que tengan bajo contenido de grasas saturadas. Los alimentos que pueden ayudar con los sntomas incluyen los siguientes: Alimentos con menos del 5 % de los valores diarios (VD) de grasa. Alimentos con 0 gramos de grasas trans. Al cocinar Cocine los alimentos de formas que no requieran [place]. Estas formas incluyen las siguientes: Hornear. Cocer al vapor. Grillar. Hervir. Para agregar sabor, trate de consumir hierbas con bajo contenido de picante y [place]. Evite frer los alimentos. Planificacin de las comidas  Haga comidas pequeas durante Glass blower/designer de hacer 3 comidas abundantes. Coma lentamente y en un lugar donde se sienta relajado. Si se lo indic el mdico, evite: Consumir alimentos que le ocasionen sntomas. Lleve un registro de los alimentos para identificar aquellos  que le causen sntomas. Alcohol. Beber mucha cantidad de lquido  con las comidas. Instrucciones generales Durante 2 a 3 horas despus de comer, evite: Agacharse. Realizar actividad fsica. Acostarse. Masticar chicle sin azcar despus de las comidas. Qu alimentos debo comer? Siga una dieta saludable. Trate de incluir: Alimentos con gran cantidad de [place]. Esto incluye lo siguiente: [place] y verduras. Cereales integrales y legumbres. Productos lcteos con bajo contenido de grasa. Carne magra, pescado y aves. Claras de huevo. Los alimentos que causan sntomas en otra persona pueden no causarle sntomas a usted. Trabaje con el mdico para hallar alimentos que sean seguros para usted. Es posible que los productos que se enumeran ms arriba no sean todos los alimentos y las bebidas que puede consumir. Consulte a su nutricionista para obtener ms informacin. Es posible que los productos detallados arriba no constituyan una lista completa de los alimentos y las bebidas que puede tomar. Consulte a un nutricionista para obtener ms informacin. Qu alimentos debo evitar? Limitar algunos de estos alimentos puede ayudar a [employer]. Cada persona es diferente. Hable con un nutricionista o con su mdico para que lo ayude a Photographer. Algunos de los [employer] evitar pueden incluir: Frutas Frutas que sean muy cidas. Estas pueden ser las frutas ctricas como la naranja, el pomelo, la pia y el limn. Verduras Verduras fritas, como las papas fritas. Verduras, salsas o aderezos elaborados con grasa agregada y verduras cidas. Estos pueden [employer] y los productos con tomate, el aj, la [place], el ajo y el rbano picante. Cereales Past[place] o panes sin levadura con grasa agregada. Carnes y [address] protenas [address] de alto contenido graso como carne grasa de vaca o cerdo, salchichas, costillas, jamn, salchicha, salame y tocino. Carnes o  protenas fritas, como el pescado frito y el pollo frito. Yemas de huevo. Grasas y [employer]. Margarina. Lardo. [place]equilla clarificada. Bebidas Caf y Lorena Rolling bebidas con cafena. Bebidas gaseosas y [address], como los refrescos y las bebidas energizantes. Jugo de fruta hecho con frutas cidas, como naranja o pomelo. Jugo de tomate. Dulces y postres Chocolate y cacao. Rosquillas. Alios y condimentos Menta, como la menta piperita y la hierbabuena. Condimentos, hierbas o aderezos que ocasionen sntomas. Estos pueden incluir el curry, la salsa picante o los aderezos para ensalada a base de vinagre. Es posible que los productos que se enumeran ms arriba no sean todos los alimentos y las bebidas que Personnel officer. Consulte a su nutricionista para obtener ms informacin. Preguntas para hacerle al [employer] Los cambios en la dieta y en la vida cotidiana a menudo son los primeros pasos que se toman para Company secretary los sntomas de [place]. Si estos cambios no dan resultados, consulte al mdico si debe tomar [employer]. Dnde obtener ms informacin International Foundation for Gastrointestinal Disorders (Fundacin Internacional para los Trastornos Gastrointestinales): aboutgerd.org Esta informacin no tiene Theme park manager el consejo del mdico. Asegrese de hacerle al mdico cualquier pregunta que tenga. Document Revised: 06/23/2023 Document Reviewed: 06/23/2023 Elsevier Patient Education  2024 [employer].

## 2024-05-19 NOTE — Progress Notes (Signed)
 BP 120/78   Pulse (!) 59   Temp 98.4 F (36.9 C)   Ht 5\' 2"  (1.575 m)   Wt 145 lb (65.8 kg)   LMP 05/19/2022 (Approximate)   SpO2 99%   BMI 26.52 kg/m    Subjective:    Patient ID: Sylvia Nguyen, female    DOB: Jan 22, 1971, 53 y.o.   MRN: [id number]  HPI: Sylvia Nguyen is a 53 y.o. female presenting on 05/19/2024 for Gynecologic Exam and Hypertension   HPI   Chief Complaint  Patient presents with   Gynecologic Exam   Hypertension    Pt is 52yoF with htn and hypokalemia.  She is in today for routine follow up.  She says she has had Hurting chest pain x 1 week.  A hurts a little bit and then goes away and then returns.    No known cause- sometimes at rest and sometimes with exertion.  She says it sometimes feels like there is something stuck in her throat.   No sob or emesis   no idiaphoresis. .  Sometimes nausea.    She Had normal echo and ETT in 2018    Relevant past medical, surgical, family and social history reviewed and updated as indicated. Interim medical history since our last visit reviewed. Allergies and medications reviewed and updated.   Current Outpatient Medications:    amLODipine  (NORVASC ) 10 MG tablet, Take 1 tablet (10 mg total) by mouth daily. Tome una tableta por boca diaria, Disp: 90 tablet, Rfl: 0   potassium chloride  (KLOR-CON  M) 10 MEQ tablet, 2 po bid .  Tome dos tabletas por boca dos veces diarias, Disp: 360 tablet, Rfl: 0   cetirizine  (ZYRTEC  ALLERGY) 10 MG tablet, Take 1 tablet (10 mg total) by mouth 2 (two) times daily. (Patient not taking: Reported on 05/19/2024), Disp: 60 tablet, Rfl: 0    Review of Systems  Per HPI unless specifically indicated above     Objective:     BP 120/78   Pulse (!) 59   Temp 98.4 F (36.9 C)   Ht 5\' 2"  (1.575 m)   Wt 145 lb (65.8 kg)   LMP 05/19/2022 (Approximate)   SpO2 99%   BMI 26.52 kg/m   Wt Readings from Last 3 Encounters:  05/19/24 145 lb (65.8 kg)  04/14/24 143 lb (64.9 kg)  02/17/24  144 lb (65.3 kg)    Physical Exam Vitals reviewed.  Constitutional:      General: She is not in acute distress.    Appearance: She is well-developed. She is not toxic-appearing.  HENT:     Head: Normocephalic and atraumatic.  Cardiovascular:     Rate and Rhythm: Normal rate and regular rhythm.  Pulmonary:     Effort: Pulmonary effort is normal.     Breath sounds: Normal breath sounds.  Abdominal:     General: Bowel sounds are normal.     Palpations: Abdomen is soft. There is no hepatomegaly, splenomegaly, mass or pulsatile mass.     Tenderness: There is no abdominal tenderness. There is no guarding or rebound.  Musculoskeletal:     Cervical back: Neck supple.     Right lower leg: No edema.     Left lower leg: No edema.  Lymphadenopathy:     Cervical: No cervical adenopathy.  Skin:    General: Skin is warm and dry.  Neurological:     Mental Status: She is alert and oriented to person, place, and time.  Psychiatric:  Behavior: Behavior normal.          EKG SB at 57bpm.  Inf and sept t wave changes new comp with EKG from 07/25/2022    Assessment & Plan:    Encounter Diagnoses  Name Primary?   Chest pain, unspecified type Yes   Primary hypertension    Globus sensation    Abnormal EKG    Not proficient in English language      -Rx protonix  bid and ntg -she was educated on how to use the ntg -Refer to cardiolgoy -pt to RTO for PAP -she is counseled to avoid spicy food and given reading information on gerd.  This is more likely etiology particularly in light of the large K+ pills she takes  -she is to contact office for worsening or new symptoms.  She was told to go to ER if cp persists after 3 ntg

## 2024-05-20 ENCOUNTER — Other Ambulatory Visit: Payer: Self-pay | Admitting: Physician Assistant

## 2024-05-20 ENCOUNTER — Ambulatory Visit: Payer: Self-pay | Admitting: Physician Assistant

## 2024-05-20 DIAGNOSIS — E876 Hypokalemia: Secondary | ICD-10-CM

## 2024-06-09 ENCOUNTER — Telehealth: Payer: Self-pay | Admitting: Student

## 2024-06-09 ENCOUNTER — Other Ambulatory Visit (HOSPITAL_COMMUNITY)
Admission: RE | Admit: 2024-06-09 | Discharge: 2024-06-09 | Disposition: A | Discharge status: Home / Self Care | Payer: Self-pay | Source: Ambulatory Visit | Attending: Physician Assistant | Admitting: Physician Assistant

## 2024-06-09 DIAGNOSIS — E876 Hypokalemia: Secondary | ICD-10-CM | POA: Insufficient documentation

## 2024-06-09 LAB — POTASSIUM: Potassium: 4.3 mmol/L (ref 3.5–5.1)

## 2024-06-09 NOTE — Telephone Encounter (Signed)
 LPN called pt to remind her to get her labs drawn to recheck her K+. Pt agrees and states she will go today.

## 2024-06-10 ENCOUNTER — Ambulatory Visit: Payer: Self-pay | Admitting: Physician Assistant

## 2024-06-10 ENCOUNTER — Encounter: Payer: Self-pay | Admitting: Physician Assistant

## 2024-06-10 VITALS — BP 138/77 | HR 79 | Temp 98.3°F | Ht 62.0 in | Wt 147.0 lb

## 2024-06-10 DIAGNOSIS — Z789 Other specified health status: Secondary | ICD-10-CM

## 2024-06-10 DIAGNOSIS — E876 Hypokalemia: Secondary | ICD-10-CM

## 2024-06-10 DIAGNOSIS — J019 Acute sinusitis, unspecified: Secondary | ICD-10-CM

## 2024-06-10 MED ORDER — AMOXICILLIN-POT CLAVULANATE 875-125 MG PO TABS: 1.0000 | ORAL_TABLET | Freq: Two times a day (BID) | ORAL | 0 refills | Status: AC

## 2024-06-10 NOTE — Progress Notes (Signed)
 BP 138/77   Pulse 79   Temp 98.3 F (36.8 C)   Ht 5' 2 (1.575 m)   Wt 147 lb (66.7 kg)   LMP 05/19/2022 (Approximate)   SpO2 98%   BMI 26.89 kg/m    Subjective:    Patient ID: Sylvia Nguyen, female    DOB: October 25, 1971, 53 y.o.   MRN: [id number]  HPI: Sylvia Nguyen is a 53 y.o. female presenting on 06/10/2024 for Facial Swelling (Pt c/o of L ear pain, L eye pain, L cheek pain. When pain is too bad the pain is all over the L side of her head. Pt had a root canal on 04-22-24.)   HPI   Chief Complaint  Patient presents with   Facial Swelling    Pt c/o of L ear pain, L eye pain, L cheek pain. When pain is too bad the pain is all over the L side of her head. Pt had a root canal on 04-22-24.    Pt says her Face started swelling. - it swells when she cooks or drink hot drinks or talks loud For one week.  She then says that two weeks ago she felt great. She then says she has been having this swelling and pain for 3 months since she had her last tooth taken out.  Pt is a difficult historian.      Relevant past medical, surgical, family and social history reviewed and updated as indicated. Interim medical history since our last visit reviewed. Allergies and medications reviewed and updated.    Current Outpatient Medications:    amLODipine  (NORVASC ) 10 MG tablet, Take 1 tablet (10 mg total) by mouth daily. Tome una tableta por boca diaria, Disp: 90 tablet, Rfl: 0   cetirizine  (ZYRTEC  ALLERGY) 10 MG tablet, Take 1 tablet (10 mg total) by mouth 2 (two) times daily. (Patient taking differently: Take 10 mg by mouth daily as needed for allergies.), Disp: 60 tablet, Rfl: 0   nitroGLYCERIN  (NITROSTAT ) 0.4 MG SL tablet, Place 1 tablet (0.4 mg total) under the tongue every 5 (five) minutes as needed for chest pain., Disp: 25 tablet, Rfl: 3   pantoprazole  (PROTONIX ) 40 MG tablet, Take 1 tablet (40 mg total) by mouth 2 (two) times daily before a meal. Tome una tableta por boca dos veces diarias  con comida, Disp: 60 tablet, Rfl: 3   potassium chloride  (KLOR-CON  M) 10 MEQ tablet, 2 po bid .  Tome dos tabletas por [employer] veces diarias, Disp: 360 tablet, Rfl: 0    Review of Systems  Per HPI unless specifically indicated above     Objective:    BP 138/77   Pulse 79   Temp 98.3 F (36.8 C)   Ht 5' 2 (1.575 m)   Wt 147 lb (66.7 kg)   LMP 05/19/2022 (Approximate)   SpO2 98%   BMI 26.89 kg/m   Wt Readings from Last 3 Encounters:  06/10/24 147 lb (66.7 kg)  05/19/24 145 lb (65.8 kg)  04/14/24 143 lb (64.9 kg)    Physical Exam Vitals reviewed.  Constitutional:      General: She is not in acute distress.    Appearance: She is well-developed. She is not ill-appearing or toxic-appearing.  HENT:     Head: Normocephalic and atraumatic.     Jaw: No trismus, tenderness, swelling or pain on movement.     Salivary Glands: Right salivary gland is not tender. Left salivary gland is not tender.  Comments: No swelling of the face appreciated    Right Ear: Tympanic membrane, ear canal and external ear normal.     Left Ear: Tympanic membrane, ear canal and external ear normal.     Nose:     Right Sinus: No maxillary sinus tenderness or frontal sinus tenderness.     Left Sinus: No maxillary sinus tenderness or frontal sinus tenderness.     Mouth/Throat:     Mouth: Mucous membranes are moist.     Dentition: No dental tenderness or dental abscesses.     Pharynx: Oropharynx is clear.     Comments: Pt was wearing a partial on the top which was removed for exam.  Several teeth missing.    Cardiovascular:     Rate and Rhythm: Normal rate and regular rhythm.  Pulmonary:     Effort: Pulmonary effort is normal.     Breath sounds: Normal breath sounds.  Abdominal:     General: Bowel sounds are normal.   Musculoskeletal:     Cervical back: Neck supple.  Lymphadenopathy:     Cervical: No cervical adenopathy.   Skin:    General: Skin is warm and dry.   Neurological:      Mental Status: She is alert and oriented to person, place, and time.   Psychiatric:        Behavior: Behavior normal.     Results for orders placed or performed during the hospital encounter of 06/09/24  Potassium   Collection Time: 06/09/24 12:57 PM  Result Value Ref Range   Potassium 4.3 3.5 - 5.1 mmol/L      Assessment & Plan:   Encounter Diagnoses  Name Primary?   Acute sinusitis, recurrence not specified, unspecified location Yes   Hypokalemia    Not proficient in English language      -reviewed K+ labs with pt -rx augmentin  for 10 days.   -pt has appt July 1.  She is to contact office sooner prn changes or worsening -pt is reminded that she needs to update her enrollment with [facility] which expired 05/25/2024

## 2024-06-22 ENCOUNTER — Ambulatory Visit: Payer: Self-pay | Admitting: Physician Assistant

## 2024-07-27 ENCOUNTER — Encounter: Payer: Self-pay | Admitting: Physician Assistant

## 2024-07-27 ENCOUNTER — Ambulatory Visit: Payer: Self-pay | Admitting: Physician Assistant

## 2024-07-27 ENCOUNTER — Other Ambulatory Visit (HOSPITAL_COMMUNITY)
Admission: RE | Admit: 2024-07-27 | Discharge: 2024-07-27 | Disposition: A | Discharge status: Home / Self Care | Payer: Self-pay | Source: Ambulatory Visit | Attending: Physician Assistant | Admitting: Physician Assistant

## 2024-07-27 VITALS — BP 118/78 | HR 59 | Temp 98.2°F | Ht 62.0 in | Wt 148.8 lb

## 2024-07-27 DIAGNOSIS — Z124 Encounter for screening for malignant neoplasm of cervix: Secondary | ICD-10-CM | POA: Insufficient documentation

## 2024-07-27 DIAGNOSIS — Z789 Other specified health status: Secondary | ICD-10-CM

## 2024-07-27 DIAGNOSIS — N644 Mastodynia: Secondary | ICD-10-CM

## 2024-07-27 DIAGNOSIS — Z1239 Encounter for other screening for malignant neoplasm of breast: Secondary | ICD-10-CM

## 2024-07-27 NOTE — Progress Notes (Signed)
 BP 118/78   Pulse (!) 59   Temp 98.2 F (36.8 C)   Ht 5' 2 (1.575 m)   Wt 148 lb 12 oz (67.5 kg)   LMP 07/27/2022 (Approximate)   SpO2 99%   BMI 27.21 kg/m    Subjective:    Patient ID: Sylvia Nguyen Chard, female    DOB: 04-Oct-1971, 53 y.o.   MRN: [id number]  HPI: JIALI LINNEY is a 53 y.o. female presenting on 07/27/2024 for Gynecologic Exam   HPI   Chief Complaint  Patient presents with   Gynecologic Exam     She didn't get the pantoprazole  refilled after she ran out.  She says she feels a bit better.  Pt says her right breast started hurting five days ago.  No bleeding or discharge, just pain.   LMP about two years ago.    Relevant past medical, surgical, family and social history reviewed and updated as indicated. Interim medical history since our last visit reviewed. Allergies and medications reviewed and updated.   Current Outpatient Medications:    amLODipine  (NORVASC ) 10 MG tablet, Take 1 tablet (10 mg total) by mouth daily. Tome una tableta por boca diaria, Disp: 90 tablet, Rfl: 0   cetirizine  (ZYRTEC  ALLERGY) 10 MG tablet, Take 1 tablet (10 mg total) by mouth 2 (two) times daily., Disp: 60 tablet, Rfl: 0   nitroGLYCERIN  (NITROSTAT ) 0.4 MG SL tablet, Place 1 tablet (0.4 mg total) under the tongue every 5 (five) minutes as needed for chest pain., Disp: 25 tablet, Rfl: 3   potassium chloride  (KLOR-CON  M) 10 MEQ tablet, 2 po bid .  Tome dos tabletas por boca dos veces diarias, Disp: 360 tablet, Rfl: 0   pantoprazole  (PROTONIX ) 40 MG tablet, Take 1 tablet (40 mg total) by mouth 2 (two) times daily before a meal. Tome una tableta por boca dos veces diarias con comida (Patient not taking: Reported on 07/27/2024), Disp: 60 tablet, Rfl: 3    Review of Systems  Per HPI unless specifically indicated above     Objective:    BP 118/78   Pulse (!) 59   Temp 98.2 F (36.8 C)   Ht 5' 2 (1.575 m)   Wt 148 lb 12 oz (67.5 kg)   LMP 07/27/2022 (Approximate)   SpO2  99%   BMI 27.21 kg/m   Wt Readings from Last 3 Encounters:  07/27/24 148 lb 12 oz (67.5 kg)  06/10/24 147 lb (66.7 kg)  05/19/24 145 lb (65.8 kg)    Physical Exam Vitals and nursing note reviewed. Exam conducted with a chaperone present.  Constitutional:      General: She is not in acute distress.    Appearance: She is well-developed. She is not toxic-appearing.  Pulmonary:     Effort: Pulmonary effort is normal.  Chest:  Breasts:    Right: Normal.     Left: Normal.  Abdominal:     Palpations: Abdomen is soft. There is no mass.     Tenderness: There is no abdominal tenderness. There is no guarding or rebound.  Genitourinary:    Labia:        Right: No rash, tenderness or lesion.        Left: No rash, tenderness or lesion.      Vagina: Normal.     Cervix: Normal. No cervical motion tenderness, discharge or friability.     Uterus: Normal.      Adnexa:        Right: No  mass, tenderness or fullness.         Left: No mass, tenderness or fullness.       Comments: (Nurse Berenice assisted) Musculoskeletal:     Right lower leg: No edema.     Left lower leg: No edema.  Skin:    General: Skin is warm and dry.     Findings: No lesion.  Neurological:     Mental Status: She is alert and oriented to person, place, and time.  Psychiatric:        Behavior: Behavior normal.           Assessment & Plan:    Encounter Diagnoses  Name Primary?   Routine cervical smear Yes   Breast pain, right    Encounter for screening for malignant neoplasm of breast, unspecified screening modality    Not proficient in [place] language      -refer for mammogram -pt reminded about appointment with cardiologist later this month -pt encouraged to resume pantoprazole  -pt to follow up two months.  She is to contact office sooner prn

## 2024-07-28 ENCOUNTER — Telehealth: Payer: Self-pay

## 2024-07-28 NOTE — Telephone Encounter (Signed)
 Telephoned patient at mobile number using interpreter#[id number]. Left a voice message with [facility] contact scheduling information.

## 2024-07-29 ENCOUNTER — Other Ambulatory Visit (HOSPITAL_COMMUNITY): Payer: Self-pay | Admitting: Obstetrics and Gynecology

## 2024-07-29 DIAGNOSIS — N6310 Unspecified lump in the right breast, unspecified quadrant: Secondary | ICD-10-CM

## 2024-07-30 ENCOUNTER — Other Ambulatory Visit (HOSPITAL_COMMUNITY): Payer: Self-pay | Admitting: Obstetrics and Gynecology

## 2024-07-30 DIAGNOSIS — N644 Mastodynia: Secondary | ICD-10-CM

## 2024-08-03 LAB — CYTOLOGY - PAP
Comment: NEGATIVE
Diagnosis: NEGATIVE
High risk HPV: NEGATIVE

## 2024-08-04 ENCOUNTER — Ambulatory Visit: Payer: Self-pay | Attending: Internal Medicine | Admitting: Internal Medicine

## 2024-08-04 ENCOUNTER — Encounter: Payer: Self-pay | Admitting: Internal Medicine

## 2024-08-04 VITALS — BP 126/80 | HR 62 | Ht 62.0 in | Wt 149.8 lb

## 2024-08-04 DIAGNOSIS — R079 Chest pain, unspecified: Secondary | ICD-10-CM | POA: Insufficient documentation

## 2024-08-04 NOTE — Patient Instructions (Addendum)
 Medication Instructions:  Your physician recommends that you continue on your current medications as directed. Please refer to the Current Medication list given to you today.   Labwork: None  Testing/Procedures: Your physician has requested that you have en exercise stress myoview. For further information please visit [email or web address] Please follow instruction sheet, as given.   Follow-Up: Your physician recommends that you schedule a follow-up appointment in: Pending Results  Any Other Special Instructions Will Be Listed Below (If Applicable). Thank you for choosing [facility]!     If you need a refill on your cardiac medications before your next appointment, please call your pharmacy.

## 2024-08-04 NOTE — Progress Notes (Signed)
 Cardiology Office Note  Date: 08/04/2024   ID: Sylvia, Nguyen 04-08-1971, MRN [id number]  PCP:  Comer Kirsch, PA-C  Cardiologist:  Diannah SHAUNNA Maywood, MD Electrophysiologist:  None   History of Present Illness: Sylvia Nguyen is a 53 y.o. female  Referred to cardiology clinic for evaluation of chest pain.  Patient has ongoing chest pain for the last 1 month.  Occurs at rest and may be stress.  No chest pain with exertion.  Does not recall how long it last in the frequency.  She usually messages when she gets chest pain and it resolves continuously.  She performs her household chores with no issues.  No other symptoms of DOE, dizziness, lightheadedness, syncope, palpitations or leg swelling.  Blood pressure controlled today in the clinic.  However when she underwent ETT in 2018, she had hypertensive blood pressure response.  Does not check blood pressures at home.  Past Medical History:  Diagnosis Date   GERD (gastroesophageal reflux disease)    Hyperlipidemia    Hypertension    Nephrolithiasis    Uterine fibroid     Past Surgical History:  Procedure Laterality Date   COLONOSCOPY N/A 03/30/2020   Dr. Harvey: Tortuous left colon, 3 mm polyp removed benign.  Internal/external hemorrhoids Next colonoscopy in 10 years   POLYPECTOMY  03/30/2020   Procedure: POLYPECTOMY;  Surgeon: Harvey Margo CROME, MD;  Location: [facility];  Service: Endoscopy;;  rectal    Current Outpatient Medications  Medication Sig Dispense Refill   amLODipine  (NORVASC ) 10 MG tablet Take 1 tablet (10 mg total) by mouth daily. Tome una tableta por boca diaria 90 tablet 0   nitroGLYCERIN  (NITROSTAT ) 0.4 MG SL tablet Place 1 tablet (0.4 mg total) under the tongue every 5 (five) minutes as needed for chest pain. 25 tablet 3   potassium chloride  (KLOR-CON  M) 10 MEQ tablet 2 po bid .  Tome dos tabletas por boca dos veces diarias 360 tablet 0   No current facility-administered medications for this  visit.   Allergies:  Megavite fruits & veggies [actical]   Social History: The patient  reports that she has never smoked. She has never used smokeless tobacco. She reports that she does not drink alcohol and does not use drugs.   Family History: The patient's family history includes Other in her father.   ROS:  Please see the history of present illness. Otherwise, complete review of systems is positive for none  All other systems are reviewed and negative.   Physical Exam: VS:  BP 126/80 (BP Location: Left Arm, Patient Position: Sitting, Cuff Size: Normal)   Pulse 62   Ht 5' 2 (1.575 m)   Wt 149 lb 12.8 oz (67.9 kg)   LMP 07/27/2022 (Approximate)   SpO2 98%   BMI 27.40 kg/m , BMI Body mass index is 27.4 kg/m.  Wt Readings from Last 3 Encounters:  08/04/24 149 lb 12.8 oz (67.9 kg)  07/27/24 148 lb 12 oz (67.5 kg)  06/10/24 147 lb (66.7 kg)    General: Patient appears comfortable at rest. HEENT: Conjunctiva and lids normal, oropharynx clear with moist mucosa. Neck: Supple, no elevated JVP or carotid bruits, no thyromegaly. Lungs: Clear to auscultation, nonlabored breathing at rest. Cardiac: Regular rate and rhythm, no S3 or significant systolic murmur, no pericardial rub. Abdomen: Soft, nontender, no hepatomegaly, bowel sounds present, no guarding or rebound. Extremities: No pitting edema, distal pulses 2+. Skin: Warm and dry. Musculoskeletal: No kyphosis. Neuropsychiatric: Alert  and oriented x3, affect grossly appropriate.  Recent Labwork: 08/18/2023: ALT 21; AST 22 05/19/2024: BUN 11; Creatinine, Ser 0.60; Sodium 137 06/09/2024: Potassium 4.3     Component Value Date/Time   CHOL 192 08/18/2023 1255   TRIG 113 08/18/2023 1255   HDL 51 08/18/2023 1255   CHOLHDL 3.8 08/18/2023 1255   VLDL 23 08/18/2023 1255   LDLCALC 118 (H) 08/18/2023 1255   Assessment and Plan:  Chest pain - Likely secondary to stress - Patient has chest pains at rest, with stress and not with  exertion. - EGD in 2018 showed hypertensive BP response.  Although blood pressures appear to be controlled today, she does not check BPs at home.  Cannot rule out masked hypertension and thus chest pains from poorly controlled HTN. - Obtain exercise Myoview .  Helpful to assess her exercise capacity and BP response with stress.  HTN, controlled - Continue amlodipine  10 mg once daily. - Check blood pressures twice daily in a.m. and p.m.    I spent 45 minutes in reviewing the prior records, tests, labs, results, discussion of her symptoms and HTN and documentation.  Spanish interpreter services used.  I answered all her questions.  Medication Adjustments/Labs and Tests Ordered: Current medicines are reviewed at length with the patient today.  Concerns regarding medicines are outlined above.    Disposition:  Follow up pending results  Signed Jaynell Castagnola Priya Juanelle Trueheart, MD, 08/04/2024 2:27 PM    [facility] at [facility] [address] [place], [place], [place] [address]

## 2024-08-11 ENCOUNTER — Encounter (HOSPITAL_COMMUNITY): Payer: Self-pay

## 2024-08-11 ENCOUNTER — Other Ambulatory Visit: Payer: Self-pay | Admitting: Physician Assistant

## 2024-08-11 ENCOUNTER — Ambulatory Visit (HOSPITAL_COMMUNITY)
Admission: RE | Admit: 2024-08-11 | Discharge: 2024-08-11 | Disposition: A | Discharge status: Home / Self Care | Payer: Self-pay | Source: Ambulatory Visit | Attending: Internal Medicine | Admitting: Internal Medicine

## 2024-08-11 DIAGNOSIS — R079 Chest pain, unspecified: Secondary | ICD-10-CM | POA: Insufficient documentation

## 2024-08-11 LAB — NM MYOCAR MULTI W/SPECT W/WALL MOTION / EF
Angina Index: 0
Base ST Depression (mm): 0 mm
Duke Treadmill Score: 5
Estimated workload: 7
Exercise duration (min): 4 min
Exercise duration (sec): 31 s
LV dias vol: 80 mL (ref 46–106)
LV sys vol: 26 mL (ref 3.8–5.2)
MPHR: 166 {beats}/min
Nuc Stress EF: 67 %
Peak HR: 150 {beats}/min
Percent HR: 90 %
RATE: 0.3
RPE: 11
Rest HR: 57 {beats}/min
Rest Nuclear Isotope Dose: 10.7 mCi
SDS: 0
SRS: 0
SSS: 0
ST Depression (mm): 0 mm
Stress Nuclear Isotope Dose: 29 mCi
TID: 1.1

## 2024-08-11 MED ORDER — REGADENOSON 0.4 MG/5ML IV SOLN
INTRAVENOUS | Status: AC
  Filled 2024-08-11: qty 5

## 2024-08-11 MED ORDER — SODIUM CHLORIDE FLUSH 0.9 % IV SOLN
INTRAVENOUS | Status: AC
  Filled 2024-08-11: qty 10

## 2024-08-11 MED ORDER — TECHNETIUM TC 99M TETROFOSMIN IV KIT
30.0000 | PACK | Freq: Once | INTRAVENOUS | Status: AC | PRN
  Administered 2024-08-11: 29 via INTRAVENOUS

## 2024-08-11 MED ORDER — TECHNETIUM TC 99M TETROFOSMIN IV KIT
10.0000 | PACK | Freq: Once | INTRAVENOUS | Status: AC | PRN
  Administered 2024-08-11: 10.7 via INTRAVENOUS

## 2024-08-11 NOTE — Progress Notes (Signed)
     Sylvia Nguyen presented for a  Exercise Myoview  nuclear stress test today.  I [name], PA-C, provided direct supervision and was present during the stress portion of the study today, which was completed without significant symptoms, immediate complications, or acute ST/T changes on ECG.  Stress imaging is pending at this time.  Preliminary ECG findings may be listed in the chart, but the stress test result will not be finalized until perfusion imaging is complete.  [name], PA-C  08/11/2024, 12:01 PM

## 2024-08-12 ENCOUNTER — Ambulatory Visit: Payer: Self-pay | Admitting: Internal Medicine

## 2024-08-12 ENCOUNTER — Ambulatory Visit: Payer: Self-pay | Admitting: Physician Assistant

## 2024-08-12 NOTE — Telephone Encounter (Signed)
 Left detailed message per DPR. Copied PCP on results

## 2024-08-12 NOTE — Telephone Encounter (Signed)
-----   Message from Vishnu P Mallipeddi sent at 08/12/2024  3:32 PM EDT ----- Normal stress test. ----- Message ----- From: Mallipeddi, Vishnu P, MD Sent: 08/11/2024   3:54 PM EDT To: Vishnu P Mallipeddi, MD

## 2024-08-13 ENCOUNTER — Inpatient Hospital Stay: Payer: Self-pay | Attending: Obstetrics and Gynecology | Admitting: *Deleted

## 2024-08-13 VITALS — BP 126/83 | Wt 149.9 lb

## 2024-08-13 DIAGNOSIS — N644 Mastodynia: Secondary | ICD-10-CM

## 2024-08-13 DIAGNOSIS — Z1239 Encounter for other screening for malignant neoplasm of breast: Secondary | ICD-10-CM

## 2024-08-13 NOTE — Progress Notes (Signed)
 [name] is a 53 y.o. female who presents to [facility] clinic today with complaint of right lower breast pain x 3-4 days that she rated at a 7 out of 10..    Pap Smear: Pap smear not completed today. Last Pap smear was 07/27/2024 at the [facility] and was normal with negative HPV. Per patient has history of an abnormal Pap smear 06/20/2021 that was ASCUS with negative HPV that her most recent Pap smear was her follow up Pap smear. Last Pap smear result is available in Epic.   Physical exam: Breasts Breasts symmetrical. No skin abnormalities bilateral breasts. No nipple retraction bilateral breasts. No nipple discharge bilateral breasts. No lymphadenopathy. No lumps palpated bilateral breasts. Complaints of right outer breast pain on exam.      MS 3D SCR MAMMO BILAT BR (aka MM) Result Date: 07/23/2023 CLINICAL DATA:  Screening. EXAM: DIGITAL SCREENING BILATERAL MAMMOGRAM WITH TOMOSYNTHESIS AND CAD TECHNIQUE: Bilateral screening digital craniocaudal and mediolateral oblique mammograms were obtained. Bilateral screening digital breast tomosynthesis was performed. The images were evaluated with computer-aided detection. COMPARISON:  Previous exam(s). ACR Breast Density Category c: The breasts are heterogeneously dense, which may obscure small masses. FINDINGS: There are no findings suspicious for malignancy. IMPRESSION: No mammographic evidence of malignancy. A result letter of this screening mammogram will be mailed directly to the patient. RECOMMENDATION: Screening mammogram in one year. (Code:SM-B-01Y) BI-RADS CATEGORY  1: Negative. Electronically Signed   By: Delon Music M.D.   On: 07/23/2023 09:02   MS DIGITAL DIAG TOMO UNI LEFT Result Date: 03/04/2022 CLINICAL DATA:  Screening recall for a left breast asymmetry. EXAM: DIGITAL DIAGNOSTIC UNILATERAL LEFT MAMMOGRAM WITH TOMOSYNTHESIS AND CAD; ULTRASOUND LEFT BREAST LIMITED TECHNIQUE: Left digital diagnostic mammography and  breast tomosynthesis was performed. The images were evaluated with computer-aided detection.; Targeted ultrasound examination of the left breast was performed. COMPARISON:  Previous exam(s). ACR Breast Density Category c: The breast tissue is heterogeneously dense, which may obscure small masses. FINDINGS: There is an obscured oval mass in the upper-outer posterior left breast measuring approximately 8 mm. Ultrasound targeted to the right breast at 1 o'clock, 5 cm from the nipple demonstrates an anechoic oval circumscribed mass with increased posterior acoustic enhancement measuring 6 x 5 x 6 mm. IMPRESSION: The mass in the left breast at 1 o'clock corresponds with a benign cyst. RECOMMENDATION: Screening mammogram in one year.(Code:SM-B-01Y) I have discussed the findings and recommendations with the patient. If applicable, a reminder letter will be sent to the patient regarding the next appointment. BI-RADS CATEGORY  2: Benign. Electronically Signed   By: Rosaline Collet M.D.   On: 03/04/2022 15:20   MS DIGITAL SCREENING TOMO BILATERAL Result Date: 02/15/2022 CLINICAL DATA:  Screening. EXAM: DIGITAL SCREENING BILATERAL MAMMOGRAM WITH TOMOSYNTHESIS AND CAD TECHNIQUE: Bilateral screening digital craniocaudal and mediolateral oblique mammograms were obtained. Bilateral screening digital breast tomosynthesis was performed. The images were evaluated with computer-aided detection. COMPARISON:  Previous exam(s). ACR Breast Density Category c: The breast tissue is heterogeneously dense, which may obscure small masses. FINDINGS: In the left breast, a possible asymmetry warrants further evaluation. In the right breast, no findings suspicious for malignancy. IMPRESSION: Further evaluation is suggested for possible asymmetry in the left breast. RECOMMENDATION: Diagnostic mammogram and possibly ultrasound of the left breast. (Code:FI-L-71M) The patient will be contacted regarding the findings, and additional imaging will  be scheduled. BI-RADS CATEGORY  0: Incomplete. Need additional imaging evaluation and/or prior mammograms for comparison. Electronically Signed  By: Alm Pouch III M.D.   On: 02/15/2022 19:21   MS DIGITAL SCREENING TOMO BILATERAL Result Date: 08/15/2020 CLINICAL DATA:  Screening. EXAM: DIGITAL SCREENING BILATERAL MAMMOGRAM WITH TOMO AND CAD COMPARISON:  Previous exam(s). ACR Breast Density Category c: The breast tissue is heterogeneously dense, which may obscure small masses. FINDINGS: There are no findings suspicious for malignancy. Images were processed with CAD. IMPRESSION: No mammographic evidence of malignancy. A result letter of this screening mammogram will be mailed directly to the patient. RECOMMENDATION: Screening mammogram in one year. (Code:SM-B-01Y) BI-RADS CATEGORY  1: Negative. Electronically Signed   By: Elspeth Bathe M.D.   On: 08/15/2020 16:43    Pelvic/Bimanual Pap is not indicated today per BCCCP guidelines.   Smoking History: Patient has never smoked.   Patient Navigation: Patient education provided. Access to services provided for patient through [facility] program. Spanish interpreter Sheree [phone] was provided by [employer].   Colorectal Cancer Screening: Per patient has never had colonoscopy completed. Per patient completed a FIT test given by her PCP in August 2025 that she hasn't received the results. No complaints today.    Breast and Cervical Cancer Risk Assessment: Patient does not have family history of breast cancer, known genetic mutations, or radiation treatment to the chest before age 21. Patient does not have history of cervical dysplasia, immunocompromised, or DES exposure in-utero.  Risk Scores as of Encounter on 08/13/2024     Alisa           5-year 1.16%   Lifetime 8.08%   This patient is [place]/Latina but has no documented birth country, so the [place] model used data from [place] patients to calculate their risk score. Document a birth country in the  Demographics activity for a more accurate score.         Last calculated by Rogerio Tempie SQUIBB, LPN on 1/77/7974 at 11:06 AM       A: BCCCP exam without pap smear Complaint of right breast pain.  P: Referred patient to [facility] for a diagnostic mammogram. Appointment scheduled Tuesday, August 17, 2024 at 1120.  Driscilla Wanda SQUIBB, RN 08/13/2024 10:45 AM

## 2024-08-13 NOTE — Patient Instructions (Signed)
 Explained breast self awareness with Sylvia Nguyen. Patient did not need a Pap smear today due to last Pap smear and HPV typing was 07/27/2024. Let her know BCCCP will cover Pap smears and HPV typing  every 5 years unless has a history of abnormal Pap smears. Referred patient to [facility] for a diagnostic mammogram. Appointment scheduled Tuesday, August 17, 2024 at 1120. Patient aware of appointment and will be there. Sylvia Nguyen verbalized understanding.  Sonali Wivell, Wanda Ship, RN 10:47 AM

## 2024-08-16 ENCOUNTER — Other Ambulatory Visit: Payer: Self-pay | Admitting: Physician Assistant

## 2024-08-16 ENCOUNTER — Encounter: Payer: Self-pay | Admitting: Physician Assistant

## 2024-08-16 ENCOUNTER — Ambulatory Visit: Payer: Self-pay | Admitting: Physician Assistant

## 2024-08-16 DIAGNOSIS — Z789 Other specified health status: Secondary | ICD-10-CM

## 2024-08-16 DIAGNOSIS — K0889 Other specified disorders of teeth and supporting structures: Secondary | ICD-10-CM

## 2024-08-16 MED ORDER — AMOXICILLIN 500 MG PO CAPS
500.0000 mg | ORAL_CAPSULE | Freq: Three times a day (TID) | ORAL | 0 refills | Status: DC
Start: 2024-08-16 — End: 2024-08-16

## 2024-08-16 MED ORDER — AMOXICILLIN 500 MG PO CAPS: 500.0000 mg | ORAL_CAPSULE | Freq: Three times a day (TID) | ORAL | 0 refills | Status: DC

## 2024-08-16 MED ORDER — AMOXICILLIN 500 MG PO CAPS: 500.0000 mg | ORAL_CAPSULE | Freq: Three times a day (TID) | ORAL | 0 refills | Status: AC

## 2024-08-16 NOTE — Progress Notes (Signed)
   LMP 07/27/2022 (Approximate)    Subjective:    Patient ID: Sylvia Nguyen, female    DOB: 1971/11/09, 53 y.o.   MRN: [id number]  HPI: Sylvia Nguyen is a 53 y.o. female presenting on 08/16/2024 for No chief complaint on file.   HPI  Pt says tooth hurting since they took it out over a month ago.   No fever.  Feels bad.  No cough or congestion.  Just the dental pain. Dr  geoffrey  she does not have any additional appointments scheduled with dentist.   She says they sent her to [facility] and they took a piece of her bone out- that was 08/05/24.   She finished antibiotic last Wednesday.   Relevant past medical, surgical, family and social history reviewed and updated as indicated. Interim medical history since our last visit reviewed. Allergies and medications reviewed and updated.   Current Outpatient Medications:    amLODipine  (NORVASC ) 10 MG tablet, Take 1 tablet (10 mg total) by mouth daily. Tome una tableta por boca diaria, Disp: 90 tablet, Rfl: 0   potassium chloride  (KLOR-CON  M) 10 MEQ tablet, 2 po bid .  Tome dos tabletas por boca dos veces diarias, Disp: 360 tablet, Rfl: 0   nitroGLYCERIN  (NITROSTAT ) 0.4 MG SL tablet, Place 1 tablet (0.4 mg total) under the tongue every 5 (five) minutes as needed for chest pain., Disp: 25 tablet, Rfl: 3    Review of Systems  Per HPI unless specifically indicated above     Objective:    LMP 07/27/2022 (Approximate)   Wt Readings from Last 3 Encounters:  08/13/24 149 lb 14.4 oz (68 kg)  08/04/24 149 lb 12.8 oz (67.9 kg)  07/27/24 148 lb 12 oz (67.5 kg)    Physical Exam Constitutional:      General: She is not in acute distress.    Appearance: She is not toxic-appearing.     Comments: Looks like she doesn't feel well  HENT:     Head: Normocephalic and atraumatic.     Mouth/Throat:     Mouth: Mucous membranes are moist.     Dentition: No dental abscesses.     Pharynx: Oropharynx is clear.      Comments: No abscess seen.   Swelling at recent dental work area left upper.  Mild swelling of the face overlying this area.  Speech is normal.  Secretions swallowed normally. No trismus Pulmonary:     Effort: Pulmonary effort is normal. No respiratory distress.  Skin:    General: Skin is warm and dry.  Neurological:     Mental Status: She is alert and oriented to person, place, and time.  Psychiatric:        Behavior: Behavior normal.          Assessment & Plan:    Encounter Diagnoses  Name Primary?   Dentalgia Yes   Not proficient in English language      -Rx amoxil  -Re-refer her to dentist -Follow up as scheduled.  Contact office sooner prn

## 2024-08-17 ENCOUNTER — Ambulatory Visit (HOSPITAL_COMMUNITY)
Admission: RE | Admit: 2024-08-17 | Discharge: 2024-08-17 | Disposition: A | Discharge status: Home / Self Care | Payer: Self-pay | Source: Ambulatory Visit | Attending: Obstetrics and Gynecology | Admitting: Obstetrics and Gynecology

## 2024-08-17 ENCOUNTER — Encounter (HOSPITAL_COMMUNITY): Payer: Self-pay

## 2024-08-17 DIAGNOSIS — N644 Mastodynia: Secondary | ICD-10-CM

## 2024-09-13 ENCOUNTER — Other Ambulatory Visit: Payer: Self-pay | Admitting: Physician Assistant

## 2024-09-13 DIAGNOSIS — E876 Hypokalemia: Secondary | ICD-10-CM

## 2024-09-13 DIAGNOSIS — E785 Hyperlipidemia, unspecified: Secondary | ICD-10-CM

## 2024-09-13 DIAGNOSIS — I10 Essential (primary) hypertension: Secondary | ICD-10-CM

## 2024-09-23 ENCOUNTER — Ambulatory Visit: Payer: Self-pay | Admitting: Physician Assistant

## 2024-09-23 ENCOUNTER — Encounter: Payer: Self-pay | Admitting: Physician Assistant

## 2024-09-23 VITALS — BP 120/78 | HR 76 | Temp 97.7°F

## 2024-09-23 DIAGNOSIS — Z789 Other specified health status: Secondary | ICD-10-CM

## 2024-09-23 DIAGNOSIS — N3090 Cystitis, unspecified without hematuria: Secondary | ICD-10-CM

## 2024-09-23 DIAGNOSIS — E876 Hypokalemia: Secondary | ICD-10-CM

## 2024-09-23 DIAGNOSIS — R10A0 Flank pain, unspecified side: Secondary | ICD-10-CM

## 2024-09-23 DIAGNOSIS — I10 Essential (primary) hypertension: Secondary | ICD-10-CM

## 2024-09-23 LAB — POCT URINALYSIS DIPSTICK
Glucose, UA: NEGATIVE
Nitrite, UA: POSITIVE
Protein, UA: POSITIVE — AB
Spec Grav, UA: 1.025 (ref 1.010–1.025)
Urobilinogen, UA: 1 U/dL
pH, UA: 6 (ref 5.0–8.0)

## 2024-09-23 MED ORDER — CEPHALEXIN 500 MG PO CAPS: 500.0000 mg | ORAL_CAPSULE | Freq: Four times a day (QID) | ORAL | 0 refills | Status: AC

## 2024-09-23 MED ORDER — AMLODIPINE BESYLATE 10 MG PO TABS: 10.0000 mg | ORAL_TABLET | Freq: Every day | ORAL | 0 refills | Status: AC

## 2024-09-23 MED ORDER — POTASSIUM CHLORIDE CRYS ER 10 MEQ PO TBCR: EXTENDED_RELEASE_TABLET | ORAL | 0 refills | Status: AC

## 2024-09-23 NOTE — Progress Notes (Signed)
   BP 120/78   Pulse 76   Temp 97.7 F (36.5 C)   LMP 07/27/2022 (Approximate)   SpO2 98%    Subjective:    Patient ID: Sylvia Nguyen Chard, female    DOB: 05/19/1971, 53 y.o.   MRN: [id number]  HPI: Sylvia Nguyen is a 53 y.o. female presenting on 09/23/2024 for No chief complaint on file.   HPI   Troubles urinating began last Thursday.   Lots of pain in the lower back and urinary frequency.  Otherwise she is doing well.   Relevant past medical, surgical, family and social history reviewed and updated as indicated. Interim medical history since our last visit reviewed. Allergies and medications reviewed and updated.   Current Outpatient Medications:    amLODipine  (NORVASC ) 10 MG tablet, Take 1 tablet (10 mg total) by mouth daily. Tome una tableta por boca diaria, Disp: 90 tablet, Rfl: 0   potassium chloride  (KLOR-CON  M) 10 MEQ tablet, 2 po bid .  Tome dos tabletas por [employer] veces diarias, Disp: 360 tablet, Rfl: 0   nitroGLYCERIN  (NITROSTAT ) 0.4 MG SL tablet, Place 1 tablet (0.4 mg total) under the tongue every 5 (five) minutes as needed for chest pain., Disp: 25 tablet, Rfl: 3    Review of Systems  Per HPI unless specifically indicated above     Objective:    BP 120/78   Pulse 76   Temp 97.7 F (36.5 C)   LMP 07/27/2022 (Approximate)   SpO2 98%   Wt Readings from Last 3 Encounters:  08/13/24 149 lb 14.4 oz (68 kg)  08/04/24 149 lb 12.8 oz (67.9 kg)  07/27/24 148 lb 12 oz (67.5 kg)    Physical Exam Vitals reviewed.  Constitutional:      General: She is not in acute distress.    Appearance: She is well-developed. She is not ill-appearing or toxic-appearing.  HENT:     Head: Normocephalic and atraumatic.  Cardiovascular:     Rate and Rhythm: Normal rate and regular rhythm.  Pulmonary:     Effort: Pulmonary effort is normal.     Breath sounds: Normal breath sounds.  Abdominal:     General: Bowel sounds are normal.     Palpations: Abdomen is soft. There is no  mass.     Tenderness: There is no abdominal tenderness. There is no right CVA tenderness, left CVA tenderness, guarding or rebound.  Musculoskeletal:     Cervical back: Neck supple.     Right lower leg: No edema.     Left lower leg: No edema.  Lymphadenopathy:     Cervical: No cervical adenopathy.  Skin:    General: Skin is warm and dry.  Neurological:     Mental Status: She is alert and oriented to person, place, and time.  Psychiatric:        Behavior: Behavior normal.    UA +      Assessment & Plan:    Encounter Diagnoses  Name Primary?   Flank pain, unspecified laterality Yes   Primary hypertension    Hypokalemia    Cystitis    Not proficient in English language      -rx keflex for uti -pt to get fasting labs drawn.  She will be called with results -pt to follow up in three months.  She is to RTO sooner prn

## 2024-09-28 ENCOUNTER — Ambulatory Visit: Payer: Self-pay | Admitting: Physician Assistant

## 2024-09-29 ENCOUNTER — Other Ambulatory Visit (HOSPITAL_COMMUNITY)
Admission: RE | Admit: 2024-09-29 | Discharge: 2024-09-29 | Disposition: A | Discharge status: Home / Self Care | Payer: Self-pay | Source: Ambulatory Visit | Attending: Physician Assistant | Admitting: Physician Assistant

## 2024-09-29 ENCOUNTER — Ambulatory Visit: Payer: Self-pay | Admitting: Physician Assistant

## 2024-09-29 DIAGNOSIS — I10 Essential (primary) hypertension: Secondary | ICD-10-CM | POA: Insufficient documentation

## 2024-09-29 DIAGNOSIS — E785 Hyperlipidemia, unspecified: Secondary | ICD-10-CM | POA: Insufficient documentation

## 2024-09-29 DIAGNOSIS — E876 Hypokalemia: Secondary | ICD-10-CM | POA: Insufficient documentation

## 2024-09-29 LAB — LIPID PANEL
Cholesterol: 170 mg/dL (ref 0–200)
HDL: 37 mg/dL — ABNORMAL LOW (ref >40)
LDL Cholesterol: 97 mg/dL (ref 0–99)
Total CHOL/HDL Ratio: 4.6 ratio
Triglycerides: 181 mg/dL — ABNORMAL HIGH (ref <150)
VLDL: 36 mg/dL (ref 0–40)

## 2024-09-29 LAB — COMPREHENSIVE METABOLIC PANEL WITH GFR
ALT: 31 U/L (ref 0–44)
AST: 30 U/L (ref 15–41)
Albumin: 4.6 g/dL (ref 3.5–5.0)
Alkaline Phosphatase: 138 U/L — ABNORMAL HIGH (ref 38–126)
Anion gap: 12 (ref 5–15)
BUN: 9 mg/dL (ref 6–20)
CO2: 27 mmol/L (ref 22–32)
Calcium: 9.3 mg/dL (ref 8.9–10.3)
Chloride: 104 mmol/L (ref 98–111)
Creatinine, Ser: 0.67 mg/dL (ref 0.44–1.00)
GFR, Estimated: >60 mL/min (ref >60)
Glucose, Bld: 104 mg/dL — ABNORMAL HIGH (ref 70–99)
Potassium: 3.6 mmol/L (ref 3.5–5.1)
Sodium: 143 mmol/L (ref 135–145)
Total Bilirubin: 0.6 mg/dL (ref 0.0–1.2)
Total Protein: 7.4 g/dL (ref 6.5–8.1)

## 2024-12-07 ENCOUNTER — Other Ambulatory Visit: Payer: Self-pay | Admitting: Physician Assistant

## 2024-12-07 DIAGNOSIS — E876 Hypokalemia: Secondary | ICD-10-CM

## 2024-12-07 DIAGNOSIS — R7309 Other abnormal glucose: Secondary | ICD-10-CM

## 2024-12-07 DIAGNOSIS — I10 Essential (primary) hypertension: Secondary | ICD-10-CM

## 2024-12-28 ENCOUNTER — Ambulatory Visit: Payer: Self-pay | Admitting: Physician Assistant

## 2024-12-28 ENCOUNTER — Other Ambulatory Visit (HOSPITAL_COMMUNITY)
Admission: RE | Admit: 2024-12-28 | Discharge: 2024-12-28 | Disposition: A | Discharge status: Home / Self Care | Payer: Self-pay | Source: Ambulatory Visit | Attending: Physician Assistant | Admitting: Physician Assistant

## 2024-12-28 ENCOUNTER — Encounter: Payer: Self-pay | Admitting: Physician Assistant

## 2024-12-28 VITALS — BP 134/87 | HR 70 | Temp 97.7°F | Ht 62.0 in | Wt 150.0 lb

## 2024-12-28 DIAGNOSIS — I10 Essential (primary) hypertension: Secondary | ICD-10-CM

## 2024-12-28 DIAGNOSIS — E876 Hypokalemia: Secondary | ICD-10-CM

## 2024-12-28 DIAGNOSIS — K0889 Other specified disorders of teeth and supporting structures: Secondary | ICD-10-CM

## 2024-12-28 DIAGNOSIS — Z789 Other specified health status: Secondary | ICD-10-CM

## 2024-12-28 DIAGNOSIS — R7309 Other abnormal glucose: Secondary | ICD-10-CM | POA: Insufficient documentation

## 2024-12-28 LAB — BASIC METABOLIC PANEL WITH GFR
Anion gap: 10 (ref 5–15)
BUN: 11 mg/dL (ref 6–20)
CO2: 29 mmol/L (ref 22–32)
Calcium: 9.4 mg/dL (ref 8.9–10.3)
Chloride: 102 mmol/L (ref 98–111)
Creatinine, Ser: 0.57 mg/dL (ref 0.44–1.00)
GFR, Estimated: >60 mL/min
Glucose, Bld: 136 mg/dL — ABNORMAL HIGH (ref 70–99)
Potassium: 3.3 mmol/L — ABNORMAL LOW (ref 3.5–5.1)
Sodium: 141 mmol/L (ref 135–145)

## 2024-12-28 LAB — HEMOGLOBIN A1C
Hgb A1c MFr Bld: 5.8 % — ABNORMAL HIGH (ref 4.8–5.6)
Mean Plasma Glucose: 119.76 mg/dL

## 2024-12-28 MED ORDER — AMOXICILLIN 500 MG PO CAPS: 500.0000 mg | ORAL_CAPSULE | Freq: Three times a day (TID) | ORAL | 0 refills | Status: AC

## 2024-12-28 NOTE — Progress Notes (Signed)
 "  BP 134/87   Pulse 70   Temp 97.7 F (36.5 C)   Ht 5' 2 (1.575 m)   Wt 150 lb (68 kg)   LMP 07/27/2022   SpO2 98%   BMI 27.44 kg/m    Subjective:    Patient ID: Sylvia Nguyen, female    DOB: May 27, 1971, 54 y.o.   MRN: [id number]  HPI: Sylvia Nguyen is a 54 y.o. female presenting on 12/28/2024 for Hypertension (Pt states her amlodipine  makes her throat feel bitter and dry in the morning when she wakes up. Pt states that the bitter and dry sensation goes away as the day progresses.)   HPI  Chief Complaint  Patient presents with   Hypertension    Pt states her amlodipine  makes her throat feel bitter and dry in the morning when she wakes up. Pt states that the bitter and dry sensation goes away as the day progresses.      Pt is taking her K+ tablets- she is taking only 2 tablets/day even though she knows she is supposed to be taking 4/day.    The dry throat started 15 days ago.    Pain where tooth removed in July -  it has felt bad since that time-  she isn't wearing her partial because it makes the pain worse.     Relevant past medical, surgical, family and social history reviewed and updated as indicated. Interim medical history since our last visit reviewed. Allergies and medications reviewed and updated.   Current Outpatient Medications:    amLODipine  (NORVASC ) 10 MG tablet, Take 1 tablet (10 mg total) by mouth daily. Tome una tableta por boca diaria, Disp: 90 tablet, Rfl: 0   nitroGLYCERIN  (NITROSTAT ) 0.4 MG SL tablet, Place 1 tablet (0.4 mg total) under the tongue every 5 (five) minutes as needed for chest pain., Disp: 25 tablet, Rfl: 3   potassium chloride  (KLOR-CON  M) 10 MEQ tablet, 2 po bid .  Tome dos tabletas por [employer] veces diarias, Disp: 360 tablet, Rfl: 0   Review of Systems  Per HPI unless specifically indicated above     Objective:    BP 134/87   Pulse 70   Temp 97.7 F (36.5 C)   Ht 5' 2 (1.575 m)   Wt 150 lb (68 kg)   LMP 07/27/2022    SpO2 98%   BMI 27.44 kg/m   Wt Readings from Last 3 Encounters:  12/28/24 150 lb (68 kg)  08/13/24 149 lb 14.4 oz (68 kg)  08/04/24 149 lb 12.8 oz (67.9 kg)    Physical Exam Vitals reviewed.  Constitutional:      General: She is not in acute distress.    Appearance: She is well-developed. She is not toxic-appearing.  HENT:     Head: Normocephalic and atraumatic.     Mouth/Throat:     Mouth: Mucous membranes are moist.     Dentition: Abnormal dentition. Dental tenderness present.     Pharynx: Oropharynx is clear. No oropharyngeal exudate.     Comments: Tooth missing upper left molar area which is inflamed and the partial is rubbing the area.  No abscess or drainange Cardiovascular:     Rate and Rhythm: Normal rate and regular rhythm.  Pulmonary:     Effort: Pulmonary effort is normal.     Breath sounds: Normal breath sounds.  Abdominal:     General: Bowel sounds are normal.     Palpations: Abdomen is soft. There is no  mass.     Tenderness: There is no abdominal tenderness.  Musculoskeletal:     Cervical back: Neck supple.     Right lower leg: No edema.     Left lower leg: No edema.  Lymphadenopathy:     Cervical: No cervical adenopathy.  Skin:    General: Skin is warm and dry.  Neurological:     Mental Status: She is alert and oriented to person, place, and time.  Psychiatric:        Behavior: Behavior normal.     Results for orders placed or performed during the hospital encounter of 12/28/24  Basic metabolic panel with GFR   Collection Time: 12/28/24 12:13 PM  Result Value Ref Range   Sodium 141 135 - 145 mmol/L   Potassium 3.3 (L) 3.5 - 5.1 mmol/L   Chloride 102 98 - 111 mmol/L   CO2 29 22 - 32 mmol/L   Glucose, Bld 136 (H) 70 - 99 mg/dL   BUN 11 6 - 20 mg/dL   Creatinine, Ser 9.42 0.44 - 1.00 mg/dL   Calcium 9.4 8.9 - 89.6 mg/dL   GFR, Estimated >39 >39 mL/min   Anion gap 10 5 - 15      Assessment & Plan:    Encounter Diagnoses  Name Primary?    Primary hypertension Yes   Hypokalemia    Dentalgia    Not proficient in English language      -reviewed labs with pt -pt counseled to take K+ as prescribed -pt to continue amlodipine  -pt was counseled that the meds were not causing her dry throat.  Discussed humidity low this time of year and recommended a humidifier or saline nasal spray -Rx amoxil  and send to dentist -F/u here for htn 3 months.  She is to contact office sooner prn  "

## 2024-12-28 NOTE — Patient Instructions (Addendum)
 Use a Humidifier or saline nasal spray to help dry throat Utiliza un humidificador o un spray nasal de solucin salina para aliviar la sequedad de garganta.

## 2025-03-29 ENCOUNTER — Ambulatory Visit: Payer: Self-pay | Admitting: Physician Assistant
